# Patient Record
Sex: Female | Born: 1945 | Race: White | Hispanic: No | Marital: Married | Smoking: Never smoker
Health system: Southern US, Community
[De-identification: ages and names within clinical notes are randomized; demographics above are authoritative.]

## PROBLEM LIST (undated history)

## (undated) DIAGNOSIS — I442 Atrioventricular block, complete: Secondary | ICD-10-CM

## (undated) DIAGNOSIS — E119 Type 2 diabetes mellitus without complications: Secondary | ICD-10-CM

## (undated) DIAGNOSIS — K219 Gastro-esophageal reflux disease without esophagitis: Secondary | ICD-10-CM

## (undated) DIAGNOSIS — I495 Sick sinus syndrome: Secondary | ICD-10-CM

## (undated) DIAGNOSIS — R42 Dizziness and giddiness: Secondary | ICD-10-CM

## (undated) DIAGNOSIS — I447 Left bundle-branch block, unspecified: Secondary | ICD-10-CM

## (undated) DIAGNOSIS — Z45018 Encounter for adjustment and management of other part of cardiac pacemaker: Secondary | ICD-10-CM

## (undated) DIAGNOSIS — R55 Syncope and collapse: Secondary | ICD-10-CM

## (undated) DIAGNOSIS — T50905A Adverse effect of unspecified drugs, medicaments and biological substances, initial encounter: Secondary | ICD-10-CM

## (undated) DIAGNOSIS — T82190A Other mechanical complication of cardiac electrode, initial encounter: Secondary | ICD-10-CM

## (undated) DIAGNOSIS — Z950 Presence of cardiac pacemaker: Secondary | ICD-10-CM

## (undated) DIAGNOSIS — M48061 Spinal stenosis, lumbar region without neurogenic claudication: Secondary | ICD-10-CM

## (undated) DIAGNOSIS — E875 Hyperkalemia: Secondary | ICD-10-CM

## (undated) DIAGNOSIS — R202 Paresthesia of skin: Secondary | ICD-10-CM

## (undated) DIAGNOSIS — I10 Essential (primary) hypertension: Secondary | ICD-10-CM

## (undated) DIAGNOSIS — H409 Unspecified glaucoma: Secondary | ICD-10-CM

## (undated) DIAGNOSIS — F419 Anxiety disorder, unspecified: Secondary | ICD-10-CM

## (undated) DIAGNOSIS — M069 Rheumatoid arthritis, unspecified: Secondary | ICD-10-CM

## (undated) HISTORY — DX: Spinal stenosis, lumbar region without neurogenic claudication: M48.061

## (undated) HISTORY — DX: Atrioventricular block, complete: I44.2

## (undated) HISTORY — DX: Dizziness and giddiness: R42

## (undated) HISTORY — DX: Left bundle-branch block, unspecified: I44.7

## (undated) HISTORY — DX: Presence of cardiac pacemaker: Z95.0

## (undated) HISTORY — DX: Adverse effect of unspecified drugs, medicaments and biological substances, initial encounter: T50.905A

## (undated) HISTORY — DX: Syncope and collapse: R55

## (undated) HISTORY — PX: BLADDER SURGERY: SHX569

## (undated) HISTORY — DX: Other mechanical complication of cardiac electrode, initial encounter: T82.190A

## (undated) HISTORY — DX: Type 2 diabetes mellitus without complications: E11.9

## (undated) HISTORY — DX: Hyperkalemia: E87.5

## (undated) HISTORY — DX: Anxiety disorder, unspecified: F41.9

## (undated) HISTORY — DX: Unspecified glaucoma: H40.9

## (undated) HISTORY — PX: EYE SURGERY: SHX253

## (undated) HISTORY — DX: Essential (primary) hypertension: I10

## (undated) HISTORY — DX: Paresthesia of skin: R20.2

## (undated) HISTORY — DX: Rheumatoid arthritis, unspecified: M06.9

## (undated) HISTORY — DX: Gastro-esophageal reflux disease without esophagitis: K21.9

---
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
## 1999-11-06 ENCOUNTER — Other Ambulatory Visit: Admission: RE | Admit: 1999-11-06 | Discharge: 1999-11-06 | Payer: Self-pay | Admitting: Gynecology

## 2000-12-03 ENCOUNTER — Other Ambulatory Visit: Admission: RE | Admit: 2000-12-03 | Discharge: 2000-12-03 | Payer: Self-pay | Admitting: Obstetrics and Gynecology

## 2000-12-09 ENCOUNTER — Encounter: Payer: Self-pay | Admitting: Obstetrics and Gynecology

## 2000-12-09 ENCOUNTER — Encounter: Admission: RE | Admit: 2000-12-09 | Discharge: 2000-12-09 | Payer: Self-pay | Admitting: Obstetrics and Gynecology

## 2001-12-31 ENCOUNTER — Other Ambulatory Visit: Admission: RE | Admit: 2001-12-31 | Discharge: 2001-12-31 | Payer: Self-pay | Admitting: Obstetrics and Gynecology

## 2002-01-15 ENCOUNTER — Encounter: Payer: Self-pay | Admitting: Urology

## 2002-01-19 ENCOUNTER — Observation Stay (HOSPITAL_COMMUNITY): Admission: RE | Admit: 2002-01-19 | Discharge: 2002-01-20 | Payer: Self-pay | Admitting: Urology

## 2002-03-11 ENCOUNTER — Encounter: Admission: RE | Admit: 2002-03-11 | Discharge: 2002-03-11 | Payer: Self-pay | Admitting: Family Medicine

## 2002-03-11 ENCOUNTER — Encounter: Payer: Self-pay | Admitting: Family Medicine

## 2003-01-19 ENCOUNTER — Other Ambulatory Visit: Admission: RE | Admit: 2003-01-19 | Discharge: 2003-01-19 | Payer: Self-pay | Admitting: Obstetrics and Gynecology

## 2004-01-20 ENCOUNTER — Other Ambulatory Visit: Admission: RE | Admit: 2004-01-20 | Discharge: 2004-01-20 | Payer: Self-pay | Admitting: Obstetrics and Gynecology

## 2005-02-08 ENCOUNTER — Other Ambulatory Visit: Admission: RE | Admit: 2005-02-08 | Discharge: 2005-02-08 | Payer: Self-pay | Admitting: Obstetrics and Gynecology

## 2005-08-22 ENCOUNTER — Emergency Department (HOSPITAL_COMMUNITY): Admission: EM | Admit: 2005-08-22 | Discharge: 2005-08-22 | Payer: Self-pay | Admitting: Emergency Medicine

## 2005-09-12 ENCOUNTER — Encounter: Admission: RE | Admit: 2005-09-12 | Discharge: 2005-09-12 | Payer: Self-pay | Admitting: *Deleted

## 2007-08-25 ENCOUNTER — Encounter: Admission: RE | Admit: 2007-08-25 | Discharge: 2007-08-25 | Payer: Self-pay | Admitting: Gastroenterology

## 2007-09-01 ENCOUNTER — Encounter: Admission: RE | Admit: 2007-09-01 | Discharge: 2007-09-01 | Payer: Self-pay | Admitting: Gastroenterology

## 2008-05-05 ENCOUNTER — Encounter: Admission: RE | Admit: 2008-05-05 | Discharge: 2008-05-05 | Payer: Self-pay | Admitting: Rheumatology

## 2008-05-10 ENCOUNTER — Encounter: Admission: RE | Admit: 2008-05-10 | Discharge: 2008-05-10 | Payer: Self-pay | Admitting: Rheumatology

## 2008-07-28 ENCOUNTER — Encounter: Admission: RE | Admit: 2008-07-28 | Discharge: 2008-07-28 | Payer: Self-pay | Admitting: Unknown Physician Specialty

## 2009-12-04 ENCOUNTER — Emergency Department (HOSPITAL_COMMUNITY): Admission: EM | Admit: 2009-12-04 | Discharge: 2009-12-04 | Payer: Self-pay | Admitting: Emergency Medicine

## 2010-04-25 ENCOUNTER — Encounter
Admission: RE | Admit: 2010-04-25 | Discharge: 2010-05-29 | Payer: Self-pay | Source: Home / Self Care | Attending: Unknown Physician Specialty | Admitting: Unknown Physician Specialty

## 2010-05-08 ENCOUNTER — Encounter
Admission: RE | Admit: 2010-05-08 | Discharge: 2010-05-08 | Payer: Self-pay | Source: Home / Self Care | Attending: Obstetrics and Gynecology | Admitting: Obstetrics and Gynecology

## 2010-08-24 ENCOUNTER — Ambulatory Visit
Admission: RE | Admit: 2010-08-24 | Discharge: 2010-08-24 | Disposition: A | Discharge status: Home / Self Care | Payer: Medicare Other | Source: Ambulatory Visit | Attending: Emergency Medicine | Admitting: Emergency Medicine

## 2010-08-24 ENCOUNTER — Other Ambulatory Visit: Payer: Self-pay | Admitting: Emergency Medicine

## 2010-08-24 DIAGNOSIS — R053 Chronic cough: Secondary | ICD-10-CM

## 2010-08-24 DIAGNOSIS — R05 Cough: Secondary | ICD-10-CM

## 2010-09-14 NOTE — Op Note (Signed)
NAME:  Charlene Barker, Charlene Barker                         ACCOUNT NO.:  [id number]   MEDICAL RECORD NO.:  [id number]                   PATIENT TYPE:  AMB   LOCATION:  DAY                                  FACILITY:  [facility]   PHYSICIAN:  Crecencio Mc, M.D.                    DATE OF BIRTH:  1945/08/10   DATE OF PROCEDURE:  01/19/2002  DATE OF DISCHARGE:                                 OPERATIVE REPORT   PREOPERATIVE DIAGNOSES:  1. Stress urinary incontinence.  2. Anterior vaginal vault prolapse.   POSTOPERATIVE DIAGNOSES:  1. Stress urinary incontinence.  2. Anterior vaginal vault prolapse.   PROCEDURE:  1. Cystoscopy.  2. SPARC urethral sling.  3. Anterior vaginal repair.   SURGEON:  Sigmund I. Patsi Sears, M.D.   ASSISTANT:  Crecencio Mc, M.D.   ANESTHESIA:  General.   COMPLICATIONS:  None.   INDICATIONS FOR PROCEDURE:  [name] is a 65 year old white female who was  recently evaluated in the urology office for complaints of stress urinary  incontinence. The patient was noted to have a positive Gaynell Face test as well  as positive Q-tip test. Urodynamics were performed which demonstrated a  compliant bladder with an abdominal leak point pressure of 60 cm of water.  After discussing treatment options, the patient elected to proceed with a  urethral sling. The potential risks and benefits of this procedure were  explained to the patient and she consented.   DESCRIPTION OF PROCEDURE:  The patient was taken to the operating room and a  general anesthetic was administered. The patient was placed in the dorsal  lithotomy position, administered preoperative antibiotics, and prepped and  draped in the usual sterile fashion. Next a 16 French Foley catheter was  inserted into the bladder, the bladder was drained. The vaginal mucosa was  then injected with 10 cc of 0.5% Marcaine with 1% epinephrine. An incision  was then made over the vaginal mucosa overlying the mid urethra. This  incision was  then carried laterally on either side of the urethra underneath  the vaginal mucosa until the pubic bone could be palpated. Two stab  incisions were then made with a #10 blade, the lower anterior abdominal wall  on either side of the midline just above the pubis. The [facility] trocars were  then passed from the anterior abdominal  wall through the anterior rectus  fascia and behind the pubic bone and carried down through the vaginal  incision under digital guidance. This was also performed on the  contralateral side. The polypropylene urethral sling was then fastened to  these trocars and they were brought back up through the abdominal incision.  A right angle was placed between the sling in the urethra so as to prevent  the sling from being pulled too tightly. The plastic sheathing was then  removed and the sling was cut at the level of the skin. Of  note, cystoscopy  was performed both after placement of the trocars and after the sling was  passed and there was no evidence of any bladder or urethral injury. The  vaginal incision was then closed with a running 3-0 Vicryl stitch. Attention  was then turned to the patient's cystocele which appeared to be a grade 2  cystocele. A #15 blade was used to make a midline incision in the anterior  vaginal mucosa. The vaginal mucosa was then dissected free from the  underlying tissue on either side of the midline and the bladder was able to  be pushed away from the vaginal tissue. Once the dissection was adequate on  both sides laterally as well as posteriorly, interrupted 3-0 Vicryl sutures  were placed into the pubocervical fascia. In addition, a 2 x 7 cm piece of  Tutoplast fascia was placed between the bladder and these interrupted  sutures. The vaginal mucosa was then reapproximated with a running 2-0  Vicryl stitch after a small amount of vaginal mucosa was excised on either  side of the midline incision. A vaginal packing with Estrace cream was  then  placed in the vagina. The patient's Foley catheter was replaced and the stab  incisions in the lower abdomen were closed with Benzoin and Steri-Strips.  The patient appeared to tolerate the procedure well without complications.  She was able to be transferred to the recovery unit in satisfactory  condition. Please note that Dr.  Jethro Bolus was the operating  surgeon and was present and participated in this entire procedure.                                                Crecencio Mc, M.D.    LB/MEDQ  D:  01/19/2002  T:  01/19/2002  Job:  [id number]

## 2011-06-25 DIAGNOSIS — H04129 Dry eye syndrome of unspecified lacrimal gland: Secondary | ICD-10-CM | POA: Diagnosis not present

## 2011-06-25 DIAGNOSIS — H4011X0 Primary open-angle glaucoma, stage unspecified: Secondary | ICD-10-CM | POA: Diagnosis not present

## 2011-06-25 DIAGNOSIS — H409 Unspecified glaucoma: Secondary | ICD-10-CM | POA: Diagnosis not present

## 2011-07-17 DIAGNOSIS — I10 Essential (primary) hypertension: Secondary | ICD-10-CM | POA: Diagnosis not present

## 2011-07-17 DIAGNOSIS — E559 Vitamin D deficiency, unspecified: Secondary | ICD-10-CM | POA: Diagnosis not present

## 2011-07-17 DIAGNOSIS — E785 Hyperlipidemia, unspecified: Secondary | ICD-10-CM | POA: Diagnosis not present

## 2011-07-17 DIAGNOSIS — R42 Dizziness and giddiness: Secondary | ICD-10-CM | POA: Diagnosis not present

## 2011-07-17 DIAGNOSIS — R55 Syncope and collapse: Secondary | ICD-10-CM | POA: Diagnosis not present

## 2011-07-17 DIAGNOSIS — Z79899 Other long term (current) drug therapy: Secondary | ICD-10-CM | POA: Diagnosis not present

## 2011-07-19 DIAGNOSIS — I10 Essential (primary) hypertension: Secondary | ICD-10-CM | POA: Diagnosis not present

## 2011-07-19 DIAGNOSIS — R55 Syncope and collapse: Secondary | ICD-10-CM | POA: Diagnosis not present

## 2011-07-19 DIAGNOSIS — I447 Left bundle-branch block, unspecified: Secondary | ICD-10-CM | POA: Diagnosis not present

## 2011-07-23 ENCOUNTER — Encounter: Payer: Self-pay | Admitting: Internal Medicine

## 2011-07-29 ENCOUNTER — Ambulatory Visit (INDEPENDENT_AMBULATORY_CARE_PROVIDER_SITE_OTHER): Payer: TRICARE For Life (TFL) | Admitting: Internal Medicine

## 2011-07-29 ENCOUNTER — Encounter: Payer: Self-pay | Admitting: Internal Medicine

## 2011-07-29 VITALS — BP 126/74 | HR 71 | Ht 64.0 in | Wt 137.0 lb

## 2011-07-29 DIAGNOSIS — R55 Syncope and collapse: Secondary | ICD-10-CM | POA: Diagnosis not present

## 2011-07-29 DIAGNOSIS — R42 Dizziness and giddiness: Secondary | ICD-10-CM

## 2011-07-29 DIAGNOSIS — I447 Left bundle-branch block, unspecified: Secondary | ICD-10-CM

## 2011-07-29 NOTE — Assessment & Plan Note (Signed)
We will anticipate undertaken it exercise test to see if we can elucidate what is The hemodynamics and heart rhythm associated with her dizziness. She has dizziness even at rest. The cause of this is not clear. It was unassociated with blood pressure and/or heart rate abnormalities today.

## 2011-07-29 NOTE — Progress Notes (Signed)
CARDIOLOGY CONSULT NOTE  Patient ID: Charlene Barker, MRN: [id number], DOB/AGE: [birth year] 66 y.o. Admit date: (Not on file) Date of Consult: 07/29/2011  Primary Physician: Alva Garnet., MD, MD Primary Cardiologist: Ottis Stain  Chief Complaint: Syncope   HPI Charlene Barker is a 66 y.o. female : seen for syncope    Has longstanding history of LBBB and 3-5 yr history of increasingly frequent syncope  Abrupt in onset and offset and very brief in duration  Now 3 spells in the last three months  Eval has incl an echo prelim by Dr Ottis Stain office "normal"  Also has history of exercise assoc LH and Palplitations  The patient denies chest pain, shortness of breath, nocturnal dyspnea, orthopnea or peripheral edema.  T   She has hx of glaucoma and on multiple meds for this    Past Medical History  Diagnosis Date  . HTN (hypertension)   . Syncope       Surgical History:  Past Surgical History  Procedure Date  . Bladder surgery      Home Meds: Prior to Admission medications   Medication Sig Start Date End Date Taking? Authorizing Provider  Ascorbic Acid (VITAMIN C) 1000 MG tablet Take 1,000 mg by mouth daily.   Yes Historical Provider, MD  aspirin 81 MG tablet Take 81 mg by mouth daily.   Yes Historical Provider, MD  Biotin 1000 MCG tablet Take 1,000 mcg by mouth daily.   Yes Historical Provider, MD  Cholecalciferol (VITAMIN D3) 2000 UNITS capsule Take 2,000 Units by mouth daily.   Yes Historical Provider, MD  dorzolamide (TRUSOPT) 2 % ophthalmic solution 1 drop as directed.   Yes Historical Provider, MD  ibuprofen (ADVIL,MOTRIN) 200 MG tablet Take 200 mg by mouth as needed.   Yes Historical Provider, MD  latanoprost (XALATAN) 0.005 % ophthalmic solution 1 drop as directed.   Yes Historical Provider, MD  losartan (COZAAR) 50 MG tablet Take 50 mg by mouth daily.   Yes Historical Provider, MD  meclizine (ANTIVERT) 25 MG tablet Take 25 mg by mouth as needed.   Yes Historical Provider, MD    Multiple Vitamin (MULTIVITAMIN) capsule Take 1 capsule by mouth daily.   Yes Historical Provider, MD  Propylene Glycol (SYSTANE BALANCE) 0.6 % SOLN Apply to eye as directed.   Yes Historical Provider, MD  timolol (BETIMOL) 0.5 % ophthalmic solution 1 drop as directed.   Yes Historical Provider, MD    Inpatient Medications:     Allergies:  Allergies  Allergen Reactions  . Penicillins   . Sulfa Drugs Cross Reactors     History   Social History  . Marital Status: Married    Spouse Name: N/A    Number of Children: N/A  . Years of Education: N/A   Occupational History  . Not on file.   Social History Main Topics  . Smoking status: Never Smoker   . Smokeless tobacco: Not on file  . Alcohol Use: Not on file  . Drug Use: No  . Sexually Active: Not on file   Other Topics Concern  . Not on file   Social History Narrative  . No narrative on file     Family History  Problem Relation Age of Onset  . Leukemia Mother   . Stroke Father      ROS:  Please see the history of present illness.   Negative except anxiety allergies  All other systems reviewed and negative.    Physical Exam: Blood pressure  126/74, pulse 71, height 5\' 4"  (1.626 m), weight 137 lb (62.143 kg). General: Well developed, well nourished female in no acute distress. Head: Normocephalic, atraumatic, sclera non-icteric, no xanthomas, nares are without discharge. Lymph Nodes:  none Neck: Negative for carotid bruits. JVD not elevated. Lungs: Clear bilaterally to auscultation without wheezes, rales, or rhonchi. Breathing is unlabored. Heart: RRR with S1 S2 broadly split . No murmurs, rubs, or gallops appreciated. Abdomen: Soft, non-tender, non-distended with normoactive bowel sounds. No hepatomegaly. No rebound/guarding. No obvious abdominal masses. Msk:  Strength and tone appear normal for age. Extremities: No clubbing or cyanosis. No edema.  Distal pedal pulses are 2+ and equal bilaterally. Skin: Warm and  Dry Neuro: Alert and oriented X 3. CN III-XII intact Grossly normal sensory and motor function . Psych:  Responds to questions appropriately with a normal affect.      Labs:  EKG: LBBB  NSR    Assessment and Plan:  [name]

## 2011-07-29 NOTE — Assessment & Plan Note (Signed)
She has abrupt onset and offset very brief episodes of syncope and loss of postural tone. In the context of her left bundle branch block, question must be raised as to whether she has intermittent complete heart block. These episodes are increasingly frequent and I hope to be able to identify an episode with an event recorder. If this is not illuminating, we will undertake an implantable device.

## 2011-07-29 NOTE — Assessment & Plan Note (Signed)
As above.

## 2011-07-29 NOTE — Patient Instructions (Signed)
Your physician has requested that you have an exercise tolerance test. For further information please visit [email or web address] Please also follow instruction sheet, as given.    Your physician has recommended that you wear an event monitor. Event monitors are medical devices that record the heart's electrical activity. Doctors most often [place] these monitors to diagnose arrhythmias. Arrhythmias are problems with the speed or rhythm of the heartbeat. The monitor is a small, portable device. You can wear one while you do your normal daily activities. This is usually used to diagnose what is causing palpitations/syncope (passing out).----Charlene Barker of HEART[facility] single event

## 2011-08-08 ENCOUNTER — Encounter (INDEPENDENT_AMBULATORY_CARE_PROVIDER_SITE_OTHER): Payer: Medicare Other

## 2011-08-08 DIAGNOSIS — R55 Syncope and collapse: Secondary | ICD-10-CM | POA: Diagnosis not present

## 2011-08-20 DIAGNOSIS — R55 Syncope and collapse: Secondary | ICD-10-CM | POA: Diagnosis not present

## 2011-08-20 DIAGNOSIS — I447 Left bundle-branch block, unspecified: Secondary | ICD-10-CM | POA: Diagnosis not present

## 2011-08-20 DIAGNOSIS — I10 Essential (primary) hypertension: Secondary | ICD-10-CM | POA: Diagnosis not present

## 2011-08-23 DIAGNOSIS — R42 Dizziness and giddiness: Secondary | ICD-10-CM | POA: Diagnosis not present

## 2011-08-26 ENCOUNTER — Ambulatory Visit (INDEPENDENT_AMBULATORY_CARE_PROVIDER_SITE_OTHER): Payer: BC Managed Care – PPO | Admitting: Internal Medicine

## 2011-08-26 ENCOUNTER — Encounter: Payer: Self-pay | Admitting: Internal Medicine

## 2011-08-26 DIAGNOSIS — R55 Syncope and collapse: Secondary | ICD-10-CM

## 2011-08-26 NOTE — Procedures (Signed)
Exercise Treadmill Test  Pre-Exercise Testing Evaluation Rhythm: normal sinus  Rate: 78   PR:  .17 QRS:  .12  QT:  .40 QTc: .46     Test  Exercise Tolerance Test Ordering MD: [name], MD  Interpreting MD:  [name], MD  Unique Test No: 1  Treadmill:  1  Indication for ETT: Syncope  Contraindication to ETT: No   Stress Modality: exercise - treadmill  Cardiac Imaging Performed: non   Protocol: standard Bruce - maximal  Max BP: 174/66  Max MPHR (bpm):  154 85% MPR (bpm):  131  MPHR obtained (bpm):  137 % MPHR obtained:  88%  Reached 85% MPHR (min:sec):  3:20 Total Exercise Time (min-sec):  4:09  Workload in METS:  7.1 Borg Scale: 15  Reason ETT Terminated:  unable to continue  with some lightheadedness    ST Segment Analysis At Rest: significant ST depression making ST analysis non-diagnostic With Exercise: non-specific ST changes  Other Information Arrhythmia:  occ pvc Angina during ETT:  absent (0) Quality of ETT:  diagnostic  ETT Interpretation:  Increased ventricular ectopy w exercise  Comments: pvc density low but emerged with exercise and couplet seen at peak exercise which was alsoassociated with inability to sustain increasing blood pressure during exercise  Recommendations: Need to exclude coronary artery disease  Will discuss with Dr Ottis Stain Donnie Mesa or cath

## 2011-09-10 ENCOUNTER — Telehealth: Payer: Self-pay | Admitting: Physician Assistant

## 2011-09-10 ENCOUNTER — Encounter: Payer: Self-pay | Admitting: Family Medicine

## 2011-09-10 ENCOUNTER — Ambulatory Visit (INDEPENDENT_AMBULATORY_CARE_PROVIDER_SITE_OTHER): Payer: BC Managed Care – PPO | Admitting: Family Medicine

## 2011-09-10 VITALS — BP 145/77 | HR 78 | Temp 97.8°F | Resp 16 | Ht 63.5 in | Wt 147.0 lb

## 2011-09-10 DIAGNOSIS — R55 Syncope and collapse: Secondary | ICD-10-CM

## 2011-09-10 DIAGNOSIS — H409 Unspecified glaucoma: Secondary | ICD-10-CM | POA: Diagnosis not present

## 2011-09-10 DIAGNOSIS — H04129 Dry eye syndrome of unspecified lacrimal gland: Secondary | ICD-10-CM | POA: Diagnosis not present

## 2011-09-10 DIAGNOSIS — H4011X0 Primary open-angle glaucoma, stage unspecified: Secondary | ICD-10-CM | POA: Diagnosis not present

## 2011-09-10 LAB — GLUCOSE, POCT (MANUAL RESULT ENTRY): POC Glucose: 107

## 2011-09-10 NOTE — Telephone Encounter (Signed)
Pt called because she had a syncopal spell. She turned in her monitor 2 days ago. She had a brief prodrome and was able to keep from falling. She feels generally bad now and is at Urgent Care.  Advised her to let them see her and they will call us if anything needs to be addressed acutely. Otherwise, keep appointment with Dr Graciela Husbands and call us if any other symptoms. Requested she not drive.

## 2011-09-10 NOTE — Progress Notes (Signed)
  Subjective:    Patient ID: Charlene Barker, female    DOB: [birth year], 66 y.o.   MRN: [id number]  HPI 66 yo female following with DR. Graciela Husbands of [facility] for frequent and recurring syncopal episodes, LBBB.  They are trying to determine the cause of her spells.  Did wear a monitor but turned it in yesterday.  Has follow-up appointment with Dr. Graciela Husbands on Thursday (day after tomrrow, in the morning). Today, when getting out of the car she had the spell.  VEry brief.  "almost over as soon as it begins" but feels dizzy and lightheaded and "goes down" but doesn't fully lose consciousness because so brief.   Has felt off this afternoon. Did go into [place] for eye appt with her husband.  Had not had dinner yet.   No pain or shortness of breath.    Review of Systems Negative except as per HPI     Objective:   Physical Exam  Constitutional: She appears well-developed and well-nourished.  Cardiovascular: Normal rate, regular rhythm, normal heart sounds and intact distal pulses.   No murmur heard. Pulmonary/Chest: Effort normal and breath sounds normal.  Neurological: She is alert.  Skin: Skin is warm and dry.    Results for orders placed in visit on 09/10/11  GLUCOSE, POCT (MANUAL RESULT ENTRY)      Component Value Range   POC Glucose 107      EKG - LBBB and LAD     Assessment & Plan:  Syncope - not new.  In middle of thorough work-up with cardiology with follow-up scheduled in less than 48 hours.  No driving until seen by cards.  Nothing acute seen tonight. Keep scheduled follow-up with Dr. Graciela Husbands.

## 2011-09-11 ENCOUNTER — Encounter: Payer: Self-pay | Admitting: Internal Medicine

## 2011-09-11 ENCOUNTER — Ambulatory Visit (INDEPENDENT_AMBULATORY_CARE_PROVIDER_SITE_OTHER): Payer: BC Managed Care – PPO | Admitting: Internal Medicine

## 2011-09-11 VITALS — BP 110/66 | HR 66 | Ht 64.0 in | Wt 139.0 lb

## 2011-09-11 DIAGNOSIS — I447 Left bundle-branch block, unspecified: Secondary | ICD-10-CM | POA: Diagnosis not present

## 2011-09-11 DIAGNOSIS — R55 Syncope and collapse: Secondary | ICD-10-CM

## 2011-09-11 NOTE — Progress Notes (Signed)
  HPI  Charlene Barker is a 65 y.o. female SEEN in followup for  longstanding history of LBBB and 3-5 yr history of increasingly frequent syncope  Abrupt in onset and offset and very brief in duration  Now 3 spells in the last three months  2013 echo prelim by Dr JG office "normal"    Event recorder demonstarated only PVCs with LH but no severe or typical spells while wearing it  Review of spells includes positions are variable, residual fatigue and orthostatic intolerance is common    Past Medical History  Diagnosis Date  . HTN (hypertension)   . Syncope   . Left bundle branch block   . Lightheadedness     Associated with exercise    Past Surgical History  Procedure Date  . Bladder surgery     Current Outpatient Prescriptions  Medication Sig Dispense Refill  . Ascorbic Acid (VITAMIN C) 1000 MG tablet Take 1,000 mg by mouth daily.      . aspirin 81 MG tablet Take 81 mg by mouth daily.      . Biotin 1000 MCG tablet Take 1,000 mcg by mouth daily.      . Cholecalciferol (VITAMIN D3) 2000 UNITS capsule Take 2,000 Units by mouth daily.      . dorzolamide (TRUSOPT) 2 % ophthalmic solution 1 drop as directed.      . Green Coffee Bean 400 MG CAPS Take 1 tablet by mouth.      . ibuprofen (ADVIL,MOTRIN) 200 MG tablet Take 200 mg by mouth as needed.      . latanoprost (XALATAN) 0.005 % ophthalmic solution 1 drop as directed.      . losartan (COZAAR) 50 MG tablet Take 50 mg by mouth daily.      . meclizine (ANTIVERT) 25 MG tablet Take 25 mg by mouth as needed.      . Multiple Vitamin (MULTIVITAMIN) capsule Take 1 capsule by mouth daily.      . Propylene Glycol (SYSTANE BALANCE) 0.6 % SOLN Apply to eye as directed.      . timolol (BETIMOL) 0.5 % ophthalmic solution 1 drop as directed.        Allergies  Allergen Reactions  . Penicillins   . Sulfa Drugs Cross Reactors     Review of Systems negative except from HPI and PMH  Physical Exam BP 110/66  Pulse 66  Ht 5' 4" (1.626 m)   Wt 139 lb (63.05 kg)  BMI 23.86 kg/m2 Well developed and well nourished in no acute distress HENT normal E scleral and icterus clear Neck Supple JVP flat; carotids brisk and full Clear to ausculation Regular rate and rhythm, no murmurs gallops or rub Soft with active bowel sounds No clubbing cyanosis none Edema Alert and oriented, grossly normal motor and sensory function Skin Warm and Dry  Tele  NSR with PVC both pre and post symptom mark  Assessment and  Plan  

## 2011-09-11 NOTE — Assessment & Plan Note (Signed)
We discussed multiple options -- carotid sinus massage >>3sec pause and she also had passed out with Dr Ottis Stain with [facility] but her symtpoms are not consistent with this diagnosis so I would still favor loop recorder  We discussed these issue for almost 45 minutes  She will considr loop implant and we will followupwith her in the am

## 2011-09-11 NOTE — Assessment & Plan Note (Signed)
As above  ILR prior to pacing    Risks disucssed including infection

## 2011-09-12 ENCOUNTER — Ambulatory Visit: Payer: BC Managed Care – PPO | Admitting: Internal Medicine

## 2011-09-12 ENCOUNTER — Encounter: Payer: Self-pay | Admitting: *Deleted

## 2011-09-12 ENCOUNTER — Telehealth: Payer: Self-pay | Admitting: Internal Medicine

## 2011-09-12 DIAGNOSIS — R55 Syncope and collapse: Secondary | ICD-10-CM

## 2011-09-12 NOTE — Telephone Encounter (Signed)
New msg Pt was here yesterday and wanted to talk to you. Please call

## 2011-09-12 NOTE — Telephone Encounter (Signed)
I called and spoke with the patient to set up her loop recorder. She is scheduled for 5/24 at 12:30 pm with Dr. Graciela Husbands. She will come on 5/20 for labwork.

## 2011-09-13 ENCOUNTER — Encounter (HOSPITAL_COMMUNITY): Payer: Self-pay | Admitting: Pharmacy Technician

## 2011-09-16 ENCOUNTER — Other Ambulatory Visit (INDEPENDENT_AMBULATORY_CARE_PROVIDER_SITE_OTHER): Payer: BC Managed Care – PPO

## 2011-09-16 DIAGNOSIS — R55 Syncope and collapse: Secondary | ICD-10-CM | POA: Diagnosis not present

## 2011-09-16 LAB — BASIC METABOLIC PANEL
BUN: 15 mg/dL (ref 6–23)
CO2: 29 mEq/L (ref 19–32)
Calcium: 9.7 mg/dL (ref 8.4–10.5)
Chloride: 103 mEq/L (ref 96–112)
Creatinine, Ser: 0.7 mg/dL (ref 0.4–1.2)
GFR: 86 mL/min (ref >60.00)
Glucose, Bld: 117 mg/dL — ABNORMAL HIGH (ref 70–99)
Potassium: 4.9 mEq/L (ref 3.5–5.1)
Sodium: 139 mEq/L (ref 135–145)

## 2011-09-16 LAB — CBC WITH DIFFERENTIAL/PLATELET
Basophils Absolute: 0.1 10*3/uL (ref 0.0–0.1)
Basophils Relative: 0.8 % (ref 0.0–3.0)
Eosinophils Absolute: 0.3 10*3/uL (ref 0.0–0.7)
Eosinophils Relative: 4.5 % (ref 0.0–5.0)
HCT: 39.1 % (ref 36.0–46.0)
Hemoglobin: 13 g/dL (ref 12.0–15.0)
Lymphocytes Relative: 28.9 % (ref 12.0–46.0)
Lymphs Abs: 2.1 10*3/uL (ref 0.7–4.0)
MCHC: 33.1 g/dL (ref 30.0–36.0)
MCV: 94.5 fl (ref 78.0–100.0)
Monocytes Absolute: 0.6 10*3/uL (ref 0.1–1.0)
Monocytes Relative: 8.6 % (ref 3.0–12.0)
Neutro Abs: 4.1 10*3/uL (ref 1.4–7.7)
Neutrophils Relative %: 57.2 % (ref 43.0–77.0)
Platelets: 275 10*3/uL (ref 150.0–400.0)
RBC: 4.14 Mil/uL (ref 3.87–5.11)
RDW: 12.3 % (ref 11.5–14.6)
WBC: 7.2 10*3/uL (ref 4.5–10.5)

## 2011-09-16 LAB — PROTIME-INR
INR: 0.9 ratio (ref 0.8–1.0)
Prothrombin Time: 10.3 s (ref 10.2–12.4)

## 2011-09-18 ENCOUNTER — Telehealth: Payer: Self-pay | Admitting: Internal Medicine

## 2011-09-18 NOTE — Telephone Encounter (Signed)
Patient called states forgot to tell Dr.Klein she takes ear gtts.prn.States takes neomycin polymyxin b sulfates dexamethasone opthalmic suspension applies 4 gtts in each ear prn.

## 2011-09-18 NOTE — Telephone Encounter (Signed)
New msg Pt wants to talk to you about med she uses for her ears. She is having procedure-loop recorder on Friday. She wanted to add to her list of meds.

## 2011-09-19 ENCOUNTER — Telehealth: Payer: Self-pay | Admitting: Internal Medicine

## 2011-09-19 ENCOUNTER — Other Ambulatory Visit: Payer: Self-pay | Admitting: Internal Medicine

## 2011-09-19 DIAGNOSIS — R55 Syncope and collapse: Secondary | ICD-10-CM

## 2011-09-19 NOTE — Telephone Encounter (Signed)
New problem:  Patient calling, wants to discuss upcoming procedure & pre-op clearance

## 2011-09-19 NOTE — Telephone Encounter (Signed)
error 

## 2011-09-20 ENCOUNTER — Ambulatory Visit (HOSPITAL_COMMUNITY)
Admission: RE | Admit: 2011-09-20 | Discharge: 2011-09-20 | Disposition: A | Discharge status: Home / Self Care | Payer: BC Managed Care – PPO | Source: Ambulatory Visit | Attending: Internal Medicine | Admitting: Internal Medicine

## 2011-09-20 ENCOUNTER — Encounter (HOSPITAL_COMMUNITY)
Admission: RE | Disposition: A | Discharge status: Home / Self Care | Payer: Self-pay | Source: Ambulatory Visit | Attending: Internal Medicine

## 2011-09-20 ENCOUNTER — Ambulatory Visit (HOSPITAL_COMMUNITY): Payer: BC Managed Care – PPO

## 2011-09-20 DIAGNOSIS — R55 Syncope and collapse: Secondary | ICD-10-CM | POA: Insufficient documentation

## 2011-09-20 DIAGNOSIS — I447 Left bundle-branch block, unspecified: Secondary | ICD-10-CM | POA: Insufficient documentation

## 2011-09-20 DIAGNOSIS — I10 Essential (primary) hypertension: Secondary | ICD-10-CM | POA: Diagnosis not present

## 2011-09-20 HISTORY — PX: LOOP RECORDER IMPLANT: SHX5477

## 2011-09-20 LAB — SURGICAL PCR SCREEN
MRSA, PCR: POSITIVE — AB
Staphylococcus aureus: POSITIVE — AB

## 2011-09-20 SURGERY — LOOP RECORDER IMPLANT
Anesthesia: LOCAL

## 2011-09-20 MED ORDER — VANCOMYCIN HCL IN DEXTROSE 1-5 GM/200ML-% IV SOLN
INTRAVENOUS | Status: AC
  Filled 2011-09-20: qty 200

## 2011-09-20 MED ORDER — CHLORHEXIDINE GLUCONATE 4 % EX LIQD: 60.0000 mL | Freq: Once | CUTANEOUS | Status: DC

## 2011-09-20 MED ORDER — MUPIROCIN 2 % EX OINT
TOPICAL_OINTMENT | Freq: Two times a day (BID) | CUTANEOUS | Status: DC
  Administered 2011-09-20: 12:00:00 via NASAL
  Filled 2011-09-20: qty 22

## 2011-09-20 MED ORDER — VANCOMYCIN HCL IN DEXTROSE 1-5 GM/200ML-% IV SOLN
1000.0000 mg | INTRAVENOUS | Status: DC
  Filled 2011-09-20: qty 200

## 2011-09-20 MED ORDER — SODIUM CHLORIDE 0.9 % IR SOLN
80.0000 mg | Status: DC
  Filled 2011-09-20: qty 2

## 2011-09-20 MED ORDER — MUPIROCIN 2 % EX OINT
TOPICAL_OINTMENT | CUTANEOUS | Status: AC
  Filled 2011-09-20: qty 22

## 2011-09-20 MED ORDER — MIDAZOLAM HCL 2 MG/2ML IJ SOLN
INTRAMUSCULAR | Status: AC
  Filled 2011-09-20: qty 2

## 2011-09-20 MED ORDER — SODIUM CHLORIDE 0.45 % IV SOLN: INTRAVENOUS | Status: AC

## 2011-09-20 MED ORDER — FENTANYL CITRATE 0.05 MG/ML IJ SOLN
INTRAMUSCULAR | Status: AC
  Filled 2011-09-20: qty 2

## 2011-09-20 NOTE — CV Procedure (Signed)
Preop Dx: synope Postop Dx: same    LOOP RECORDER IMPLANT   After routine prep and drape of the left upper chest, lidocaine was infiltrated lateral to the sternum and caudal to the medial aspect of the clavicle. An incision was made and carried down to the layer of the prepectoral fascia and a Paco is formed. Hemostasis was obtained  2 2-0 silk sutures were placed at the cephalad aspect of the pocket and used to secure a reveal XT Medtronic loop recorder serial number [id number]. The pocket was copiously irrigated with antibiotic containing saline solution hemostasis was assured and the pocket was closed in 3 layers in the normal fashion.  The wound was washed dried and a benzoin Steri-Strip this was applied needle counts sponge counts and instrument counts were correct at the end of the procedure according to the staff. The patient tolerated the procedure without apparent complication.

## 2011-09-20 NOTE — Discharge Instructions (Signed)
LET YOUR CAREGIVER KNOW ABOUT:   Symptoms of chest pain, trouble breathing, palpitations, lightheadedness, or feelings of an abnormal or irregular heart beat.   Allergies.   Medications taken including herbs, eye drops, over the counter medications, and creams   Use of steroids (by mouth or creams).   Possible pregnancy, if applicable.   Previous problems with anesthetics or Novocaine.   History of blood clots (thrombophlebitis).   History of bleeding or blood problems.   Surgery since your last pacemaker placement.   Other health problems.  RISKS AND COMPLICATIONS These are very uncommon but include:  Bleeding.   Bruising of the skin around where the incision was made.   Pain at the site of the incision.   Pulling apart of the skin at the incision site.   Infection.   Allergic reaction to anesthetics or medicines used during the procedure.  Diabetics may have a temporary increase in their blood sugar after any surgical procedure.  BEFORE THE PROCEDURE  Wash all of the skin around the area of the chest where the pacemaker is located. Try to remove any loose, scaling skin. Unless advised otherwise, avoid using aspirin, ibuprofen, or naproxen for 3-4 days before the procedure. Ask your caregiver for help with any other medication adjustments before the pacemaker is replaced. Unless advised otherwise, do not eat or drink after midnight on the night before the procedure EXCEPT for drinking water and taking your medications as you normally would. AFTER THE PROCEDURE   A heart monitor and the pacemaker programmer will be used to make sure that the new pacemaker is working properly.   You can go home after the procedure.   Your caregiver will advise you if you need to have any stitches. They will be removed 5-7 days after the procedure.  HOME CARE INSTRUCTIONS   Keep the incision clean and dry.   Unless advised otherwise, you may shower after carefully covering the incision  with plastic wrap that is taped to your chest.   For the first week after the replacement, avoid stretching motions that pull at the incision site and avoid heavy exercise with the arm on the same side as the incision.   Only take over-the-counter or prescription medicines for pain, discomfort, or fever as directed by your caregiver.   Your caregiver will tell you when you will need to next test your pacemaker by telephone or when to return to the office for re-exam and/or removal of stitches, if necessary.  SEEK MEDICAL CARE IF:   You have unusual pain at the incision site that is not adequately helped by over-the-counter or prescription medicine.   There is drainage or pus from the incision site.   You develop red streaking that extends above or below the incision site.   You feel brief intermittent palpitations, lightheadedness or any symptoms that you feel might be related to your heart.  SEEK IMMEDIATE MEDICAL CARE IF:   You experience chest pain that is different than the pain at the incision site.   You experience:   Shortness of breath.   Palpitations.   Irregular heart beat.   Lightheadedness that does not go away quickly.   Fainting.   You develop a fever.   You have pain that gets worse even though you are taking pain medicine.  MAKE SURE YOU:   Understand these instructions.   Will watch your condition.   Will get help right away if you are not doing well or get worse.  Document Released: 07/24/2006 Document Revised: 04/04/2011 Document Reviewed: 10/27/2006 [facility] Patient Information 2012 [place], [place].

## 2011-09-20 NOTE — H&P (View-Only) (Signed)
  HPI  Charlene Barker is a 66 y.o. female SEEN in followup for  longstanding history of LBBB and 3-5 yr history of increasingly frequent syncope  Abrupt in onset and offset and very brief in duration  Now 3 spells in the last three months  2013 echo prelim by Dr Ottis Stain office "normal"    Event recorder demonstarated only PVCs with LH but no severe or typical spells while wearing it  Review of spells includes positions are variable, residual fatigue and orthostatic intolerance is common    Past Medical History  Diagnosis Date  . HTN (hypertension)   . Syncope   . Left bundle branch block   . Lightheadedness     Associated with exercise    Past Surgical History  Procedure Date  . Bladder surgery     Current Outpatient Prescriptions  Medication Sig Dispense Refill  . Ascorbic Acid (VITAMIN C) 1000 MG tablet Take 1,000 mg by mouth daily.      Marland Kitchen aspirin 81 MG tablet Take 81 mg by mouth daily.      . Biotin 1000 MCG tablet Take 1,000 mcg by mouth daily.      . Cholecalciferol (VITAMIN D3) 2000 UNITS capsule Take 2,000 Units by mouth daily.      . dorzolamide (TRUSOPT) 2 % ophthalmic solution 1 drop as directed.      Chilton Si Coffee Bean 400 MG CAPS Take 1 tablet by mouth.      Marland Kitchen ibuprofen (ADVIL,MOTRIN) 200 MG tablet Take 200 mg by mouth as needed.      . latanoprost (XALATAN) 0.005 % ophthalmic solution 1 drop as directed.      Marland Kitchen losartan (COZAAR) 50 MG tablet Take 50 mg by mouth daily.      . meclizine (ANTIVERT) 25 MG tablet Take 25 mg by mouth as needed.      . Multiple Vitamin (MULTIVITAMIN) capsule Take 1 capsule by mouth daily.      Marland Kitchen Propylene Glycol (SYSTANE BALANCE) 0.6 % SOLN Apply to eye as directed.      . timolol (BETIMOL) 0.5 % ophthalmic solution 1 drop as directed.        Allergies  Allergen Reactions  . Penicillins   . Sulfa Drugs Cross Reactors     Review of Systems negative except from HPI and PMH  Physical Exam BP 110/66  Pulse 66  Ht 5\' 4"  (1.626 m)   Wt 139 lb (63.05 kg)  BMI 23.86 kg/m2 Well developed and well nourished in no acute distress HENT normal E scleral and icterus clear Neck Supple JVP flat; carotids brisk and full Clear to ausculation Regular rate and rhythm, no murmurs gallops or rub Soft with active bowel sounds No clubbing cyanosis none Edema Alert and oriented, grossly normal motor and sensory function Skin Warm and Dry  Tele  NSR with PVC both pre and post symptom mark  Assessment and  Plan

## 2011-09-20 NOTE — Interval H&P Note (Signed)
History and Physical Interval Note:  09/20/2011 12:14 PM  Charlene Barker  has presented today for surgery, with the diagnosis of lightheadness  The various methods of treatment have been discussed with the patient and family. After consideration of risks, benefits and other options for treatment, the patient has consented to  Procedure(s) (LRB): LOOP RECORDER IMPLANT (N/A) as a surgical intervention .  The patients' history has been reviewed, patient examined, no change in status, stable for surgery.  I have reviewed the patients' chart and labs.  Questions were answered to the patient's satisfaction.     [name]

## 2011-09-24 ENCOUNTER — Telehealth: Payer: Self-pay | Admitting: Internal Medicine

## 2011-09-24 NOTE — Telephone Encounter (Signed)
I spoke with the patient. She states she had an episode about 1 or 2 am this morning. She was getting ready to get up and felt like she might "go out." She did try to record on her monitor she reports. I explained I will forward to the device clinic. They may try to reach her tomorrow. If so, she would like to be contacted on her cell #.

## 2011-09-24 NOTE — Telephone Encounter (Signed)
New msg Pt said she had an episode last night. She just had loop recorder put in of Friday. Please call to discuss

## 2011-09-25 DIAGNOSIS — H43819 Vitreous degeneration, unspecified eye: Secondary | ICD-10-CM | POA: Diagnosis not present

## 2011-09-25 NOTE — Telephone Encounter (Signed)
Fu msg Pt had some questions about her loop recorder. Please call

## 2011-09-25 NOTE — Telephone Encounter (Signed)
Will have schedulers call to make appt with device clinic for tomorrow to review loop recorder.

## 2011-09-26 ENCOUNTER — Telehealth: Payer: Self-pay | Admitting: Internal Medicine

## 2011-09-26 NOTE — Telephone Encounter (Signed)
Patient got a recorded call from ?[employer] and was questioning if it was from [place].   She will follow up as scheduled 10/02/11.

## 2011-09-26 NOTE — Telephone Encounter (Signed)
F/u  Patient calling for status on f/u with Gunnar Fusi, she can be reached at [phone].

## 2011-09-26 NOTE — Telephone Encounter (Signed)
Pt would like to speak with paula has questions re device

## 2011-10-02 ENCOUNTER — Ambulatory Visit (INDEPENDENT_AMBULATORY_CARE_PROVIDER_SITE_OTHER): Payer: BC Managed Care – PPO | Admitting: *Deleted

## 2011-10-02 ENCOUNTER — Encounter: Payer: Self-pay | Admitting: Internal Medicine

## 2011-10-02 DIAGNOSIS — R55 Syncope and collapse: Secondary | ICD-10-CM

## 2011-10-02 LAB — PACEMAKER DEVICE OBSERVATION

## 2011-10-02 NOTE — Progress Notes (Signed)
Wound check-ILR 

## 2011-10-03 ENCOUNTER — Ambulatory Visit: Payer: BC Managed Care – PPO

## 2011-10-08 DIAGNOSIS — H409 Unspecified glaucoma: Secondary | ICD-10-CM | POA: Diagnosis not present

## 2011-10-08 DIAGNOSIS — H4011X0 Primary open-angle glaucoma, stage unspecified: Secondary | ICD-10-CM | POA: Diagnosis not present

## 2011-10-08 DIAGNOSIS — H259 Unspecified age-related cataract: Secondary | ICD-10-CM | POA: Diagnosis not present

## 2011-10-08 DIAGNOSIS — H04129 Dry eye syndrome of unspecified lacrimal gland: Secondary | ICD-10-CM | POA: Diagnosis not present

## 2011-10-29 DIAGNOSIS — R55 Syncope and collapse: Secondary | ICD-10-CM | POA: Diagnosis not present

## 2011-10-29 DIAGNOSIS — I10 Essential (primary) hypertension: Secondary | ICD-10-CM | POA: Diagnosis not present

## 2011-10-29 DIAGNOSIS — R609 Edema, unspecified: Secondary | ICD-10-CM | POA: Diagnosis not present

## 2011-10-29 DIAGNOSIS — F411 Generalized anxiety disorder: Secondary | ICD-10-CM | POA: Diagnosis not present

## 2011-11-19 ENCOUNTER — Telehealth: Payer: Self-pay | Admitting: Internal Medicine

## 2011-11-19 NOTE — Telephone Encounter (Signed)
Spoke w/pt---answered all questions about loop recorder and [facility]/kw[facility]

## 2011-11-19 NOTE — Telephone Encounter (Signed)
New msg Pt wants to talk to you about her loop recorder. Please call

## 2011-12-26 DIAGNOSIS — I447 Left bundle-branch block, unspecified: Secondary | ICD-10-CM | POA: Diagnosis not present

## 2011-12-26 DIAGNOSIS — R55 Syncope and collapse: Secondary | ICD-10-CM | POA: Diagnosis not present

## 2011-12-26 DIAGNOSIS — I10 Essential (primary) hypertension: Secondary | ICD-10-CM | POA: Diagnosis not present

## 2012-01-03 DIAGNOSIS — Z0000 Encounter for general adult medical examination without abnormal findings: Secondary | ICD-10-CM | POA: Diagnosis not present

## 2012-01-07 ENCOUNTER — Encounter: Payer: Self-pay | Admitting: Internal Medicine

## 2012-01-07 ENCOUNTER — Ambulatory Visit (INDEPENDENT_AMBULATORY_CARE_PROVIDER_SITE_OTHER): Payer: BC Managed Care – PPO | Admitting: Internal Medicine

## 2012-01-07 VITALS — BP 124/69 | HR 67 | Ht 67.0 in | Wt 142.8 lb

## 2012-01-07 DIAGNOSIS — Z959 Presence of cardiac and vascular implant and graft, unspecified: Secondary | ICD-10-CM

## 2012-01-07 DIAGNOSIS — R55 Syncope and collapse: Secondary | ICD-10-CM | POA: Diagnosis not present

## 2012-01-07 LAB — PACEMAKER DEVICE OBSERVATION

## 2012-01-07 NOTE — Assessment & Plan Note (Signed)
A interrogated without diagnostic episodes

## 2012-01-07 NOTE — Progress Notes (Signed)
Patient Care Team: Merlene Laughter. Renae Gloss, MD as PCP - General (Internal Medicine)   HPI  Charlene Barker is a 66 y.o. female Seen in followup for syncope foe which she is s/p loop recorder implant.  She has LBBB  Followed by Ottis Stain  She has had no intercurrent syncope.  Past Medical History  Diagnosis Date  . HTN (hypertension)   . Syncope   . Left bundle branch block   . Lightheadedness     Associated with exercise    Past Surgical History  Procedure Date  . Bladder surgery     Current Outpatient Prescriptions  Medication Sig Dispense Refill  . acebutolol (SECTRAL) 200 MG capsule Take 200 mg by mouth daily.      . Ascorbic Acid (VITAMIN C) 1000 MG tablet Take 1,000 mg by mouth daily.      Marland Kitchen aspirin 81 MG tablet Take 81 mg by mouth daily.      . Biotin 1000 MCG tablet Take 1,000 mcg by mouth daily.      Marland Kitchen CALCIUM-MAGNESIUM-ZINC PO Take 1 capsule by mouth daily.      . Cholecalciferol (VITAMIN D3) 2000 UNITS capsule Take 2,000 Units by mouth daily.      . dorzolamide-timolol (COSOPT) 22.3-6.8 MG/ML ophthalmic solution Place 1 drop into the left eye 2 (two) times daily.      Marland Kitchen ibuprofen (ADVIL,MOTRIN) 200 MG tablet Take 200 mg by mouth every 6 (six) hours as needed. For pain      . latanoprost (XALATAN) 0.005 % ophthalmic solution Place 1 drop into both eyes at bedtime.       . meclizine (ANTIVERT) 25 MG tablet Take 25 mg by mouth 3 (three) times daily as needed. For dizziness/vertigo      . Multiple Vitamin (MULITIVITAMIN WITH MINERALS) TABS Take 1 tablet by mouth daily.      Marland Kitchen neomycin-polymyxin-dexamethasone (MAXITROL) 0.1 % ophthalmic suspension 1 drop. Takes 4 gtts in EARS prn      . Propylene Glycol (SYSTANE BALANCE) 0.6 % SOLN Apply 1 drop to eye 4 (four) times daily as needed. For drop eyes      . timolol (BETIMOL) 0.5 % ophthalmic solution Place 1 drop into both eyes every morning.       Marland Kitchen losartan (COZAAR) 50 MG tablet Take 50 mg by mouth daily.        Allergies    Allergen Reactions  . Penicillins   . Sulfa Drugs Cross Reactors   . Chlorhexidine Rash    Review of Systems negative except from HPI and PMH  Physical Exam BP 136/71  Pulse 66  Ht 5\' 7"  (1.702 m)  Wt 142 lb 12.8 oz (64.774 kg)  BMI 22.37 kg/m2 Well developed and nourished in no acute distress HENT normal Neck supple with JVP-flat Clear Device pocket well healed; without hematoma or erythema  Regular rate and rhythm, no murmurs or gallops Abd-soft with active BS No Clubbing cyanosis edema Skin-warm and dry A & Oriented  Grossly normal sensory and motor function  Electrocardiogram demonstrates sinus rhythm with left axis and left bundle   Assessment and  Plan

## 2012-01-07 NOTE — Assessment & Plan Note (Signed)
As above.

## 2012-01-10 DIAGNOSIS — Z01419 Encounter for gynecological examination (general) (routine) without abnormal findings: Secondary | ICD-10-CM | POA: Diagnosis not present

## 2012-01-20 ENCOUNTER — Telehealth: Payer: Self-pay | Admitting: Internal Medicine

## 2012-01-20 NOTE — Telephone Encounter (Signed)
New problem:   Recently had mammogram done,  Patient did not let the Technician  know that she had a loop recorder implant. C/o  Discomfort in area.

## 2012-01-20 NOTE — Telephone Encounter (Signed)
I left a message for the patient to call. 

## 2012-01-21 NOTE — Telephone Encounter (Signed)
Pt rtn call to heather from yesterday , pls call [phone]

## 2012-01-21 NOTE — Telephone Encounter (Signed)
I spoke with the patient. She states she had a mammogram on 9/13. She is still having some soreness at the site where her ILR is. She states she just started driving again and she does notice some soreness to the left side when she turns the steering wheel. I reviewed with Gypsy Balsam, device RN. Per [facility], as long as no drainage, reddness, or swelling to the area, the device should be ok. I have reviewed with the patient and she denies any of these symptoms. I have explained to her that the pulling from the machine as well as her resuming driving may be aggravating her soreness, but this should resolve on its own. I have advised if she continues to have problems, she should call us back. She is agreeable.

## 2012-02-11 DIAGNOSIS — H4011X0 Primary open-angle glaucoma, stage unspecified: Secondary | ICD-10-CM | POA: Diagnosis not present

## 2012-02-11 DIAGNOSIS — H259 Unspecified age-related cataract: Secondary | ICD-10-CM | POA: Diagnosis not present

## 2012-02-11 DIAGNOSIS — H409 Unspecified glaucoma: Secondary | ICD-10-CM | POA: Diagnosis not present

## 2012-02-25 DIAGNOSIS — H269 Unspecified cataract: Secondary | ICD-10-CM | POA: Diagnosis not present

## 2012-02-25 DIAGNOSIS — Z01818 Encounter for other preprocedural examination: Secondary | ICD-10-CM | POA: Diagnosis not present

## 2012-03-11 DIAGNOSIS — H409 Unspecified glaucoma: Secondary | ICD-10-CM | POA: Diagnosis not present

## 2012-03-11 DIAGNOSIS — H25019 Cortical age-related cataract, unspecified eye: Secondary | ICD-10-CM | POA: Diagnosis not present

## 2012-03-11 DIAGNOSIS — H4011X0 Primary open-angle glaucoma, stage unspecified: Secondary | ICD-10-CM | POA: Diagnosis not present

## 2012-03-19 DIAGNOSIS — I447 Left bundle-branch block, unspecified: Secondary | ICD-10-CM | POA: Diagnosis not present

## 2012-03-19 DIAGNOSIS — H409 Unspecified glaucoma: Secondary | ICD-10-CM | POA: Diagnosis not present

## 2012-03-19 DIAGNOSIS — Z950 Presence of cardiac pacemaker: Secondary | ICD-10-CM | POA: Diagnosis not present

## 2012-03-19 DIAGNOSIS — H4011X0 Primary open-angle glaucoma, stage unspecified: Secondary | ICD-10-CM | POA: Diagnosis not present

## 2012-03-19 DIAGNOSIS — I495 Sick sinus syndrome: Secondary | ICD-10-CM | POA: Diagnosis not present

## 2012-04-07 DIAGNOSIS — H409 Unspecified glaucoma: Secondary | ICD-10-CM | POA: Diagnosis not present

## 2012-04-07 DIAGNOSIS — H4011X0 Primary open-angle glaucoma, stage unspecified: Secondary | ICD-10-CM | POA: Diagnosis not present

## 2012-04-14 ENCOUNTER — Encounter: Payer: Self-pay | Admitting: Internal Medicine

## 2012-04-14 ENCOUNTER — Inpatient Hospital Stay (HOSPITAL_COMMUNITY)
Admission: AD | Admit: 2012-04-14 | Discharge: 2012-04-16 | DRG: 244 | Disposition: A | Discharge status: Home / Self Care | Payer: Medicare Other | Source: Ambulatory Visit | Attending: Internal Medicine | Admitting: Internal Medicine

## 2012-04-14 ENCOUNTER — Encounter (INDEPENDENT_AMBULATORY_CARE_PROVIDER_SITE_OTHER): Payer: Medicare Other

## 2012-04-14 ENCOUNTER — Encounter (HOSPITAL_COMMUNITY): Payer: Self-pay

## 2012-04-14 ENCOUNTER — Ambulatory Visit (INDEPENDENT_AMBULATORY_CARE_PROVIDER_SITE_OTHER): Payer: Medicare Other | Admitting: Internal Medicine

## 2012-04-14 VITALS — BP 147/78 | HR 65 | Wt 140.0 lb

## 2012-04-14 DIAGNOSIS — Z959 Presence of cardiac and vascular implant and graft, unspecified: Secondary | ICD-10-CM

## 2012-04-14 DIAGNOSIS — Z79899 Other long term (current) drug therapy: Secondary | ICD-10-CM

## 2012-04-14 DIAGNOSIS — I447 Left bundle-branch block, unspecified: Secondary | ICD-10-CM | POA: Diagnosis not present

## 2012-04-14 DIAGNOSIS — R55 Syncope and collapse: Secondary | ICD-10-CM

## 2012-04-14 DIAGNOSIS — Z950 Presence of cardiac pacemaker: Secondary | ICD-10-CM | POA: Diagnosis not present

## 2012-04-14 DIAGNOSIS — R0989 Other specified symptoms and signs involving the circulatory and respiratory systems: Secondary | ICD-10-CM

## 2012-04-14 DIAGNOSIS — I442 Atrioventricular block, complete: Secondary | ICD-10-CM

## 2012-04-14 DIAGNOSIS — J9819 Other pulmonary collapse: Secondary | ICD-10-CM | POA: Diagnosis not present

## 2012-04-14 DIAGNOSIS — I10 Essential (primary) hypertension: Secondary | ICD-10-CM | POA: Diagnosis present

## 2012-04-14 HISTORY — DX: Atrioventricular block, complete: I44.2

## 2012-04-14 LAB — BASIC METABOLIC PANEL
BUN: 12 mg/dL (ref 6–23)
CO2: 26 mEq/L (ref 19–32)
Calcium: 10.3 mg/dL (ref 8.4–10.5)
Chloride: 102 mEq/L (ref 96–112)
Creatinine, Ser: 0.66 mg/dL (ref 0.50–1.10)
GFR calc Af Amer: >90 mL/min (ref >90)
GFR calc non Af Amer: 89 mL/min — ABNORMAL LOW (ref >90)
Glucose, Bld: 95 mg/dL (ref 70–99)
Potassium: 4.4 mEq/L (ref 3.5–5.1)
Sodium: 140 mEq/L (ref 135–145)

## 2012-04-14 LAB — PACEMAKER DEVICE OBSERVATION

## 2012-04-14 LAB — CBC WITH DIFFERENTIAL/PLATELET
Basophils Absolute: 0.1 10*3/uL (ref 0.0–0.1)
Basophils Relative: 1 % (ref 0–1)
Eosinophils Absolute: 0.4 10*3/uL (ref 0.0–0.7)
Eosinophils Relative: 4 % (ref 0–5)
HCT: 39.6 % (ref 36.0–46.0)
Hemoglobin: 13.7 g/dL (ref 12.0–15.0)
Lymphocytes Relative: 40 % (ref 12–46)
Lymphs Abs: 4.4 10*3/uL — ABNORMAL HIGH (ref 0.7–4.0)
MCH: 31.1 pg (ref 26.0–34.0)
MCHC: 34.6 g/dL (ref 30.0–36.0)
MCV: 89.8 fL (ref 78.0–100.0)
Monocytes Absolute: 0.8 10*3/uL (ref 0.1–1.0)
Monocytes Relative: 7 % (ref 3–12)
Neutro Abs: 5.4 10*3/uL (ref 1.7–7.7)
Neutrophils Relative %: 49 % (ref 43–77)
Platelets: 274 10*3/uL (ref 150–400)
RBC: 4.41 MIL/uL (ref 3.87–5.11)
RDW: 12.5 % (ref 11.5–15.5)
WBC: 11.1 10*3/uL — ABNORMAL HIGH (ref 4.0–10.5)

## 2012-04-14 LAB — MRSA PCR SCREENING: MRSA by PCR: NEGATIVE

## 2012-04-14 MED ORDER — ALPRAZOLAM 0.25 MG PO TABS
0.2500 mg | ORAL_TABLET | Freq: Two times a day (BID) | ORAL | Status: DC | PRN
  Administered 2012-04-15 – 2012-04-16 (×2): 0.25 mg via ORAL
  Filled 2012-04-14 (×2): qty 1

## 2012-04-14 MED ORDER — LATANOPROST 0.005 % OP SOLN
1.0000 [drp] | Freq: Every day | OPHTHALMIC | Status: DC
  Administered 2012-04-14 – 2012-04-15 (×2): 1 [drp] via OPHTHALMIC
  Filled 2012-04-14 (×2): qty 2.5

## 2012-04-14 MED ORDER — SODIUM CHLORIDE 0.9 % IV SOLN: 250.0000 mL | INTRAVENOUS | Status: DC | PRN

## 2012-04-14 MED ORDER — ONDANSETRON HCL 4 MG/2ML IJ SOLN: 4.0000 mg | Freq: Four times a day (QID) | INTRAMUSCULAR | Status: DC | PRN

## 2012-04-14 MED ORDER — SODIUM CHLORIDE 0.9 % IR SOLN
80.0000 mg | Status: DC
  Filled 2012-04-14: qty 2

## 2012-04-14 MED ORDER — SODIUM CHLORIDE 0.9 % IJ SOLN: 3.0000 mL | INTRAMUSCULAR | Status: DC | PRN

## 2012-04-14 MED ORDER — PREDNISOLONE ACETATE 1 % OP SUSP
1.0000 [drp] | Freq: Two times a day (BID) | OPHTHALMIC | Status: DC
  Administered 2012-04-15 – 2012-04-16 (×2): 1 [drp] via OPHTHALMIC
  Filled 2012-04-14 (×2): qty 1

## 2012-04-14 MED ORDER — ZOLPIDEM TARTRATE 5 MG PO TABS: 5.0000 mg | ORAL_TABLET | Freq: Every evening | ORAL | Status: DC | PRN

## 2012-04-14 MED ORDER — SODIUM CHLORIDE 0.9 % IV SOLN: 250.0000 mL | INTRAVENOUS | Status: DC

## 2012-04-14 MED ORDER — SODIUM CHLORIDE 0.45 % IV SOLN
INTRAVENOUS | Status: DC
  Administered 2012-04-15: 06:00:00 via INTRAVENOUS

## 2012-04-14 MED ORDER — PREDNISOLONE SODIUM PHOSPHATE 1 % OP SOLN
1.0000 [drp] | Freq: Two times a day (BID) | OPHTHALMIC | Status: DC
  Filled 2012-04-14: qty 10

## 2012-04-14 MED ORDER — SODIUM CHLORIDE 0.9 % IJ SOLN
3.0000 mL | Freq: Two times a day (BID) | INTRAMUSCULAR | Status: DC
  Administered 2012-04-14 – 2012-04-15 (×3): 3 mL via INTRAVENOUS

## 2012-04-14 MED ORDER — SODIUM CHLORIDE 0.9 % IJ SOLN
3.0000 mL | Freq: Two times a day (BID) | INTRAMUSCULAR | Status: DC
  Administered 2012-04-14: 3 mL via INTRAVENOUS

## 2012-04-14 MED ORDER — VANCOMYCIN HCL IN DEXTROSE 1-5 GM/200ML-% IV SOLN
1000.0000 mg | INTRAVENOUS | Status: DC
  Filled 2012-04-14: qty 200

## 2012-04-14 MED ORDER — ACETAMINOPHEN 325 MG PO TABS
650.0000 mg | ORAL_TABLET | ORAL | Status: DC | PRN
  Administered 2012-04-16 (×2): 650 mg via ORAL
  Filled 2012-04-14 (×2): qty 2

## 2012-04-14 MED ORDER — NEPAFENAC 0.3 % OP SUSP: 0.8000 mL | OPHTHALMIC | Status: DC

## 2012-04-14 MED ORDER — KETOROLAC TROMETHAMINE 0.5 % OP SOLN
1.0000 [drp] | Freq: Four times a day (QID) | OPHTHALMIC | Status: DC
  Administered 2012-04-15 – 2012-04-16 (×3): 1 [drp] via OPHTHALMIC
  Filled 2012-04-14 (×2): qty 3

## 2012-04-14 NOTE — Assessment & Plan Note (Signed)
As above.

## 2012-04-14 NOTE — H&P (Signed)
Charlene Barker   04/14/2012 4:00 PM Office Visit  MRN: [id number]   Description: 66 year old female  Provider: Duke Salvia, MD  Department: [facility]   Referring Provider     Merlene Laughter. Renae Gloss, MD   Diagnoses     Syncope   - Primary    780.2    Left bundle branch block     426.3    Unspecified cardiac device in situ     V45.00    Progress Notes     [name], MD  04/14/2012  6:05 PM  Signed Patient Care Team: Merlene Laughter. Renae Gloss, MD as PCP - General (Internal Medicine)  HPI  Charlene Barker is a 66 y.o. female Seen in followup for syncope foe which she is s/p loop recorder implant.  She has LBBB  Followed by Ottis Stain   She has had  intercurrent syncope yesterday while visiting at work. She awakened with some confusion she comes in today in anticipation of her loop recorder demonstrates about 15 seconds of complete heart block    Past Medical History   Diagnosis  Date   .  HTN (hypertension)     .  Syncope     .  Left bundle branch block     .  Lightheadedness         Associated with exercise       Past Surgical History   Procedure  Date   .  Bladder surgery         Current Outpatient Prescriptions   Medication  Sig  Dispense  Refill   .  acebutolol (SECTRAL) 200 MG capsule  Take 200 mg by mouth daily.         .  Ascorbic Acid (VITAMIN C) 1000 MG tablet  Take 1,000 mg by mouth daily.         .  Biotin 1000 MCG tablet  Take 1,000 mcg by mouth daily.         Marland Kitchen  CALCIUM-MAGNESIUM-ZINC PO  Take 1 capsule by mouth daily.         .  Cholecalciferol (VITAMIN D3) 2000 UNITS capsule  Take 2,000 Units by mouth daily.         Marland Kitchen  ibuprofen (ADVIL,MOTRIN) 200 MG tablet  Take 200 mg by mouth every 6 (six) hours as needed. For pain         .  latanoprost (XALATAN) 0.005 % ophthalmic solution  Place 1 drop into both eyes at bedtime.          .  meclizine (ANTIVERT) 25 MG tablet  Take 25 mg by mouth 3 (three) times daily as needed. For dizziness/vertigo         .   Multiple Vitamin (MULITIVITAMIN WITH MINERALS) TABS  Take 1 tablet by mouth daily.         Marland Kitchen  neomycin-polymyxin-dexamethasone (MAXITROL) 0.1 % ophthalmic suspension  1 drop. Takes 4 gtts in EARS prn         .  Nepafenac (ILEVRO) 0.3 % SUSP  Apply to eye as directed.         .  prednisoLONE sodium phosphate (INFLAMASE FORTE) 1 % ophthalmic solution  1 drop 2 (two) times daily.         .  timolol (BETIMOL) 0.5 % ophthalmic solution  Place 1 drop into both eyes every morning.              Allergies  Allergen  Reactions   .  Penicillins     .  Sulfa Drugs Cross Reactors     .  Chlorhexidine  Rash    Review of Systems negative except from HPI and PMH  Physical Exam BP 147/78  Pulse 65  Wt 140 lb (63.504 kg) Well developed and nourished in no acute distress HENT normal Neck supple with JVP-flat Clear Device pocket well healed; without hematoma or erythema   Regular rate and rhythm, no murmurs or gallops Abd-soft with active BS No Clubbing cyanosis edema Skin-warm and dry A & Oriented  Grossly normal sensory and motor function  Electrocardiogram demonstrates sinus rhythm with left axis and left bundle  Assessment and  Plan       Syncope - [name], MD  04/14/2012  6:02 PM  Addendum The patient's syncope is associated with complete heart block as is suggested by the presence of left bundle branch block. We'll admit her to hospital and implanted pacemaker tomorrow. I have reviewed with her risks and benefits of the procedure. Loop recorder be explanted the same time.  We will continue her meds except sectral      Left bundle branch block - [name], MD  04/14/2012  5:12 PM  Signed As above    Implantable loop recorder-Medtronic - [name], MD  04/14/2012  5:13 PM  Signed As above       Vitals - Last Recorded       BP Pulse Wt        147/78 65 63.504 kg (140 lb)            Orders Placed This Encounter     Pacemaker Device Observation [VAS2001 Custom]        Results are available for this encounter      ADDENDUM: Please see above office note by Dr. Graciela Husbands for H&P. Admission orders placed. Pre-PPM orders have been signed and held per Dr. Graciela Husbands. Plan is for PPM tomorrow AM. Will continue to monitor overnight in CCU.   Jacqulyn Bath, PA-C 04/14/2012 6:48 PM

## 2012-04-14 NOTE — Assessment & Plan Note (Addendum)
The patient's syncope is associated with complete heart block as is suggested by the presence of left bundle branch block. We'll admit her to hospital and implanted pacemaker tomorrow. I have reviewed with her risks and benefits of the procedure. Loop recorder be explanted the same time.  We will continue her meds except sectral

## 2012-04-14 NOTE — Progress Notes (Signed)
Patient Care Team: Merlene Laughter. Renae Gloss, MD as PCP - General (Internal Medicine)   HPI  Charlene Barker is a 66 y.o. female Seen in followup for syncope foe which she is s/p loop recorder implant.  She has LBBB  Followed by Ottis Stain  She has had  intercurrent syncope yesterday while visiting at work. She awakened with some confusion she comes in today in anticipation of her loop recorder demonstrates about 15 seconds of complete heart block  Past Medical History  Diagnosis Date  . HTN (hypertension)   . Syncope   . Left bundle branch block   . Lightheadedness     Associated with exercise    Past Surgical History  Procedure Date  . Bladder surgery     Current Outpatient Prescriptions  Medication Sig Dispense Refill  . acebutolol (SECTRAL) 200 MG capsule Take 200 mg by mouth daily.      . Ascorbic Acid (VITAMIN C) 1000 MG tablet Take 1,000 mg by mouth daily.      . Biotin 1000 MCG tablet Take 1,000 mcg by mouth daily.      Marland Kitchen CALCIUM-MAGNESIUM-ZINC PO Take 1 capsule by mouth daily.      . Cholecalciferol (VITAMIN D3) 2000 UNITS capsule Take 2,000 Units by mouth daily.      Marland Kitchen ibuprofen (ADVIL,MOTRIN) 200 MG tablet Take 200 mg by mouth every 6 (six) hours as needed. For pain      . latanoprost (XALATAN) 0.005 % ophthalmic solution Place 1 drop into both eyes at bedtime.       . meclizine (ANTIVERT) 25 MG tablet Take 25 mg by mouth 3 (three) times daily as needed. For dizziness/vertigo      . Multiple Vitamin (MULITIVITAMIN WITH MINERALS) TABS Take 1 tablet by mouth daily.      Marland Kitchen neomycin-polymyxin-dexamethasone (MAXITROL) 0.1 % ophthalmic suspension 1 drop. Takes 4 gtts in EARS prn      . Nepafenac (ILEVRO) 0.3 % SUSP Apply to eye as directed.      . prednisoLONE sodium phosphate (INFLAMASE FORTE) 1 % ophthalmic solution 1 drop 2 (two) times daily.      . timolol (BETIMOL) 0.5 % ophthalmic solution Place 1 drop into both eyes every morning.         Allergies  Allergen Reactions  .  Penicillins   . Sulfa Drugs Cross Reactors   . Chlorhexidine Rash    Review of Systems negative except from HPI and PMH  Physical Exam BP 147/78  Pulse 65  Wt 140 lb (63.504 kg) Well developed and nourished in no acute distress HENT normal Neck supple with JVP-flat Clear Device pocket well healed; without hematoma or erythema  Regular rate and rhythm, no murmurs or gallops Abd-soft with active BS No Clubbing cyanosis edema Skin-warm and dry A & Oriented  Grossly normal sensory and motor function  Electrocardiogram demonstrates sinus rhythm with left axis and left bundle   Assessment and  Plan

## 2012-04-15 ENCOUNTER — Encounter (HOSPITAL_COMMUNITY): Payer: Self-pay | Admitting: *Deleted

## 2012-04-15 ENCOUNTER — Encounter (HOSPITAL_COMMUNITY)
Admission: AD | Disposition: A | Discharge status: Home / Self Care | Payer: Self-pay | Source: Ambulatory Visit | Attending: Internal Medicine

## 2012-04-15 DIAGNOSIS — Z79899 Other long term (current) drug therapy: Secondary | ICD-10-CM | POA: Diagnosis not present

## 2012-04-15 DIAGNOSIS — I447 Left bundle-branch block, unspecified: Secondary | ICD-10-CM | POA: Diagnosis not present

## 2012-04-15 DIAGNOSIS — I442 Atrioventricular block, complete: Secondary | ICD-10-CM

## 2012-04-15 DIAGNOSIS — I10 Essential (primary) hypertension: Secondary | ICD-10-CM | POA: Diagnosis not present

## 2012-04-15 DIAGNOSIS — Z950 Presence of cardiac pacemaker: Secondary | ICD-10-CM | POA: Insufficient documentation

## 2012-04-15 HISTORY — PX: PERMANENT PACEMAKER INSERTION: SHX5480

## 2012-04-15 LAB — CBC
HCT: 37 % (ref 36.0–46.0)
Hemoglobin: 12.8 g/dL (ref 12.0–15.0)
MCH: 30.8 pg (ref 26.0–34.0)
MCHC: 34.6 g/dL (ref 30.0–36.0)
MCV: 88.9 fL (ref 78.0–100.0)
Platelets: 246 10*3/uL (ref 150–400)
RBC: 4.16 MIL/uL (ref 3.87–5.11)
RDW: 12.5 % (ref 11.5–15.5)
WBC: 9.4 10*3/uL (ref 4.0–10.5)

## 2012-04-15 LAB — BASIC METABOLIC PANEL
BUN: 15 mg/dL (ref 6–23)
CO2: 24 mEq/L (ref 19–32)
Calcium: 9.4 mg/dL (ref 8.4–10.5)
Chloride: 103 mEq/L (ref 96–112)
Creatinine, Ser: 0.61 mg/dL (ref 0.50–1.10)
GFR calc Af Amer: >90 mL/min (ref >90)
GFR calc non Af Amer: >90 mL/min (ref >90)
Glucose, Bld: 99 mg/dL (ref 70–99)
Potassium: 3.6 mEq/L (ref 3.5–5.1)
Sodium: 139 mEq/L (ref 135–145)

## 2012-04-15 LAB — PROTIME-INR
INR: 1.02 (ref 0.00–1.49)
Prothrombin Time: 13.3 seconds (ref 11.6–15.2)

## 2012-04-15 LAB — TSH: TSH: 1.889 u[IU]/mL (ref 0.350–4.500)

## 2012-04-15 SURGERY — PERMANENT PACEMAKER INSERTION
Anesthesia: LOCAL

## 2012-04-15 MED ORDER — SODIUM CHLORIDE 0.9 % IV SOLN
INTRAVENOUS | Status: AC
  Administered 2012-04-15: 16:00:00 via INTRAVENOUS

## 2012-04-15 MED ORDER — ACEBUTOLOL HCL 200 MG PO CAPS
200.0000 mg | ORAL_CAPSULE | Freq: Two times a day (BID) | ORAL | Status: DC
  Administered 2012-04-16: 200 mg via ORAL
  Filled 2012-04-15 (×4): qty 1

## 2012-04-15 MED ORDER — ACETAMINOPHEN 325 MG PO TABS: 325.0000 mg | ORAL_TABLET | ORAL | Status: DC | PRN

## 2012-04-15 MED ORDER — OXYCODONE-ACETAMINOPHEN 5-325 MG PO TABS
1.0000 | ORAL_TABLET | Freq: Four times a day (QID) | ORAL | Status: DC | PRN
  Administered 2012-04-16: 1 via ORAL
  Filled 2012-04-15: qty 1
  Filled 2012-04-15 (×2): qty 2

## 2012-04-15 MED ORDER — LIDOCAINE HCL (PF) 1 % IJ SOLN
INTRAMUSCULAR | Status: AC
  Filled 2012-04-15: qty 30

## 2012-04-15 MED ORDER — ONDANSETRON HCL 4 MG/2ML IJ SOLN: 4.0000 mg | Freq: Four times a day (QID) | INTRAMUSCULAR | Status: DC | PRN

## 2012-04-15 MED ORDER — VANCOMYCIN HCL IN DEXTROSE 1-5 GM/200ML-% IV SOLN
1000.0000 mg | Freq: Two times a day (BID) | INTRAVENOUS | Status: AC
  Filled 2012-04-15: qty 200

## 2012-04-15 MED ORDER — LIDOCAINE HCL (PF) 1 % IJ SOLN
INTRAMUSCULAR | Status: AC
  Filled 2012-04-15: qty 60

## 2012-04-15 MED ORDER — OXYCODONE-ACETAMINOPHEN 5-325 MG PO TABS
2.0000 | ORAL_TABLET | Freq: Once | ORAL | Status: AC
  Administered 2012-04-15: 2 via ORAL
  Filled 2012-04-15: qty 2

## 2012-04-15 MED ORDER — LABETALOL HCL 5 MG/ML IV SOLN
INTRAVENOUS | Status: AC
  Filled 2012-04-15: qty 4

## 2012-04-15 MED ORDER — MIDAZOLAM HCL 5 MG/5ML IJ SOLN
INTRAMUSCULAR | Status: AC
  Filled 2012-04-15: qty 5

## 2012-04-15 MED ORDER — VANCOMYCIN HCL IN DEXTROSE 1-5 GM/200ML-% IV SOLN
1000.0000 mg | Freq: Once | INTRAVENOUS | Status: AC
  Administered 2012-04-15: 1000 mg via INTRAVENOUS
  Filled 2012-04-15: qty 200

## 2012-04-15 MED ORDER — FENTANYL CITRATE 0.05 MG/ML IJ SOLN
INTRAMUSCULAR | Status: AC
  Filled 2012-04-15: qty 2

## 2012-04-15 NOTE — Progress Notes (Signed)
VSS throughout monitoring post pacemaker. Patient resting comfortably.

## 2012-04-15 NOTE — Progress Notes (Signed)
feeliong better after IV failed and versed/fentanyl didn't go in  Will need pain meds at discharge

## 2012-04-15 NOTE — Progress Notes (Signed)
To the EP lab by bed, stable.

## 2012-04-15 NOTE — H&P (View-Only) (Signed)
  Patient Name: Charlene Barker      SUBJECTIVE: admitted yesterday with syncope and doc >15 sec CHB ins etting of LBBB  For pacer today No problems overnight   Past Medical History  Diagnosis Date  . HTN (hypertension)   . Syncope   . Left bundle branch block   . Lightheadedness     Associated with exercise    PHYSICAL EXAM Filed Vitals:   04/14/12 2334 04/14/12 2355 04/15/12 0345 04/15/12 0409  BP: 139/58  139/62   Pulse: 74  60   Temp:  97.3 F (36.3 C)  97.6 F (36.4 C)  TempSrc:  Oral  Oral  Resp: 17  12   Height:      Weight:      SpO2: 99%  97%    Well developed and nourished in no acute distress HENT normal Neck supple with JVP-flat Clear Regular rate and rhythm, no murmurs or gallops Abd-soft with active BS No Clubbing cyanosis edema Skin-warm and dry A & Oriented  Grossly normal sensory and motor function  TELEMETRY: Reviewed telemetry pt in *NSR   No intake or output data in the 24 hours ending 04/15/12 0728  LABS: Basic Metabolic Panel:  Lab 04/15/12 0440 04/14/12 2000  NA 139 140  K 3.6 4.4  CL 103 102  CO2 24 26  GLUCOSE 99 95  BUN 15 12  CREATININE 0.61 0.66  CALCIUM 9.4 10.3  MG -- --  PHOS -- --   Cardiac Enzymes: No results found for this basename: CKTOTAL:3,CKMB:3,CKMBINDEX:3,TROPONINI:3 in the last 72 hours CBC:  Lab 04/15/12 0440 04/14/12 2000  WBC 9.4 11.1*  NEUTROABS -- 5.4  HGB 12.8 13.7  HCT 37.0 39.6  MCV 88.9 89.8  PLT 246 274   PROTIME:  Basename 04/15/12 0440  LABPROT 13.3  INR 1.02     Basename 04/14/12 2000  TSH 1.889  T4TOTAL --  T3FREE --  THYROIDAB --    ASSESSMENT AND PLAN:  Patient Active Hospital Problem List: Complete heart block (04/14/2012)   Syncope ()   Left bundle branch block ()   Implantable loop recorder-Medtronic (01/07/2012)   For pacer implant today and loop explant  Will plan L sided implant     Signed, Sebastian Dzik MD  04/15/2012   

## 2012-04-15 NOTE — Interval H&P Note (Signed)
History and Physical Interval Note:  04/15/2012 7:39 AM  Charlene Barker  has presented today for surgery, with the diagnosis of bradycardia  The various methods of treatment have been discussed with the patient and family. After consideration of risks, benefits and other options for treatment, the patient has consented to  Procedure(s) (LRB) with comments: PERMANENT PACEMAKER INSERTION (N/A) as a surgical intervention .  The patient's history has been reviewed, patient examined, no change in status, stable for surgery.  I have reviewed the patient's chart and labs.  Questions were answered to the patient's satisfaction.     [name]

## 2012-04-15 NOTE — Progress Notes (Signed)
Patient very irritable, throwing items around room. Calmed patient down and will call MD for pain med other than tylenol as patient is refusing tylenol. 2 percocet given at 1515 as well as 0.25 of xanax. Will cont to monitor patient.

## 2012-04-15 NOTE — Progress Notes (Signed)
Pt with allergy to chlorhexidine thus unable to perform Hibiclens scrub tonight or in the am. Dr. Armanda Magic notified. Chloroxylenol used in place of chlorhexidine tonight, Chloroxylenol recommended by operating room as a substitute for Chlorhexidine for patients with allergies.

## 2012-04-15 NOTE — Progress Notes (Signed)
  Patient Name: Charlene Barker      SUBJECTIVE: admitted yesterday with syncope and doc >15 sec CHB ins etting of LBBB  For pacer today No problems overnight   Past Medical History  Diagnosis Date  . HTN (hypertension)   . Syncope   . Left bundle branch block   . Lightheadedness     Associated with exercise    PHYSICAL EXAM Filed Vitals:   04/14/12 2334 04/14/12 2355 04/15/12 0345 04/15/12 0409  BP: 139/58  139/62   Pulse: 74  60   Temp:  97.3 F (36.3 C)  97.6 F (36.4 C)  TempSrc:  Oral  Oral  Resp: 17  12   Height:      Weight:      SpO2: 99%  97%    Well developed and nourished in no acute distress HENT normal Neck supple with JVP-flat Clear Regular rate and rhythm, no murmurs or gallops Abd-soft with active BS No Clubbing cyanosis edema Skin-warm and dry A & Oriented  Grossly normal sensory and motor function  TELEMETRY: Reviewed telemetry pt in *NSR   No intake or output data in the 24 hours ending 04/15/12 0728  LABS: Basic Metabolic Panel:  Lab 04/15/12 9604 04/14/12 2000  NA 139 140  K 3.6 4.4  CL 103 102  CO2 24 26  GLUCOSE 99 95  BUN 15 12  CREATININE 0.61 0.66  CALCIUM 9.4 10.3  MG -- --  PHOS -- --   Cardiac Enzymes: No results found for this basename: CKTOTAL:3,CKMB:3,CKMBINDEX:3,TROPONINI:3 in the last 72 hours CBC:  Lab 04/15/12 0440 04/14/12 2000  WBC 9.4 11.1*  NEUTROABS -- 5.4  HGB 12.8 13.7  HCT 37.0 39.6  MCV 88.9 89.8  PLT 246 274   PROTIME:  Basename 04/15/12 0440  LABPROT 13.3  INR 1.02     Basename 04/14/12 2000  TSH 1.889  T4TOTAL --  T3FREE --  THYROIDAB --    ASSESSMENT AND PLAN:  Patient Active Hospital Problem List: Complete heart block (04/14/2012)   Syncope ()   Left bundle branch block ()   Implantable loop recorder-Medtronic (01/07/2012)   For pacer implant today and loop explant  Will plan L sided implant     Signed, [name] MD  04/15/2012

## 2012-04-15 NOTE — CV Procedure (Signed)
Preop DX::intermittent complete heart block; previous loop recorder insertion Post op DX:: same  Procedure  dual pacemaker implantation; loop recorder implant  After routine prep and drape, lidocaine was infiltrated in the prepectoral subclavicular region on the left side an incision was made and carried down to later the prepectoral fascia using electrocautery and sharp dissection a pocket was formed similarly. Hemostasis was obtained.  After this, we turned our attention to gaining accessm to the extrathoracic,left subclavian vein. This was accomplished without difficulty and without the aspiration of air or puncture of the artery. 2 separate venipunctures were accomplished; guidewires were placed and retained and sequentially 7 French sheath through which were  passed a [employer] 2088 ventricular lead serial numberCAU [id number] and an [employer] Jude passive atrial lead serial number [id number] .  The ventricular lead was manipulated to the right ventricular apex with a bipolar R wave was 17.2, the pacing impedance was 1004, the threshold was 1.0 @ 0.5 msec  Current at threshold was   1  Ma and the current of injury was  brisk.  The right atrial lead was manipulated to the right atrial appendage with a bipolar P-wave  2.4, the pacing impedance was 494, the threshold 1.0@ 0.5 msec   Current at threshold was 2  Ma    The leads were attached to    [facility]  pulse generator serial number [id number].  Hemostasis was obtained. The pocket was copiously irrigated with antibiotic containing saline solution. The leads and the pulse generator were placed in the pocket and affixed to the prepectoral fascia. The wound was then closed in 3 layers in the normal fashion. The wound was washed dried and a benzoin Steri-Strip dressing was applied.  Needle  Count, sponge counts and instrument counts were correct at the end of the procedure .     After the pacemaker was implanted, the loop incision was anesthetized an incision was  made carried down to later the device pocket. The loop recorder was explanted the retaining sutures removed the pocket irrigated and the wound closed in 2 layers in normal fashion.  During the procedure the patient had significant discomfort with complaints of "being unable to breathe". She also had left-sided chest discomfort that worsened with exhalation. She then would be become calm and rather sedated and then begin crying again  Repeated BP assessments>> normal-high and flourscopy demonstrated normal heart border motion  [name].D.

## 2012-04-16 ENCOUNTER — Inpatient Hospital Stay (HOSPITAL_COMMUNITY): Payer: Medicare Other

## 2012-04-16 DIAGNOSIS — R55 Syncope and collapse: Secondary | ICD-10-CM | POA: Diagnosis not present

## 2012-04-16 DIAGNOSIS — I10 Essential (primary) hypertension: Secondary | ICD-10-CM | POA: Diagnosis not present

## 2012-04-16 DIAGNOSIS — J9819 Other pulmonary collapse: Secondary | ICD-10-CM | POA: Diagnosis not present

## 2012-04-16 DIAGNOSIS — I442 Atrioventricular block, complete: Secondary | ICD-10-CM | POA: Diagnosis not present

## 2012-04-16 DIAGNOSIS — Z950 Presence of cardiac pacemaker: Secondary | ICD-10-CM | POA: Diagnosis not present

## 2012-04-16 DIAGNOSIS — I447 Left bundle-branch block, unspecified: Secondary | ICD-10-CM | POA: Diagnosis not present

## 2012-04-16 DIAGNOSIS — Z79899 Other long term (current) drug therapy: Secondary | ICD-10-CM | POA: Diagnosis not present

## 2012-04-16 LAB — CBC
HCT: 39.8 % (ref 36.0–46.0)
Hemoglobin: 13.8 g/dL (ref 12.0–15.0)
MCH: 30.9 pg (ref 26.0–34.0)
MCHC: 34.7 g/dL (ref 30.0–36.0)
MCV: 89.2 fL (ref 78.0–100.0)
Platelets: 213 10*3/uL (ref 150–400)
RBC: 4.46 MIL/uL (ref 3.87–5.11)
RDW: 12.5 % (ref 11.5–15.5)
WBC: 10.2 10*3/uL (ref 4.0–10.5)

## 2012-04-16 LAB — BASIC METABOLIC PANEL
BUN: 15 mg/dL (ref 6–23)
CO2: 25 mEq/L (ref 19–32)
Calcium: 9.1 mg/dL (ref 8.4–10.5)
Chloride: 102 mEq/L (ref 96–112)
Creatinine, Ser: 0.61 mg/dL (ref 0.50–1.10)
GFR calc Af Amer: >90 mL/min (ref >90)
GFR calc non Af Amer: >90 mL/min (ref >90)
Glucose, Bld: 127 mg/dL — ABNORMAL HIGH (ref 70–99)
Potassium: 3.8 mEq/L (ref 3.5–5.1)
Sodium: 137 mEq/L (ref 135–145)

## 2012-04-16 NOTE — Progress Notes (Signed)
Pt discharge instructions and patient education complete. IV site d/c. Site WNL. Pacemaker instructions explained to patient and husband. No further questions. D/C home with husband. [name]

## 2012-04-16 NOTE — Progress Notes (Signed)
CARDIAC REHAB PHASE I   PRE:  Rate/Rhythm: 82SR  BP:  Supine:   Sitting: 136/64  Standing:    SaO2: 97%RA  MODE:  Ambulation: 500 ft   POST:  Rate/Rhythem: 76  BP:  Supine:   Sitting: 152/68  Standing:    SaO2: 100%RA 1050-1132 Pt looks sleepy but she states her eyes are heavy from eye surgery in NOV. Walked 500 ft on RA with ast x 1. C/o lightheadedness during walk and weak legs. Generalized weakness. To chair after walk. Pt states she thinks some of the lightheadedness if from left eye surgery. States vision foggy in that eye.  Charlene Barker

## 2012-04-16 NOTE — Progress Notes (Signed)
ELECTROPHYSIOLOGY ROUNDING NOTE    Patient Name: Charlene Barker Date of Encounter: 04-16-2012    SUBJECTIVE:Patient with significant anxiety this morning.  No chest pain or shortness of breath. S/p PPM implant and ILR explant 04-15-2012 for documented CHB during syncopal spell  TELEMETRY: Reviewed telemetry pt in sinus rhythm Filed Vitals:   04/15/12 1130 04/15/12 1500 04/15/12 2038 04/16/12 0343  BP: 145/65 153/62 115/55 154/68  Pulse: 75 61 72 72  Temp: 97.8 F (36.6 C) 98.1 F (36.7 C) 97.5 F (36.4 C) 97.1 F (36.2 C)  TempSrc: Oral Oral Oral Oral  Resp: 17 17 18 18   Height:      Weight:      SpO2: 95% 100% 96% 98%    Intake/Output Summary (Last 24 hours) at 04/16/12 0814 Last data filed at 04/15/12 1800  Gross per 24 hour  Intake    680 ml  Output      0 ml  Net    680 ml    LABS: Basic Metabolic Panel:  Basename 04/16/12 0555 04/15/12 0440  NA 137 139  K 3.8 3.6  CL 102 103  CO2 25 24  GLUCOSE 127* 99  BUN 15 15  CREATININE 0.61 0.61  CALCIUM 9.1 9.4  MG -- --  PHOS -- --   CBC:  Basename 04/16/12 0555 04/15/12 0440 04/14/12 2000  WBC 10.2 9.4 --  NEUTROABS -- -- 5.4  HGB 13.8 12.8 --  HCT 39.8 37.0 --  MCV 89.2 88.9 --  PLT 213 246 --   Thyroid Function Tests:  Basename 04/14/12 2000  TSH 1.889  T4TOTAL --  T3FREE --  THYROIDAB --   Physical Exam: Filed Vitals:   04/15/12 1130 04/15/12 1500 04/15/12 2038 04/16/12 0343  BP: 145/65 153/62 115/55 154/68  Pulse: 75 61 72 72  Temp: 97.8 F (36.6 C) 98.1 F (36.7 C) 97.5 F (36.4 C) 97.1 F (36.2 C)  TempSrc: Oral Oral Oral Oral  Resp: 17 17 18 18   Height:      Weight:      SpO2: 95% 100% 96% 98%    GEN- The patient is very lethargic appearing,  oriented x 3 today.   Head- normocephalic, atraumatic Eyes-  Sclera clear, conjunctiva pink Ears- hearing intact Oropharynx- clear Neck- supple,   Lungs- Clear to ausculation bilaterally, normal work of breathing Heart- Regular  rate and rhythm  GI- soft, NT, ND, + BS Extremities- no clubbing, cyanosis, or edema Chest- pacemaker site is without hematoma  Assessment and Plan:  Radiology/Studies:  Dg Chest 2 View 04/16/2012  *RADIOLOGY REPORT*  Clinical Data: Left bundle  branch block.  Status post implantation of pacemaker.  CHEST - 2 VIEW  Comparison: 09/20/2011  Findings: Dual lead pacemaker has been inserted.  The heart size and vascularity are normal and the lungs are clear except for minimal linear atelectasis at the bases.  No pneumothorax.  IMPRESSION: Satisfactory appearance of the chest after pacemaker insertion.   Original Report Authenticated By: Francene Boyers, M.D.    DEVICE INTERROGATION: Device interrogated reviewed.  Lead values including impedence, sensing, threshold within normal values.     A/P1. Syncope with transient AV block Doing well s/p PPM CXR reveals stable leads Interrogation is normal She has received too much pain medicine and is very sleepy Will allow to wash out this am. Once ambulatory, will discharge Tylenol for pain  No driving until cleared by Dr Graciela Husbands  Wound care, arm mobility, restrictions reviewed with patient.  Routine follow up scheduled.

## 2012-04-16 NOTE — Discharge Summary (Signed)
ELECTROPHYSIOLOGY PROCEDURE DISCHARGE SUMMARY    Patient ID: Charlene Barker,  MRN: [id number], DOB/AGE: Jun 12, 1945 66 y.o.  Admit date: 04/14/2012 Discharge date: 04/16/2012  Primary Care Physician: Andi Devon, MD Primary Cardiologist: Yates Decamp, MD Electrophysiologist: [name], MD  Primary Discharge Diagnosis:  Syncope with complete heart block status post pacemaker implantation this admission  Secondary Discharge Diagnosis:  1.  Hypertension 2.  LBBB  Procedures This Admission:  1.  Implantation of a dual chamber pacemaker and explantation of an implantable loop recorder on 04-15-2012.  The patient received a STJ model number 2210 pacemaker with model number 2088 right atrial and right ventricular leads.  The previously implanted loop recorder was then explanted.  There were no early apparent complications. 2.  Chest x-ray on 04-16-2012 demonstrated no pneumothorax status post device implant.   Brief HPI: EVANN KOELZER is a 66 y.o. female who was seen in the office for followup for syncope.  She had previously undergone loop recorder implantation.  The day before admission, she had a syncopal spell.  This correlated with 15 seconds of complete heart block documented on loop recorder.  Admission and pacemaker implantation was recommended.    Hospital Course:  The patient was admitted and underwent implantation of a dual chamber pacemaker with details as outlined above.   She was monitored on telemetry overnight which demonstrated sinus rhythm.  Left chest was without hematoma or ecchymosis.  The device was interrogated and found to be functioning normally.  CXR was obtained and demonstrated no pneumothorax status post device implantation.  Wound care, arm mobility, and restrictions were reviewed with the patient.  Dr Johney Frame examined the patient and considered them stable for discharge to home.    Discharge Vitals: Blood pressure 114/72, pulse 73, temperature 98.2 F  (36.8 C), temperature source Oral, resp. rate 18, height 5\' 4"  (1.626 m), weight 138 lb 10.7 oz (62.9 kg), SpO2 100.00%.    Labs:   Lab Results  Component Value Date   WBC 10.2 04/16/2012   HGB 13.8 04/16/2012   HCT 39.8 04/16/2012   MCV 89.2 04/16/2012   PLT 213 04/16/2012     Lab 04/16/12 0555  NA 137  K 3.8  CL 102  CO2 25  BUN 15  CREATININE 0.61  CALCIUM 9.1  PROT --  BILITOT --  ALKPHOS --  ALT --  AST --  GLUCOSE 127*    Discharge Medications:    Medication List     As of 04/16/2012  2:58 PM    TAKE these medications         acebutolol 200 MG capsule   Commonly known as: SECTRAL   Take 200 mg by mouth daily.      Biotin 1000 MCG tablet   Take 1,000 mcg by mouth daily.      CALCIUM-MAGNESIUM-ZINC PO   Take 1 capsule by mouth daily.      ibuprofen 200 MG tablet   Commonly known as: ADVIL,MOTRIN   Take 200 mg by mouth every 6 (six) hours as needed. For pain      ILEVRO 0.3 % Susp   Generic drug: Nepafenac   Apply to eye as directed.      latanoprost 0.005 % ophthalmic solution   Commonly known as: XALATAN   Place 1 drop into both eyes at bedtime.      meclizine 25 MG tablet   Commonly known as: ANTIVERT   Take 25 mg by mouth 3 (three) times  daily as needed. For dizziness/vertigo      multivitamin with minerals Tabs   Take 1 tablet by mouth daily.      neomycin-polymyxin-dexamethasone 0.1 % ophthalmic suspension   Commonly known as: MAXITROL   1 drop. Takes 4 gtts in EARS prn      prednisoLONE sodium phosphate 1 % ophthalmic solution   Commonly known as: INFLAMASE FORTE   1 drop 2 (two) times daily.      timolol 0.5 % ophthalmic solution   Commonly known as: BETIMOL   Place 1 drop into both eyes every morning.      vitamin C 1000 MG tablet   Take 1,000 mg by mouth daily.      Vitamin D3 2000 UNITS capsule   Take 2,000 Units by mouth daily.         Disposition:  Discharge Orders    Future Appointments: Provider: Department:  Dept Phone: Center:   04/27/2012 2:00 PM [facility] Main Office [place]) [phone] [facility]   07/21/2012 9:15 AM Duke Salvia, MD [facility] Main Office [place]) [phone] [facility]     Follow-up Information    Follow up with [name], MD. On 07/21/2012. (9:15 AM)    Contact information:   [address] [place] [address] [phone]       Follow up with [facility]. On 04/27/2012. (2:00 PM)    Contact information:   [address] [place] [phone]         Duration of Discharge Encounter: Greater than 30 minutes including physician time.  Signed, Gypsy Balsam, RN, BSN 04/16/2012, 2:58 PM   Hillis Range, MD

## 2012-04-17 ENCOUNTER — Telehealth: Payer: Self-pay | Admitting: Internal Medicine

## 2012-04-17 NOTE — Telephone Encounter (Signed)
Pt needs to know when to take bandage off

## 2012-04-17 NOTE — Telephone Encounter (Signed)
Pt had several questions in regards to pacemaker implanted/kwm

## 2012-04-20 ENCOUNTER — Telehealth: Payer: Self-pay | Admitting: Internal Medicine

## 2012-04-20 NOTE — Telephone Encounter (Signed)
Pt has sinus infection and has tried a few things that aren't working, pt calling to see what she can take? pls call  [phone]

## 2012-04-20 NOTE — Telephone Encounter (Signed)
Pt wasn't sure if she needed Dr Odessa Fleming ok to go on a certain type of antibiotic since she has a pacemaker now, pt informed she can go to pcp and that there are not any limits to antibiotics prescribed due to the pacemaker, pt verbalized understanding.

## 2012-04-24 ENCOUNTER — Telehealth: Payer: Self-pay | Admitting: Internal Medicine

## 2012-04-24 NOTE — Telephone Encounter (Signed)
Pt had pacer put in on 04/15/12, since then pt feels like she is going to pass out, very light headed over the last week.  Pt thought it was just a episode that would pass but it has not gotten any better since she left the hospital.  Pt has appnt on Monday but is very concerned about the weekend. She can be reached at [phone].

## 2012-04-24 NOTE — Telephone Encounter (Signed)
**Note De-Identified Charlene Barker Obfuscation** Per Dr. Johney Frame pt. is given reassurance and advised to continue to take Meclizine for dizziness, monitor BP and to contact her PCP, Dr. Renae Gloss, or her primary care cardiologist, Dr. Jacinto Halim. Pt. has appt. scheduled here on Monday 05/29/11 for wound and pacer check. When pt. was advised of Dr. Jenel Lucks recommendations she stated "I don't want to see or take advise from Dr. Johney Frame, Dr. Graciela Husbands is my doctor". I explained to her that Dr. Graciela Husbands is not in the office today and that Dr. Johney Frame is also one of our EP cardiologist. She stated "he is the one that came in my room after my surgery to get my pacemaker and that upset me because he is not my doctor". When asked why she seems so upset she stated that she is upset over her continued dizziness and that she really does not have anything against Dr. Johney Frame or me. I offered to call her primary cardiologist, Dr. Verl Dicker office on her behalf but she refused stating that she was on the phone with them when I called her. She states she will contact Dr. Jacinto Halim and that if need be she will go to ER over the weekend if she continues to have dizziness.

## 2012-04-27 ENCOUNTER — Encounter: Payer: Self-pay | Admitting: Internal Medicine

## 2012-04-27 ENCOUNTER — Ambulatory Visit (INDEPENDENT_AMBULATORY_CARE_PROVIDER_SITE_OTHER): Payer: Medicare Other | Admitting: *Deleted

## 2012-04-27 DIAGNOSIS — I442 Atrioventricular block, complete: Secondary | ICD-10-CM

## 2012-04-27 DIAGNOSIS — Z950 Presence of cardiac pacemaker: Secondary | ICD-10-CM | POA: Diagnosis not present

## 2012-04-27 LAB — PACEMAKER DEVICE OBSERVATION
AL AMPLITUDE: 3.1 mv
AL IMPEDENCE PM: 440 Ohm
AL THRESHOLD: 0.5 V
ATRIAL PACING PM: 1
BATTERY VOLTAGE: 2.98 V
DEVICE MODEL PM: 7423192
RV LEAD AMPLITUDE: 12 mv
RV LEAD IMPEDENCE PM: 580 Ohm
RV LEAD THRESHOLD: 0.5 V
VENTRICULAR PACING PM: 1

## 2012-04-27 NOTE — Progress Notes (Signed)
Patient presents for wound check for recently implanted pacemaker.   Wound well healed.  Pt c/o dizziness.  Pt recovering from sinus infection which she has been taking a z-pak for.  Pt in sinus rhythm today. BP 140/72.  Pt has been off of Acebutalol since hospital.  Pt reassured that pacemaker is working normally.    Device interrogated and found to be functioning normally.  No changes made today.  See PaceArt report for full details.  Plan ROV with Dr. Graciela Husbands in 3 months.  Gypsy Balsam, RN, BSN 04/27/2012 3:06 PM

## 2012-04-28 DIAGNOSIS — J019 Acute sinusitis, unspecified: Secondary | ICD-10-CM | POA: Diagnosis not present

## 2012-05-05 DIAGNOSIS — H4011X0 Primary open-angle glaucoma, stage unspecified: Secondary | ICD-10-CM | POA: Diagnosis not present

## 2012-05-05 DIAGNOSIS — H409 Unspecified glaucoma: Secondary | ICD-10-CM | POA: Diagnosis not present

## 2012-05-06 DIAGNOSIS — I10 Essential (primary) hypertension: Secondary | ICD-10-CM | POA: Diagnosis not present

## 2012-05-06 DIAGNOSIS — I447 Left bundle-branch block, unspecified: Secondary | ICD-10-CM | POA: Diagnosis not present

## 2012-05-06 DIAGNOSIS — I495 Sick sinus syndrome: Secondary | ICD-10-CM | POA: Diagnosis not present

## 2012-05-25 DIAGNOSIS — I495 Sick sinus syndrome: Secondary | ICD-10-CM | POA: Diagnosis not present

## 2012-05-25 DIAGNOSIS — I447 Left bundle-branch block, unspecified: Secondary | ICD-10-CM | POA: Diagnosis not present

## 2012-05-25 DIAGNOSIS — I10 Essential (primary) hypertension: Secondary | ICD-10-CM | POA: Diagnosis not present

## 2012-06-03 DIAGNOSIS — I10 Essential (primary) hypertension: Secondary | ICD-10-CM | POA: Diagnosis not present

## 2012-06-03 DIAGNOSIS — H26499 Other secondary cataract, unspecified eye: Secondary | ICD-10-CM | POA: Diagnosis not present

## 2012-06-03 DIAGNOSIS — H4011X0 Primary open-angle glaucoma, stage unspecified: Secondary | ICD-10-CM | POA: Diagnosis not present

## 2012-06-06 DIAGNOSIS — H04129 Dry eye syndrome of unspecified lacrimal gland: Secondary | ICD-10-CM | POA: Diagnosis not present

## 2012-06-06 DIAGNOSIS — Z961 Presence of intraocular lens: Secondary | ICD-10-CM | POA: Diagnosis not present

## 2012-06-06 DIAGNOSIS — H259 Unspecified age-related cataract: Secondary | ICD-10-CM | POA: Diagnosis not present

## 2012-06-06 DIAGNOSIS — H4011X0 Primary open-angle glaucoma, stage unspecified: Secondary | ICD-10-CM | POA: Diagnosis not present

## 2012-06-09 DIAGNOSIS — R7309 Other abnormal glucose: Secondary | ICD-10-CM | POA: Diagnosis not present

## 2012-06-16 DIAGNOSIS — H4011X0 Primary open-angle glaucoma, stage unspecified: Secondary | ICD-10-CM | POA: Diagnosis not present

## 2012-06-16 DIAGNOSIS — H04129 Dry eye syndrome of unspecified lacrimal gland: Secondary | ICD-10-CM | POA: Diagnosis not present

## 2012-06-16 DIAGNOSIS — H259 Unspecified age-related cataract: Secondary | ICD-10-CM | POA: Diagnosis not present

## 2012-06-16 DIAGNOSIS — Z961 Presence of intraocular lens: Secondary | ICD-10-CM | POA: Diagnosis not present

## 2012-06-24 DIAGNOSIS — J019 Acute sinusitis, unspecified: Secondary | ICD-10-CM | POA: Diagnosis not present

## 2012-06-24 DIAGNOSIS — I10 Essential (primary) hypertension: Secondary | ICD-10-CM | POA: Diagnosis not present

## 2012-07-01 DIAGNOSIS — I10 Essential (primary) hypertension: Secondary | ICD-10-CM | POA: Diagnosis not present

## 2012-07-01 DIAGNOSIS — I495 Sick sinus syndrome: Secondary | ICD-10-CM | POA: Diagnosis not present

## 2012-07-01 DIAGNOSIS — R7309 Other abnormal glucose: Secondary | ICD-10-CM | POA: Diagnosis not present

## 2012-07-01 DIAGNOSIS — I447 Left bundle-branch block, unspecified: Secondary | ICD-10-CM | POA: Diagnosis not present

## 2012-07-02 DIAGNOSIS — J3089 Other allergic rhinitis: Secondary | ICD-10-CM | POA: Diagnosis not present

## 2012-07-02 DIAGNOSIS — R7309 Other abnormal glucose: Secondary | ICD-10-CM | POA: Diagnosis not present

## 2012-07-02 DIAGNOSIS — I10 Essential (primary) hypertension: Secondary | ICD-10-CM | POA: Diagnosis not present

## 2012-07-02 DIAGNOSIS — F411 Generalized anxiety disorder: Secondary | ICD-10-CM | POA: Diagnosis not present

## 2012-07-21 ENCOUNTER — Encounter: Payer: Medicare Other | Admitting: Internal Medicine

## 2012-07-28 ENCOUNTER — Encounter: Payer: Medicare Other | Admitting: Internal Medicine

## 2012-08-11 DIAGNOSIS — H4011X0 Primary open-angle glaucoma, stage unspecified: Secondary | ICD-10-CM | POA: Diagnosis not present

## 2012-08-11 DIAGNOSIS — H259 Unspecified age-related cataract: Secondary | ICD-10-CM | POA: Diagnosis not present

## 2012-08-11 DIAGNOSIS — Z961 Presence of intraocular lens: Secondary | ICD-10-CM | POA: Diagnosis not present

## 2012-08-11 DIAGNOSIS — H04129 Dry eye syndrome of unspecified lacrimal gland: Secondary | ICD-10-CM | POA: Diagnosis not present

## 2012-08-11 DIAGNOSIS — H409 Unspecified glaucoma: Secondary | ICD-10-CM | POA: Diagnosis not present

## 2012-08-13 ENCOUNTER — Encounter: Payer: Self-pay | Admitting: Internal Medicine

## 2012-08-13 ENCOUNTER — Ambulatory Visit (INDEPENDENT_AMBULATORY_CARE_PROVIDER_SITE_OTHER): Payer: Medicare Other | Admitting: Internal Medicine

## 2012-08-13 VITALS — BP 145/82 | HR 75 | Ht 64.5 in | Wt 142.0 lb

## 2012-08-13 DIAGNOSIS — Z950 Presence of cardiac pacemaker: Secondary | ICD-10-CM | POA: Diagnosis not present

## 2012-08-13 DIAGNOSIS — R55 Syncope and collapse: Secondary | ICD-10-CM

## 2012-08-13 DIAGNOSIS — F411 Generalized anxiety disorder: Secondary | ICD-10-CM | POA: Diagnosis not present

## 2012-08-13 DIAGNOSIS — I442 Atrioventricular block, complete: Secondary | ICD-10-CM | POA: Diagnosis not present

## 2012-08-13 DIAGNOSIS — F419 Anxiety disorder, unspecified: Secondary | ICD-10-CM | POA: Insufficient documentation

## 2012-08-13 LAB — PACEMAKER DEVICE OBSERVATION
AL AMPLITUDE: 5 mv
AL IMPEDENCE PM: 462.5 Ohm
AL THRESHOLD: 0.5 V
ATRIAL PACING PM: 3.6
BAMS-0001: 150 {beats}/min
BAMS-0003: 70 {beats}/min
BATTERY VOLTAGE: 2.9779 V
DEVICE MODEL PM: 7423192
RV LEAD AMPLITUDE: 12 mv
RV LEAD IMPEDENCE PM: 612.5 Ohm
RV LEAD THRESHOLD: 0.75 V
VENTRICULAR PACING PM: 1

## 2012-08-13 NOTE — Assessment & Plan Note (Signed)
Re ated to device implantation--i am so sorry

## 2012-08-13 NOTE — Assessment & Plan Note (Signed)
<  1% v pacing

## 2012-08-13 NOTE — Progress Notes (Signed)
Patient Care Team: Merlene Laughter. Renae Gloss, MD as PCP - General (Internal Medicine)   HPI  Charlene Barker is a 67 y.o. female Seen in followup for syncope for which loop recorder was implanted in the setting of left bundle branch block. This demonstrated intermittent complete heart block and she underwent pacemaker implantation 12/13i  No recurrent syncope; but some lightheadedness  She relates the horrific experience that it was for her having the pacemaker implanatation as compared to loop insertion.  Her IV apparently at the end of the case was found to be nonfunctioning It has been for her terrible  Past Medical History  Diagnosis Date  . HTN (hypertension)   . Syncope   . Left bundle branch block   . Lightheadedness     Associated with exercise    Past Surgical History  Procedure Laterality Date  . Bladder surgery      Current Outpatient Prescriptions  Medication Sig Dispense Refill  . ALPRAZolam (XANAX) 0.25 MG tablet Take 0.25 mg by mouth 4 (four) times daily.      . Ascorbic Acid (VITAMIN C) 1000 MG tablet Take 1,000 mg by mouth daily.      Marland Kitchen CALCIUM-MAGNESIUM-ZINC PO Take 1 capsule by mouth daily.      . Cholecalciferol (VITAMIN D3) 2000 UNITS capsule Take 2,000 Units by mouth daily.      Marland Kitchen escitalopram (LEXAPRO) 10 MG tablet Take 10 mg by mouth daily.      Marland Kitchen ibuprofen (ADVIL,MOTRIN) 200 MG tablet Take 200 mg by mouth every 6 (six) hours as needed. For pain      . latanoprost (XALATAN) 0.005 % ophthalmic solution Place 1 drop into both eyes at bedtime.       . meclizine (ANTIVERT) 25 MG tablet Take 25 mg by mouth 3 (three) times daily as needed. For dizziness/vertigo      . Multiple Vitamin (MULITIVITAMIN WITH MINERALS) TABS Take 1 tablet by mouth daily.      Marland Kitchen neomycin-polymyxin-dexamethasone (MAXITROL) 0.1 % ophthalmic suspension 1 drop. Takes 4 gtts in EARS prn      . spironolactone (ALDACTONE) 25 MG tablet Take 25 mg by mouth daily.      . timolol (BETIMOL) 0.5 %  ophthalmic solution Place 1 drop into both eyes every morning.        No current facility-administered medications for this visit.    Allergies  Allergen Reactions  . Penicillins   . Sulfa Drugs Cross Reactors   . Chlorhexidine Rash    Review of Systems negative except from HPI and PMH  Physical Exam BP 145/82  Pulse 75  Ht 5' 4.5" (1.638 m)  Wt 142 lb (64.411 kg)  BMI 24.01 kg/m2 Well developed and nourished in no acute distress HENT normal Neck supple with JVP-flat Clear Device pocket well healed; without hematoma or erythema Some atrophy at loop incision Regular rate and rhythm, no murmurs or gallops Abd-soft with active BS No Clubbing cyanosis edema Skin-warm and dry A & Oriented  Grossly normal sensory and motor function     Assessment and  Plan

## 2012-08-13 NOTE — Assessment & Plan Note (Signed)
No recurrent syncope Some lightheadedness which may be  vasomotor mechanism

## 2012-08-13 NOTE — Assessment & Plan Note (Signed)
The patient's device was interrogated and the information was fully reviewed.  The device was reprogrammed to  Maximize longevity 

## 2012-09-03 DIAGNOSIS — Z950 Presence of cardiac pacemaker: Secondary | ICD-10-CM | POA: Diagnosis not present

## 2012-09-17 DIAGNOSIS — R7309 Other abnormal glucose: Secondary | ICD-10-CM | POA: Diagnosis not present

## 2012-09-17 DIAGNOSIS — R42 Dizziness and giddiness: Secondary | ICD-10-CM | POA: Diagnosis not present

## 2012-09-17 DIAGNOSIS — J3089 Other allergic rhinitis: Secondary | ICD-10-CM | POA: Diagnosis not present

## 2012-09-17 DIAGNOSIS — I10 Essential (primary) hypertension: Secondary | ICD-10-CM | POA: Diagnosis not present

## 2012-09-17 DIAGNOSIS — Z79899 Other long term (current) drug therapy: Secondary | ICD-10-CM | POA: Diagnosis not present

## 2012-10-19 DIAGNOSIS — R635 Abnormal weight gain: Secondary | ICD-10-CM | POA: Diagnosis not present

## 2012-12-04 DIAGNOSIS — Z950 Presence of cardiac pacemaker: Secondary | ICD-10-CM | POA: Diagnosis not present

## 2012-12-31 DIAGNOSIS — I10 Essential (primary) hypertension: Secondary | ICD-10-CM | POA: Diagnosis not present

## 2012-12-31 DIAGNOSIS — R42 Dizziness and giddiness: Secondary | ICD-10-CM | POA: Diagnosis not present

## 2012-12-31 DIAGNOSIS — I447 Left bundle-branch block, unspecified: Secondary | ICD-10-CM | POA: Diagnosis not present

## 2012-12-31 DIAGNOSIS — I495 Sick sinus syndrome: Secondary | ICD-10-CM | POA: Diagnosis not present

## 2013-01-05 DIAGNOSIS — Z961 Presence of intraocular lens: Secondary | ICD-10-CM | POA: Diagnosis not present

## 2013-01-05 DIAGNOSIS — H259 Unspecified age-related cataract: Secondary | ICD-10-CM | POA: Diagnosis not present

## 2013-01-05 DIAGNOSIS — H04129 Dry eye syndrome of unspecified lacrimal gland: Secondary | ICD-10-CM | POA: Diagnosis not present

## 2013-01-05 DIAGNOSIS — H4011X0 Primary open-angle glaucoma, stage unspecified: Secondary | ICD-10-CM | POA: Diagnosis not present

## 2013-01-05 DIAGNOSIS — H409 Unspecified glaucoma: Secondary | ICD-10-CM | POA: Diagnosis not present

## 2013-01-11 ENCOUNTER — Telehealth: Payer: Self-pay | Admitting: Internal Medicine

## 2013-01-11 NOTE — Telephone Encounter (Signed)
New Problem   Pt has a [employer] Transmitter/// Is going to switch from [employer] to [employer] telephone service and wants to know if it can be transmitted through the the [facility] modem.

## 2013-01-11 NOTE — Telephone Encounter (Signed)
Patient instructed to call 800# for [email or web address] and see if it is compatible with [employer].

## 2013-01-12 ENCOUNTER — Other Ambulatory Visit: Payer: Self-pay | Admitting: Obstetrics & Gynecology

## 2013-01-12 DIAGNOSIS — N63 Unspecified lump in unspecified breast: Secondary | ICD-10-CM | POA: Diagnosis not present

## 2013-01-12 DIAGNOSIS — N644 Mastodynia: Secondary | ICD-10-CM

## 2013-01-12 DIAGNOSIS — N6314 Unspecified lump in the right breast, lower inner quadrant: Secondary | ICD-10-CM

## 2013-01-13 ENCOUNTER — Ambulatory Visit
Admission: RE | Admit: 2013-01-13 | Discharge: 2013-01-13 | Disposition: A | Discharge status: Home / Self Care | Payer: Medicare Other | Source: Ambulatory Visit | Attending: Obstetrics & Gynecology | Admitting: Obstetrics & Gynecology

## 2013-01-13 DIAGNOSIS — N644 Mastodynia: Secondary | ICD-10-CM

## 2013-01-13 DIAGNOSIS — N6314 Unspecified lump in the right breast, lower inner quadrant: Secondary | ICD-10-CM

## 2013-01-13 DIAGNOSIS — N6009 Solitary cyst of unspecified breast: Secondary | ICD-10-CM | POA: Diagnosis not present

## 2013-01-14 ENCOUNTER — Other Ambulatory Visit: Payer: Medicare Other

## 2013-01-22 DIAGNOSIS — N951 Menopausal and female climacteric states: Secondary | ICD-10-CM | POA: Diagnosis not present

## 2013-01-25 DIAGNOSIS — I10 Essential (primary) hypertension: Secondary | ICD-10-CM | POA: Diagnosis not present

## 2013-01-25 DIAGNOSIS — Z79899 Other long term (current) drug therapy: Secondary | ICD-10-CM | POA: Diagnosis not present

## 2013-01-25 DIAGNOSIS — F411 Generalized anxiety disorder: Secondary | ICD-10-CM | POA: Diagnosis not present

## 2013-01-25 DIAGNOSIS — R7309 Other abnormal glucose: Secondary | ICD-10-CM | POA: Diagnosis not present

## 2013-01-25 DIAGNOSIS — Z0000 Encounter for general adult medical examination without abnormal findings: Secondary | ICD-10-CM | POA: Diagnosis not present

## 2013-01-25 DIAGNOSIS — E559 Vitamin D deficiency, unspecified: Secondary | ICD-10-CM | POA: Diagnosis not present

## 2013-01-27 DIAGNOSIS — J310 Chronic rhinitis: Secondary | ICD-10-CM | POA: Diagnosis not present

## 2013-01-27 DIAGNOSIS — R42 Dizziness and giddiness: Secondary | ICD-10-CM | POA: Diagnosis not present

## 2013-01-27 DIAGNOSIS — H903 Sensorineural hearing loss, bilateral: Secondary | ICD-10-CM | POA: Diagnosis not present

## 2013-02-02 DIAGNOSIS — Z1211 Encounter for screening for malignant neoplasm of colon: Secondary | ICD-10-CM | POA: Diagnosis not present

## 2013-02-04 ENCOUNTER — Encounter: Payer: Self-pay | Admitting: Cardiovascular Disease

## 2013-02-17 DIAGNOSIS — N318 Other neuromuscular dysfunction of bladder: Secondary | ICD-10-CM | POA: Diagnosis not present

## 2013-03-03 DIAGNOSIS — Z79899 Other long term (current) drug therapy: Secondary | ICD-10-CM | POA: Diagnosis not present

## 2013-03-03 DIAGNOSIS — I10 Essential (primary) hypertension: Secondary | ICD-10-CM | POA: Diagnosis not present

## 2013-03-03 DIAGNOSIS — I495 Sick sinus syndrome: Secondary | ICD-10-CM | POA: Diagnosis not present

## 2013-03-03 DIAGNOSIS — R079 Chest pain, unspecified: Secondary | ICD-10-CM | POA: Diagnosis not present

## 2013-03-03 DIAGNOSIS — F411 Generalized anxiety disorder: Secondary | ICD-10-CM | POA: Diagnosis not present

## 2013-03-03 DIAGNOSIS — M940 Chondrocostal junction syndrome [Tietze]: Secondary | ICD-10-CM | POA: Diagnosis not present

## 2013-03-16 DIAGNOSIS — H4011X0 Primary open-angle glaucoma, stage unspecified: Secondary | ICD-10-CM | POA: Diagnosis not present

## 2013-03-16 DIAGNOSIS — Z961 Presence of intraocular lens: Secondary | ICD-10-CM | POA: Diagnosis not present

## 2013-03-16 DIAGNOSIS — H259 Unspecified age-related cataract: Secondary | ICD-10-CM | POA: Diagnosis not present

## 2013-03-16 DIAGNOSIS — H04129 Dry eye syndrome of unspecified lacrimal gland: Secondary | ICD-10-CM | POA: Diagnosis not present

## 2013-04-08 DIAGNOSIS — Z950 Presence of cardiac pacemaker: Secondary | ICD-10-CM | POA: Diagnosis not present

## 2013-04-12 ENCOUNTER — Telehealth: Payer: Self-pay | Admitting: Internal Medicine

## 2013-04-12 NOTE — Telephone Encounter (Signed)
New message    Pt having near syncope episodes.  She is due for a dec Dr Graciela Husbands appt--but there are no openings.  I offered her tomorrow with Nehemiah Settle but she cannot come.  She want a nurse to call her.

## 2013-04-12 NOTE — Telephone Encounter (Signed)
Pt states that she feels like she did before her PPM insertion. Several recent near syncopal episodes: 12/3, 12/4, 12/6, & 12/14. Pt encouraged to come in and see Rick Duff, [facility] tomorrow to follow up - I scheduled her for 10:30 am. Pt agreeable to plan.

## 2013-04-13 ENCOUNTER — Encounter: Payer: Self-pay | Admitting: Cardiology

## 2013-04-13 ENCOUNTER — Ambulatory Visit (INDEPENDENT_AMBULATORY_CARE_PROVIDER_SITE_OTHER): Payer: Medicare Other | Admitting: Cardiology

## 2013-04-13 ENCOUNTER — Encounter: Payer: Self-pay | Admitting: Internal Medicine

## 2013-04-13 VITALS — BP 128/70 | HR 72 | Ht 64.5 in | Wt 143.0 lb

## 2013-04-13 DIAGNOSIS — I442 Atrioventricular block, complete: Secondary | ICD-10-CM

## 2013-04-13 DIAGNOSIS — Z950 Presence of cardiac pacemaker: Secondary | ICD-10-CM | POA: Diagnosis not present

## 2013-04-13 DIAGNOSIS — R42 Dizziness and giddiness: Secondary | ICD-10-CM

## 2013-04-13 LAB — MDC_IDC_ENUM_SESS_TYPE_INCLINIC
Battery Remaining Longevity: 111.6 mo
Battery Voltage: 2.95 V
Brady Statistic RA Percent Paced: 1 %
Brady Statistic RV Percent Paced: 0.28 %
Date Time Interrogation Session: 20141216125339
Implantable Pulse Generator Model: 2110
Implantable Pulse Generator Serial Number: 7423192
Lead Channel Impedance Value: 462.5 Ohm
Lead Channel Impedance Value: 712.5 Ohm
Lead Channel Pacing Threshold Amplitude: 0.5 V
Lead Channel Pacing Threshold Amplitude: 0.5 V
Lead Channel Pacing Threshold Amplitude: 0.75 V
Lead Channel Pacing Threshold Amplitude: 0.75 V
Lead Channel Pacing Threshold Pulse Width: 0.5 ms
Lead Channel Pacing Threshold Pulse Width: 0.5 ms
Lead Channel Pacing Threshold Pulse Width: 0.5 ms
Lead Channel Pacing Threshold Pulse Width: 0.5 ms
Lead Channel Sensing Intrinsic Amplitude: 12 mV
Lead Channel Sensing Intrinsic Amplitude: 5 mV
Lead Channel Setting Pacing Amplitude: 2 V
Lead Channel Setting Pacing Amplitude: 2.5 V
Lead Channel Setting Pacing Pulse Width: 0.5 ms
Lead Channel Setting Sensing Sensitivity: 2 mV

## 2013-04-13 NOTE — Progress Notes (Signed)
Patient ID: MONDA CHASTAIN MRN: [id number], DOB/AGE: 1945/10/05   Date of Visit: 04/13/2013  Primary Physician: Alva Garnet., MD Primary Cardiologist: Jacinto Halim, MD Primary EP: Graciela Husbands, MD Reason for Visit: EP/device follow-up  History of Present Illness  Charlene Barker is a 67 y.o. female with history of syncope for which a loop recorder was implanted in the setting of left bundle branch block. This demonstrated intermittent complete heart block and she underwent pacemaker implantation Dec 2013.   Since last being seen in our clinic, she reports intermittent lightheadedness but no syncope. This was discussed during her last office visit with Dr. Graciela Husbands at which time was felt to be due to vasomotor mechanism. She describes lightheadedness with standing and/or walking for long periods of time. She reports a couple of episodes occurring while walking around at [employer] [facility] [employer] [facility] [facility] [employer] store. She denies syncope. She denies chest pain or shortness of breath. She denies palpitations. She denies LE swelling, orthopnea or PND. She denies any recent changes to her medications and reports compliance.   Past Medical History Past Medical History  Diagnosis Date  . HTN (hypertension)   . Syncope   . Left bundle branch block   . Lightheadedness     Associated with exercise  . Atrioventricular block, complete -intermittent   . Pacemaker- [employer]     DOI 12/13    Past Surgical History Past Surgical History  Procedure Laterality Date  . Bladder surgery      Allergies/Intolerances Allergies  Allergen Reactions  . Penicillins   . Sulfa Drugs Cross Reactors   . Chlorhexidine Rash    Current Home Medications Current Outpatient Prescriptions  Medication Sig Dispense Refill  . ALPRAZolam (XANAX) 0.25 MG tablet Take 0.25 mg by mouth 3 (three) times daily as needed.       . Ascorbic Acid (VITAMIN C) 1000 MG tablet Take 1,000 mg by mouth daily.      Marland Kitchen CALCIUM-MAGNESIUM-ZINC PO Take 1 capsule  by mouth daily.      . Cholecalciferol (VITAMIN D3) 2000 UNITS capsule Take 2,000 Units by mouth daily.      Marland Kitchen escitalopram (LEXAPRO) 10 MG tablet Take 10 mg by mouth daily.      . fluticasone (FLONASE) 50 MCG/ACT nasal spray Place 1 spray into both nostrils daily.      Marland Kitchen ibuprofen (ADVIL,MOTRIN) 200 MG tablet Take 200 mg by mouth every 6 (six) hours as needed. For pain      . latanoprost (XALATAN) 0.005 % ophthalmic solution Place 1 drop into both eyes at bedtime.       . meclizine (ANTIVERT) 25 MG tablet Take 25 mg by mouth 3 (three) times daily as needed. For dizziness/vertigo      . Multiple Vitamin (MULITIVITAMIN WITH MINERALS) TABS Take 1 tablet by mouth daily.      Marland Kitchen neomycin-polymyxin-dexamethasone (MAXITROL) 0.1 % ophthalmic suspension 1 drop. Takes 4 gtts in EARS prn      . spironolactone (ALDACTONE) 25 MG tablet Take 25 mg by mouth daily.      . timolol (BETIMOL) 0.5 % ophthalmic solution Place 1 drop into both eyes every morning.        No current facility-administered medications for this visit.    Social History History   Social History  . Marital Status: Married    Spouse Name: N/A    Number of Children: N/A  . Years of Education: N/A   Occupational History  . Not on file.   Social  History Main Topics  . Smoking status: Never Smoker   . Smokeless tobacco: Not on file  . Alcohol Use: Not on file  . Drug Use: No  . Sexual Activity: Not on file   Other Topics Concern  . Not on file   Social History Narrative  . No narrative on file     Review of Systems General: No chills, fever, night sweats or weight changes Cardiovascular: No chest pain, dyspnea on exertion, edema, orthopnea, palpitations, paroxysmal nocturnal dyspnea Dermatological: No rash, lesions or masses Respiratory: No cough, dyspnea Urologic: No hematuria, dysuria Abdominal: No nausea, vomiting, diarrhea, bright red blood per rectum, melena, or hematemesis Neurologic: No visual changes, weakness,  changes in mental status All other systems reviewed and are otherwise negative except as noted above.  Physical Exam Vitals: Blood pressure 128/70, pulse 72, height 5' 4.5" (1.638 m), weight 143 lb (64.864 kg).  General: Well developed, well appearing 67 y.o. female in no acute distress. HEENT: Normocephalic, atraumatic. EOMs intact. Sclera nonicteric. Oropharynx clear.  Neck: Supple without bruits. No JVD. Lungs: Respirations regular and unlabored, CTA bilaterally. No wheezes, rales or rhonchi. Heart: RRR. S1, S2 present. No murmurs, rub, S3 or S4. Abdomen: Soft, non-tender, non-distended. BS present x 4 quadrants. No hepatosplenomegaly.  Extremities: No clubbing, cyanosis or edema. DP/PT/Radials 2+ and equal bilaterally. Psych: Normal affect. Neuro: Alert and oriented X 3. Moves all extremities spontaneously.   Diagnostics 12-lead ECG today - SR at 72 bpm with incomplete LBBB, one PVC Orthostatics -       Lying BP 138/74 pulse 64      Sitting BP 124/78 pulse 72      Standing - 0 minutes - BP 120/78 pulse 70                      - 2 minutes - BP 124/74 pulse 67           - 5 minutes - not recorded              Device interrogation today - Normal device function. Thresholds, sensing, impedances consistent with previous measurements. Device programmed to maximize longevity. 264 mode switch episodes, all EGMs reviewed and show atrial noise. Not reproducible in clinic. No high ventricular rates noted. Device programmed at appropriate safety margins. Histogram distribution appropriate for patient activity level. Device programmed to optimize intrinsic conduction. Estimated longevity 7.1 - 9.3 years.   Assessment and Plan 1. Lightheadedness  - mostly positional, with standing and/or while walking - orthostatics not markedly abnormal but patient reported typical symptoms with standing - counseled regarding the importance of adequate hydration and counterpressure measures - she was instructed  to report worsening symptoms or recurrent syncope 2. Transient CHB s/p PPM implant - normal device function - no programming changes made - return for follow-up with Dr. Graciela Husbands as scheduled in 3 months  Signed, Rick Duff, PA-C 04/13/2013, 11:45 AM

## 2013-04-13 NOTE — Patient Instructions (Addendum)
Your physician recommends that you continue on your current medications as directed. Please refer to the Current Medication list given to you today.  Your physician recommends that you schedule a follow-up appointment in: 3 months with dr. Graciela Husbands

## 2013-05-18 DIAGNOSIS — H409 Unspecified glaucoma: Secondary | ICD-10-CM | POA: Diagnosis not present

## 2013-05-18 DIAGNOSIS — H4011X0 Primary open-angle glaucoma, stage unspecified: Secondary | ICD-10-CM | POA: Diagnosis not present

## 2013-05-18 DIAGNOSIS — H259 Unspecified age-related cataract: Secondary | ICD-10-CM | POA: Diagnosis not present

## 2013-05-18 DIAGNOSIS — H35359 Cystoid macular degeneration, unspecified eye: Secondary | ICD-10-CM | POA: Diagnosis not present

## 2013-05-26 DIAGNOSIS — N764 Abscess of vulva: Secondary | ICD-10-CM | POA: Diagnosis not present

## 2013-06-03 DIAGNOSIS — N9089 Other specified noninflammatory disorders of vulva and perineum: Secondary | ICD-10-CM | POA: Diagnosis not present

## 2013-07-13 DIAGNOSIS — H35359 Cystoid macular degeneration, unspecified eye: Secondary | ICD-10-CM | POA: Diagnosis not present

## 2013-07-13 DIAGNOSIS — H4011X0 Primary open-angle glaucoma, stage unspecified: Secondary | ICD-10-CM | POA: Diagnosis not present

## 2013-07-13 DIAGNOSIS — H259 Unspecified age-related cataract: Secondary | ICD-10-CM | POA: Diagnosis not present

## 2013-07-13 DIAGNOSIS — Z961 Presence of intraocular lens: Secondary | ICD-10-CM | POA: Diagnosis not present

## 2013-07-15 ENCOUNTER — Encounter: Payer: TRICARE For Life (TFL) | Admitting: Internal Medicine

## 2013-07-27 DIAGNOSIS — R42 Dizziness and giddiness: Secondary | ICD-10-CM | POA: Diagnosis not present

## 2013-07-27 DIAGNOSIS — Z79899 Other long term (current) drug therapy: Secondary | ICD-10-CM | POA: Diagnosis not present

## 2013-07-27 DIAGNOSIS — I10 Essential (primary) hypertension: Secondary | ICD-10-CM | POA: Diagnosis not present

## 2013-08-09 DIAGNOSIS — Z961 Presence of intraocular lens: Secondary | ICD-10-CM | POA: Diagnosis not present

## 2013-08-09 DIAGNOSIS — H409 Unspecified glaucoma: Secondary | ICD-10-CM | POA: Diagnosis not present

## 2013-08-09 DIAGNOSIS — H4011X0 Primary open-angle glaucoma, stage unspecified: Secondary | ICD-10-CM | POA: Diagnosis not present

## 2013-08-09 DIAGNOSIS — H35359 Cystoid macular degeneration, unspecified eye: Secondary | ICD-10-CM | POA: Diagnosis not present

## 2013-08-10 DIAGNOSIS — H4011X0 Primary open-angle glaucoma, stage unspecified: Secondary | ICD-10-CM | POA: Diagnosis not present

## 2013-08-10 DIAGNOSIS — H35359 Cystoid macular degeneration, unspecified eye: Secondary | ICD-10-CM | POA: Diagnosis not present

## 2013-08-10 DIAGNOSIS — H409 Unspecified glaucoma: Secondary | ICD-10-CM | POA: Diagnosis not present

## 2013-08-10 DIAGNOSIS — Z961 Presence of intraocular lens: Secondary | ICD-10-CM | POA: Diagnosis not present

## 2013-08-11 DIAGNOSIS — Z950 Presence of cardiac pacemaker: Secondary | ICD-10-CM | POA: Diagnosis not present

## 2013-08-27 ENCOUNTER — Encounter: Payer: Self-pay | Admitting: Internal Medicine

## 2013-08-27 ENCOUNTER — Encounter: Payer: Self-pay | Admitting: *Deleted

## 2013-08-27 ENCOUNTER — Ambulatory Visit (INDEPENDENT_AMBULATORY_CARE_PROVIDER_SITE_OTHER): Payer: Medicare Other | Admitting: Internal Medicine

## 2013-08-27 VITALS — BP 154/88 | HR 69 | Ht 64.5 in | Wt 142.0 lb

## 2013-08-27 DIAGNOSIS — I442 Atrioventricular block, complete: Secondary | ICD-10-CM | POA: Diagnosis not present

## 2013-08-27 DIAGNOSIS — T82190A Other mechanical complication of cardiac electrode, initial encounter: Secondary | ICD-10-CM | POA: Insufficient documentation

## 2013-08-27 DIAGNOSIS — Z950 Presence of cardiac pacemaker: Secondary | ICD-10-CM | POA: Diagnosis not present

## 2013-08-27 HISTORY — DX: Other mechanical complication of cardiac electrode, initial encounter: T82.190A

## 2013-08-27 LAB — BASIC METABOLIC PANEL
BUN: 17 mg/dL (ref 6–23)
CO2: 27 mEq/L (ref 19–32)
Calcium: 10.6 mg/dL — ABNORMAL HIGH (ref 8.4–10.5)
Chloride: 104 mEq/L (ref 96–112)
Creatinine, Ser: 0.6 mg/dL (ref 0.4–1.2)
GFR: 107.59 mL/min (ref >60.00)
Glucose, Bld: 82 mg/dL (ref 70–99)
Potassium: 5.3 mEq/L — ABNORMAL HIGH (ref 3.5–5.1)
Sodium: 140 mEq/L (ref 135–145)

## 2013-08-27 LAB — MDC_IDC_ENUM_SESS_TYPE_INCLINIC
Battery Remaining Longevity: 110.4 mo
Battery Voltage: 2.95 V
Brady Statistic RA Percent Paced: 1.3 %
Brady Statistic RV Percent Paced: 1.3 %
Date Time Interrogation Session: 20150501151439
Implantable Pulse Generator Model: 2110
Implantable Pulse Generator Serial Number: 7423192
Lead Channel Impedance Value: 487.5 Ohm
Lead Channel Impedance Value: 762.5 Ohm
Lead Channel Pacing Threshold Amplitude: 0.5 V
Lead Channel Pacing Threshold Amplitude: 0.5 V
Lead Channel Pacing Threshold Amplitude: 0.75 V
Lead Channel Pacing Threshold Amplitude: 0.75 V
Lead Channel Pacing Threshold Pulse Width: 0.5 ms
Lead Channel Pacing Threshold Pulse Width: 0.5 ms
Lead Channel Pacing Threshold Pulse Width: 0.5 ms
Lead Channel Pacing Threshold Pulse Width: 0.5 ms
Lead Channel Sensing Intrinsic Amplitude: 12 mV
Lead Channel Sensing Intrinsic Amplitude: 5 mV
Lead Channel Setting Pacing Amplitude: 2 V
Lead Channel Setting Pacing Amplitude: 2.5 V
Lead Channel Setting Pacing Pulse Width: 0.5 ms
Lead Channel Setting Sensing Sensitivity: 2 mV

## 2013-08-27 NOTE — Progress Notes (Signed)
Patient Care Team: Royetta Crochet. Karlton Lemon, MD as PCP - General (Internal Medicine)   HPI  Charlene Barker is a 68 y.o. female Seen in followup for pacemaker implanted for syncope left bundle branch block and recorder demonstrated intermittent complete heart block. This was done December 2013.  She has spells of intermittent dizziness. She has had no recurrent syncope. These episodes of dizziness recur upon prolonged standing but occasionally with sitting.  Past Medical History  Diagnosis Date  . HTN (hypertension)   . Syncope   . Left bundle branch block   . Lightheadedness     Associated with exercise  . Atrioventricular block, complete -intermittent   . Pacemaker- [place] 12/13    Past Surgical History  Procedure Laterality Date  . Bladder surgery      Current Outpatient Prescriptions  Medication Sig Dispense Refill  . Ascorbic Acid (VITAMIN C) 1000 MG tablet Take 1,000 mg by mouth daily.      Marland Kitchen aspirin 81 MG tablet Take 81 mg by mouth daily.      Marland Kitchen CALCIUM-MAGNESIUM-ZINC PO Take 1 capsule by mouth daily.      . Cholecalciferol (VITAMIN D3) 2000 UNITS capsule Take 2,000 Units by mouth daily.      . Cyanocobalamin (VITAMIN B-12 CR PO) Take by mouth daily.      . fluticasone (FLONASE) 50 MCG/ACT nasal spray Place 1 spray into both nostrils daily.      Marland Kitchen ibuprofen (ADVIL,MOTRIN) 200 MG tablet Take 200 mg by mouth every 6 (six) hours as needed. For pain      . latanoprost (XALATAN) 0.005 % ophthalmic solution Place 1 drop into both eyes at bedtime.       . meclizine (ANTIVERT) 25 MG tablet Take 25 mg by mouth 3 (three) times daily as needed. For dizziness/vertigo      . Multiple Vitamin (MULITIVITAMIN WITH MINERALS) TABS Take 1 tablet by mouth daily.      Marland Kitchen neomycin-polymyxin-dexamethasone (MAXITROL) 0.1 % ophthalmic suspension 1 drop. Takes 4 gtts in EARS prn      . Nepafenac (ILEVRO) 0.3 % SUSP Apply to eye daily.      . Nutritional Supplements (JUICE PLUS  FIBRE PO) Take 1 tablet by mouth 2 (two) times daily.      Marland Kitchen spironolactone (ALDACTONE) 25 MG tablet Take 25 mg by mouth daily.      . timolol (BETIMOL) 0.5 % ophthalmic solution Place 1 drop into both eyes every morning.        No current facility-administered medications for this visit.    Allergies  Allergen Reactions  . Penicillins   . Sulfa Drugs Cross Reactors   . Chlorhexidine Rash    Review of Systems negative except from HPI and PMH  Physical Exam BP 148/74  Pulse 72  Ht 5' 4.5" (1.638 m)  Wt 142 lb (64.411 kg)  BMI 24.01 kg/m2 Well developed and well nourished in no acute distress HENT normal E scleral and icterus clear Neck Supple JVP flat; carotids brisk and full Clear to ausculation regular rate and rhythm, no murmurs gallops or rub Soft with active bowel sounds No clubbing cyanosis  Edema Alert and oriented, grossly normal motor and sensory function Skin Warm and Dry  ECG demonstrates sinus rhythm with left bundle branch block and left axis deviation  Assessment and  Plan  Intermittent complete heart block  Atrial lead oversensing  Syncope now with presyncope  Pacemaker-[employer]ude  The patient's device was interrogated.  The information was reviewed. No changes were made in the programming.     Hypertension  No objective evidence of orthostatic intolerance,  Will have her followup with her PCP re dizziness  No evidence of arrhythmia  i dont think there is any symptom issue related to the atrial lead issue  i suspect this is either related to fracture or set screw but as her problem is is intermittent complete heart block, I don't think anything needs to be done about her atrial lead. In the event that she would develop complete heart block, we will need to solve the atrial lead issue so to allow P. synchronous pacing

## 2013-08-27 NOTE — Patient Instructions (Signed)
Your physician recommends that you return for lab work today: BMET  Your physician recommends that you continue on your current medications as directed. Please refer to the Current Medication list given to you today.  Your physician has requested that you have an exercise tolerance test. For further information please visit [email or web address]. Please also follow instruction sheet, as given.  Remote monitoring is used to monitor your Pacemaker of ICD from home. This monitoring reduces the number of office visits required to check your device to one time per year. It allows [place] to keep an eye on the functioning of your device to ensure it is working properly. You are scheduled for a device check from home on 12/02/2013. You may send your transmission at any time that day. If you have a wireless device, the transmission will be sent automatically. After your physician reviews your transmission, you will receive a postcard with your next transmission date.  Your physician wants you to follow-up in: 9 months with Dr. Caryl Comes.  You will receive a reminder letter in the mail two months in advance. If you don't receive a letter, please call our office to schedule the follow-up appointment.

## 2013-08-30 DIAGNOSIS — Z79899 Other long term (current) drug therapy: Secondary | ICD-10-CM | POA: Diagnosis not present

## 2013-08-30 DIAGNOSIS — I10 Essential (primary) hypertension: Secondary | ICD-10-CM | POA: Diagnosis not present

## 2013-08-30 DIAGNOSIS — F3289 Other specified depressive episodes: Secondary | ICD-10-CM | POA: Diagnosis not present

## 2013-08-31 DIAGNOSIS — N760 Acute vaginitis: Secondary | ICD-10-CM | POA: Diagnosis not present

## 2013-09-01 ENCOUNTER — Telehealth: Payer: Self-pay | Admitting: Internal Medicine

## 2013-09-01 NOTE — Telephone Encounter (Signed)
This was not nurse calling. Checkout was calling to reschedule pt's GXT (scheduled w/ wrong MD). Checkout is handling this matter.

## 2013-09-01 NOTE — Telephone Encounter (Signed)
New message     Returning a call to Dr Olin Pia nurse

## 2013-09-02 NOTE — Telephone Encounter (Signed)
Informed pt that I was calling to make sure GXT rescheduled -- it has been, 6/17. Pt verbalized understanding.

## 2013-09-10 DIAGNOSIS — M545 Low back pain, unspecified: Secondary | ICD-10-CM | POA: Diagnosis not present

## 2013-09-14 ENCOUNTER — Telehealth: Payer: Self-pay | Admitting: Internal Medicine

## 2013-09-14 DIAGNOSIS — Z961 Presence of intraocular lens: Secondary | ICD-10-CM | POA: Diagnosis not present

## 2013-09-14 DIAGNOSIS — H4011X0 Primary open-angle glaucoma, stage unspecified: Secondary | ICD-10-CM | POA: Diagnosis not present

## 2013-09-14 DIAGNOSIS — H259 Unspecified age-related cataract: Secondary | ICD-10-CM | POA: Diagnosis not present

## 2013-09-14 DIAGNOSIS — H04129 Dry eye syndrome of unspecified lacrimal gland: Secondary | ICD-10-CM | POA: Diagnosis not present

## 2013-09-14 NOTE — Telephone Encounter (Signed)
New problem    Pt returning this office's call please give her a call back.

## 2013-09-14 NOTE — Telephone Encounter (Signed)
Follow Up:  Pt states she is returning a call back to [place]. She is requesting Sherri call her cell twice if she does not answer it the first time

## 2013-09-14 NOTE — Telephone Encounter (Signed)
Advised to stop Aldactone per 5/1 lab result. Reviewed results with patient who verbalized understanding.

## 2013-09-16 ENCOUNTER — Telehealth: Payer: Self-pay | Admitting: Internal Medicine

## 2013-09-16 NOTE — Telephone Encounter (Signed)
Returned pt's call who was asking about starting another medication. I explained that we were not starting another med, only stopping secondary to hyperkalemia. Advised to call Dr. Einar Gip to discuss another BP medication alternative if needed. Pt is agreeable to this.

## 2013-09-16 NOTE — Telephone Encounter (Signed)
New message     Talk to Sherri----pt would not go into details----something about stopping her bp medication immediately. She has not had any bp medication for two days.  Please call again (back to back) if she does not answer the first time---she got a new phone

## 2013-09-23 DIAGNOSIS — I10 Essential (primary) hypertension: Secondary | ICD-10-CM | POA: Diagnosis not present

## 2013-09-23 DIAGNOSIS — M545 Low back pain, unspecified: Secondary | ICD-10-CM | POA: Diagnosis not present

## 2013-09-23 DIAGNOSIS — Z79899 Other long term (current) drug therapy: Secondary | ICD-10-CM | POA: Diagnosis not present

## 2013-10-05 ENCOUNTER — Telehealth: Payer: Self-pay | Admitting: Internal Medicine

## 2013-10-05 DIAGNOSIS — E875 Hyperkalemia: Secondary | ICD-10-CM

## 2013-10-05 NOTE — Telephone Encounter (Signed)
Discussed with pt abnormal Potassium at beginning of May and need to have f/u lab to see if improvement in abnormality.  Pt is scheduled for GXT next week and will have lab drawn while she is here that day. She is agreeable to this plan.

## 2013-10-05 NOTE — Telephone Encounter (Signed)
New message  ° ° °Returning call back to nurse.  °

## 2013-10-06 ENCOUNTER — Encounter: Payer: Medicare Other | Admitting: Nurse Practitioner

## 2013-10-08 DIAGNOSIS — J029 Acute pharyngitis, unspecified: Secondary | ICD-10-CM | POA: Diagnosis not present

## 2013-10-09 DIAGNOSIS — M48061 Spinal stenosis, lumbar region without neurogenic claudication: Secondary | ICD-10-CM | POA: Diagnosis not present

## 2013-10-12 ENCOUNTER — Telehealth: Payer: Self-pay | Admitting: Internal Medicine

## 2013-10-12 NOTE — Telephone Encounter (Signed)
Pt states that she has been feeling bad since last week and started abx Sunday.  She also recently received steroid injections for this. She would like to reschedule exercise tolerance testing and lab work for when she feels better. Advised this would be ok, cancelled procedure/test. Pt will call back to reschedule these.  She is agreeable to this.

## 2013-10-12 NOTE — Telephone Encounter (Signed)
New problem   Pt want to talk to nurse because she isn't feeling well and might not be able to come have test tomorrow. Please call pt at home first then cell last.

## 2013-10-13 ENCOUNTER — Other Ambulatory Visit: Payer: Medicare Other

## 2013-10-13 ENCOUNTER — Encounter: Payer: Medicare Other | Admitting: Internal Medicine

## 2013-10-15 DIAGNOSIS — M545 Low back pain, unspecified: Secondary | ICD-10-CM | POA: Diagnosis not present

## 2013-10-18 DIAGNOSIS — M545 Low back pain, unspecified: Secondary | ICD-10-CM | POA: Diagnosis not present

## 2013-10-25 DIAGNOSIS — M48061 Spinal stenosis, lumbar region without neurogenic claudication: Secondary | ICD-10-CM | POA: Diagnosis not present

## 2013-11-01 DIAGNOSIS — M48061 Spinal stenosis, lumbar region without neurogenic claudication: Secondary | ICD-10-CM | POA: Diagnosis not present

## 2013-11-02 DIAGNOSIS — H4011X0 Primary open-angle glaucoma, stage unspecified: Secondary | ICD-10-CM | POA: Diagnosis not present

## 2013-11-02 DIAGNOSIS — H259 Unspecified age-related cataract: Secondary | ICD-10-CM | POA: Diagnosis not present

## 2013-11-02 DIAGNOSIS — Z961 Presence of intraocular lens: Secondary | ICD-10-CM | POA: Diagnosis not present

## 2013-11-02 DIAGNOSIS — H04129 Dry eye syndrome of unspecified lacrimal gland: Secondary | ICD-10-CM | POA: Diagnosis not present

## 2013-11-04 DIAGNOSIS — M545 Low back pain, unspecified: Secondary | ICD-10-CM | POA: Diagnosis not present

## 2013-11-04 DIAGNOSIS — M48061 Spinal stenosis, lumbar region without neurogenic claudication: Secondary | ICD-10-CM | POA: Diagnosis not present

## 2013-11-08 ENCOUNTER — Telehealth: Payer: Self-pay | Admitting: Internal Medicine

## 2013-11-08 NOTE — Telephone Encounter (Signed)
Patient tells me that yesterday she had an episode in which she had a sharp pain from around PPM site down whole left side. She states that yesterday is was continuous and today certain movements make it worse. She states this is debilitating for her. I have explained that I would have our device team follow up with her, but that if they didn't feel this is device related that she should follow up with her PCP. She states that she doesn't currently have one, so advised to go to an urgent care.  She has verbalized understanding and agreeable to current plan.

## 2013-11-08 NOTE — Telephone Encounter (Signed)
°  Patient said its real important that she speak with you. Please call back.

## 2013-11-09 ENCOUNTER — Ambulatory Visit (INDEPENDENT_AMBULATORY_CARE_PROVIDER_SITE_OTHER): Payer: Medicare Other | Admitting: *Deleted

## 2013-11-09 DIAGNOSIS — Z950 Presence of cardiac pacemaker: Secondary | ICD-10-CM

## 2013-11-09 LAB — MDC_IDC_ENUM_SESS_TYPE_INCLINIC
Battery Remaining Longevity: 134.4 mo
Battery Voltage: 2.95 V
Brady Statistic RA Percent Paced: 2.6 %
Brady Statistic RV Percent Paced: 1.7 %
Date Time Interrogation Session: 20150714154144
Implantable Pulse Generator Model: 2110
Implantable Pulse Generator Serial Number: 7423192
Lead Channel Impedance Value: 387.5 Ohm
Lead Channel Impedance Value: 675 Ohm
Lead Channel Pacing Threshold Amplitude: 0.75 V
Lead Channel Pacing Threshold Amplitude: 0.75 V
Lead Channel Pacing Threshold Amplitude: 0.75 V
Lead Channel Pacing Threshold Amplitude: 0.75 V
Lead Channel Pacing Threshold Pulse Width: 0.5 ms
Lead Channel Pacing Threshold Pulse Width: 0.5 ms
Lead Channel Pacing Threshold Pulse Width: 0.5 ms
Lead Channel Pacing Threshold Pulse Width: 0.5 ms
Lead Channel Sensing Intrinsic Amplitude: 12 mV
Lead Channel Sensing Intrinsic Amplitude: 5 mV
Lead Channel Setting Pacing Amplitude: 2 V
Lead Channel Setting Pacing Amplitude: 2.5 V
Lead Channel Setting Pacing Pulse Width: 0.5 ms
Lead Channel Setting Sensing Sensitivity: 2 mV

## 2013-11-09 NOTE — Progress Notes (Signed)
Pt c/o stabbing pain from device pocket down into ribs on her left side.   Normal device function. Thresholds, sensing, impedances consistent with previous measurements. Device programmed to maximize longevity. 1.8% mode switch--EGMs show atrial noise. 1 high ventricular rates noted---was an SVT. Device programmed at appropriate safety margins. Histogram distribution appropriate for patient activity level. Device programmed to optimize intrinsic conduction. Estimated longevity 8.7-11.62yrs. [facility] followed by Dr. Einar Gip, ROV w/ device clinic 10/15, w/ GT 4/16.  I advised pt to go to ER if chest pains reoccur but pt very reluctant to go to ER for anything. I also strongly suggested to pt several times to follow up w/ her primary cardiologist (Dr. Einar Gip).  Pt also stated she has back problems and plans to get a "shot" for those symptoms soon.

## 2013-11-09 NOTE — Telephone Encounter (Signed)
Pt reiterated sharp shooting pain from pacer pocket & shoulder area through ribs on left side. Pt states pain was not fleeting, it lasted a while throughout Sunday. Pt also feels pain while laying on left side. Pt was implanted 03/2012. Pt no longer has a PCP and has not found a new one. I advised pt to find a PCP. Pt would like to be checked in device clinic for reassurance. Pt states she is also having back pain and back issues. I made appt today for 2:00 w/ device clinic.

## 2013-11-12 DIAGNOSIS — M5137 Other intervertebral disc degeneration, lumbosacral region: Secondary | ICD-10-CM | POA: Diagnosis not present

## 2013-11-12 DIAGNOSIS — S239XXA Sprain of unspecified parts of thorax, initial encounter: Secondary | ICD-10-CM | POA: Diagnosis not present

## 2013-11-12 DIAGNOSIS — S335XXA Sprain of ligaments of lumbar spine, initial encounter: Secondary | ICD-10-CM | POA: Diagnosis not present

## 2013-11-12 DIAGNOSIS — M999 Biomechanical lesion, unspecified: Secondary | ICD-10-CM | POA: Diagnosis not present

## 2013-11-15 DIAGNOSIS — M5137 Other intervertebral disc degeneration, lumbosacral region: Secondary | ICD-10-CM | POA: Diagnosis not present

## 2013-11-15 DIAGNOSIS — M999 Biomechanical lesion, unspecified: Secondary | ICD-10-CM | POA: Diagnosis not present

## 2013-11-15 DIAGNOSIS — S335XXA Sprain of ligaments of lumbar spine, initial encounter: Secondary | ICD-10-CM | POA: Diagnosis not present

## 2013-11-15 DIAGNOSIS — S239XXA Sprain of unspecified parts of thorax, initial encounter: Secondary | ICD-10-CM | POA: Diagnosis not present

## 2013-11-17 DIAGNOSIS — S239XXA Sprain of unspecified parts of thorax, initial encounter: Secondary | ICD-10-CM | POA: Diagnosis not present

## 2013-11-17 DIAGNOSIS — M5137 Other intervertebral disc degeneration, lumbosacral region: Secondary | ICD-10-CM | POA: Diagnosis not present

## 2013-11-17 DIAGNOSIS — M999 Biomechanical lesion, unspecified: Secondary | ICD-10-CM | POA: Diagnosis not present

## 2013-11-17 DIAGNOSIS — S335XXA Sprain of ligaments of lumbar spine, initial encounter: Secondary | ICD-10-CM | POA: Diagnosis not present

## 2013-11-19 DIAGNOSIS — M5137 Other intervertebral disc degeneration, lumbosacral region: Secondary | ICD-10-CM | POA: Diagnosis not present

## 2013-11-19 DIAGNOSIS — S239XXA Sprain of unspecified parts of thorax, initial encounter: Secondary | ICD-10-CM | POA: Diagnosis not present

## 2013-11-19 DIAGNOSIS — M999 Biomechanical lesion, unspecified: Secondary | ICD-10-CM | POA: Diagnosis not present

## 2013-11-19 DIAGNOSIS — S335XXA Sprain of ligaments of lumbar spine, initial encounter: Secondary | ICD-10-CM | POA: Diagnosis not present

## 2013-11-22 DIAGNOSIS — S335XXA Sprain of ligaments of lumbar spine, initial encounter: Secondary | ICD-10-CM | POA: Diagnosis not present

## 2013-11-22 DIAGNOSIS — S239XXA Sprain of unspecified parts of thorax, initial encounter: Secondary | ICD-10-CM | POA: Diagnosis not present

## 2013-11-22 DIAGNOSIS — M5137 Other intervertebral disc degeneration, lumbosacral region: Secondary | ICD-10-CM | POA: Diagnosis not present

## 2013-11-22 DIAGNOSIS — M999 Biomechanical lesion, unspecified: Secondary | ICD-10-CM | POA: Diagnosis not present

## 2013-11-24 DIAGNOSIS — S239XXA Sprain of unspecified parts of thorax, initial encounter: Secondary | ICD-10-CM | POA: Diagnosis not present

## 2013-11-24 DIAGNOSIS — M5137 Other intervertebral disc degeneration, lumbosacral region: Secondary | ICD-10-CM | POA: Diagnosis not present

## 2013-11-24 DIAGNOSIS — M999 Biomechanical lesion, unspecified: Secondary | ICD-10-CM | POA: Diagnosis not present

## 2013-11-24 DIAGNOSIS — S335XXA Sprain of ligaments of lumbar spine, initial encounter: Secondary | ICD-10-CM | POA: Diagnosis not present

## 2013-11-26 DIAGNOSIS — M999 Biomechanical lesion, unspecified: Secondary | ICD-10-CM | POA: Diagnosis not present

## 2013-11-26 DIAGNOSIS — S239XXA Sprain of unspecified parts of thorax, initial encounter: Secondary | ICD-10-CM | POA: Diagnosis not present

## 2013-11-26 DIAGNOSIS — M5137 Other intervertebral disc degeneration, lumbosacral region: Secondary | ICD-10-CM | POA: Diagnosis not present

## 2013-11-26 DIAGNOSIS — S335XXA Sprain of ligaments of lumbar spine, initial encounter: Secondary | ICD-10-CM | POA: Diagnosis not present

## 2013-11-29 DIAGNOSIS — M9981 Other biomechanical lesions of cervical region: Secondary | ICD-10-CM | POA: Diagnosis not present

## 2013-11-29 DIAGNOSIS — M5137 Other intervertebral disc degeneration, lumbosacral region: Secondary | ICD-10-CM | POA: Diagnosis not present

## 2013-11-29 DIAGNOSIS — S335XXA Sprain of ligaments of lumbar spine, initial encounter: Secondary | ICD-10-CM | POA: Diagnosis not present

## 2013-11-29 DIAGNOSIS — S239XXA Sprain of unspecified parts of thorax, initial encounter: Secondary | ICD-10-CM | POA: Diagnosis not present

## 2013-11-29 DIAGNOSIS — R51 Headache: Secondary | ICD-10-CM | POA: Diagnosis not present

## 2013-11-29 DIAGNOSIS — M999 Biomechanical lesion, unspecified: Secondary | ICD-10-CM | POA: Diagnosis not present

## 2013-12-01 ENCOUNTER — Other Ambulatory Visit: Payer: Self-pay | Admitting: Physical Medicine and Rehabilitation

## 2013-12-01 DIAGNOSIS — M9981 Other biomechanical lesions of cervical region: Secondary | ICD-10-CM | POA: Diagnosis not present

## 2013-12-01 DIAGNOSIS — S239XXA Sprain of unspecified parts of thorax, initial encounter: Secondary | ICD-10-CM | POA: Diagnosis not present

## 2013-12-01 DIAGNOSIS — M999 Biomechanical lesion, unspecified: Secondary | ICD-10-CM | POA: Diagnosis not present

## 2013-12-01 DIAGNOSIS — M5137 Other intervertebral disc degeneration, lumbosacral region: Secondary | ICD-10-CM | POA: Diagnosis not present

## 2013-12-01 DIAGNOSIS — M48061 Spinal stenosis, lumbar region without neurogenic claudication: Secondary | ICD-10-CM

## 2013-12-01 DIAGNOSIS — S335XXA Sprain of ligaments of lumbar spine, initial encounter: Secondary | ICD-10-CM | POA: Diagnosis not present

## 2013-12-01 DIAGNOSIS — R51 Headache: Secondary | ICD-10-CM | POA: Diagnosis not present

## 2013-12-02 ENCOUNTER — Other Ambulatory Visit: Payer: Self-pay | Admitting: Physical Medicine and Rehabilitation

## 2013-12-02 DIAGNOSIS — M48061 Spinal stenosis, lumbar region without neurogenic claudication: Secondary | ICD-10-CM

## 2013-12-07 DIAGNOSIS — H259 Unspecified age-related cataract: Secondary | ICD-10-CM | POA: Diagnosis not present

## 2013-12-07 DIAGNOSIS — H04129 Dry eye syndrome of unspecified lacrimal gland: Secondary | ICD-10-CM | POA: Diagnosis not present

## 2013-12-07 DIAGNOSIS — H4011X0 Primary open-angle glaucoma, stage unspecified: Secondary | ICD-10-CM | POA: Diagnosis not present

## 2013-12-07 DIAGNOSIS — Z961 Presence of intraocular lens: Secondary | ICD-10-CM | POA: Diagnosis not present

## 2013-12-08 DIAGNOSIS — R51 Headache: Secondary | ICD-10-CM | POA: Diagnosis not present

## 2013-12-08 DIAGNOSIS — S335XXA Sprain of ligaments of lumbar spine, initial encounter: Secondary | ICD-10-CM | POA: Diagnosis not present

## 2013-12-08 DIAGNOSIS — S239XXA Sprain of unspecified parts of thorax, initial encounter: Secondary | ICD-10-CM | POA: Diagnosis not present

## 2013-12-08 DIAGNOSIS — M999 Biomechanical lesion, unspecified: Secondary | ICD-10-CM | POA: Diagnosis not present

## 2013-12-08 DIAGNOSIS — M5137 Other intervertebral disc degeneration, lumbosacral region: Secondary | ICD-10-CM | POA: Diagnosis not present

## 2013-12-08 DIAGNOSIS — M9981 Other biomechanical lesions of cervical region: Secondary | ICD-10-CM | POA: Diagnosis not present

## 2013-12-09 ENCOUNTER — Ambulatory Visit
Admission: RE | Admit: 2013-12-09 | Discharge: 2013-12-09 | Disposition: A | Discharge status: Home / Self Care | Payer: Medicare Other | Source: Ambulatory Visit | Attending: Physical Medicine and Rehabilitation | Admitting: Physical Medicine and Rehabilitation

## 2013-12-09 DIAGNOSIS — M5126 Other intervertebral disc displacement, lumbar region: Secondary | ICD-10-CM | POA: Diagnosis not present

## 2013-12-09 DIAGNOSIS — M48061 Spinal stenosis, lumbar region without neurogenic claudication: Secondary | ICD-10-CM | POA: Diagnosis not present

## 2013-12-10 ENCOUNTER — Other Ambulatory Visit: Payer: Medicare Other

## 2013-12-10 DIAGNOSIS — M48061 Spinal stenosis, lumbar region without neurogenic claudication: Secondary | ICD-10-CM | POA: Diagnosis not present

## 2013-12-15 DIAGNOSIS — M9981 Other biomechanical lesions of cervical region: Secondary | ICD-10-CM | POA: Diagnosis not present

## 2013-12-15 DIAGNOSIS — S239XXA Sprain of unspecified parts of thorax, initial encounter: Secondary | ICD-10-CM | POA: Diagnosis not present

## 2013-12-15 DIAGNOSIS — M999 Biomechanical lesion, unspecified: Secondary | ICD-10-CM | POA: Diagnosis not present

## 2013-12-15 DIAGNOSIS — S335XXA Sprain of ligaments of lumbar spine, initial encounter: Secondary | ICD-10-CM | POA: Diagnosis not present

## 2013-12-15 DIAGNOSIS — M5137 Other intervertebral disc degeneration, lumbosacral region: Secondary | ICD-10-CM | POA: Diagnosis not present

## 2013-12-15 DIAGNOSIS — R51 Headache: Secondary | ICD-10-CM | POA: Diagnosis not present

## 2013-12-21 ENCOUNTER — Encounter: Payer: Self-pay | Admitting: Internal Medicine

## 2013-12-23 DIAGNOSIS — M9981 Other biomechanical lesions of cervical region: Secondary | ICD-10-CM | POA: Diagnosis not present

## 2013-12-23 DIAGNOSIS — M5137 Other intervertebral disc degeneration, lumbosacral region: Secondary | ICD-10-CM | POA: Diagnosis not present

## 2013-12-23 DIAGNOSIS — S335XXA Sprain of ligaments of lumbar spine, initial encounter: Secondary | ICD-10-CM | POA: Diagnosis not present

## 2013-12-23 DIAGNOSIS — S239XXA Sprain of unspecified parts of thorax, initial encounter: Secondary | ICD-10-CM | POA: Diagnosis not present

## 2013-12-23 DIAGNOSIS — M999 Biomechanical lesion, unspecified: Secondary | ICD-10-CM | POA: Diagnosis not present

## 2013-12-23 DIAGNOSIS — R51 Headache: Secondary | ICD-10-CM | POA: Diagnosis not present

## 2013-12-29 DIAGNOSIS — S335XXA Sprain of ligaments of lumbar spine, initial encounter: Secondary | ICD-10-CM | POA: Diagnosis not present

## 2013-12-29 DIAGNOSIS — M5137 Other intervertebral disc degeneration, lumbosacral region: Secondary | ICD-10-CM | POA: Diagnosis not present

## 2013-12-29 DIAGNOSIS — R51 Headache: Secondary | ICD-10-CM | POA: Diagnosis not present

## 2013-12-29 DIAGNOSIS — M999 Biomechanical lesion, unspecified: Secondary | ICD-10-CM | POA: Diagnosis not present

## 2013-12-29 DIAGNOSIS — M9981 Other biomechanical lesions of cervical region: Secondary | ICD-10-CM | POA: Diagnosis not present

## 2013-12-29 DIAGNOSIS — S239XXA Sprain of unspecified parts of thorax, initial encounter: Secondary | ICD-10-CM | POA: Diagnosis not present

## 2013-12-30 DIAGNOSIS — I495 Sick sinus syndrome: Secondary | ICD-10-CM | POA: Diagnosis not present

## 2013-12-30 DIAGNOSIS — R079 Chest pain, unspecified: Secondary | ICD-10-CM | POA: Diagnosis not present

## 2013-12-30 DIAGNOSIS — I10 Essential (primary) hypertension: Secondary | ICD-10-CM | POA: Diagnosis not present

## 2013-12-30 DIAGNOSIS — Z950 Presence of cardiac pacemaker: Secondary | ICD-10-CM | POA: Diagnosis not present

## 2014-01-10 DIAGNOSIS — I10 Essential (primary) hypertension: Secondary | ICD-10-CM | POA: Diagnosis not present

## 2014-01-10 DIAGNOSIS — I495 Sick sinus syndrome: Secondary | ICD-10-CM | POA: Diagnosis not present

## 2014-01-10 DIAGNOSIS — R079 Chest pain, unspecified: Secondary | ICD-10-CM | POA: Diagnosis not present

## 2014-01-10 DIAGNOSIS — R7309 Other abnormal glucose: Secondary | ICD-10-CM | POA: Diagnosis not present

## 2014-01-18 DIAGNOSIS — H2510 Age-related nuclear cataract, unspecified eye: Secondary | ICD-10-CM | POA: Diagnosis not present

## 2014-01-18 DIAGNOSIS — H4011X0 Primary open-angle glaucoma, stage unspecified: Secondary | ICD-10-CM | POA: Diagnosis not present

## 2014-01-18 DIAGNOSIS — Z961 Presence of intraocular lens: Secondary | ICD-10-CM | POA: Diagnosis not present

## 2014-01-18 DIAGNOSIS — H04129 Dry eye syndrome of unspecified lacrimal gland: Secondary | ICD-10-CM | POA: Diagnosis not present

## 2014-01-21 DIAGNOSIS — M6281 Muscle weakness (generalized): Secondary | ICD-10-CM | POA: Diagnosis not present

## 2014-01-21 DIAGNOSIS — M48061 Spinal stenosis, lumbar region without neurogenic claudication: Secondary | ICD-10-CM | POA: Diagnosis not present

## 2014-01-26 DIAGNOSIS — Z1231 Encounter for screening mammogram for malignant neoplasm of breast: Secondary | ICD-10-CM | POA: Diagnosis not present

## 2014-01-26 DIAGNOSIS — Z124 Encounter for screening for malignant neoplasm of cervix: Secondary | ICD-10-CM | POA: Diagnosis not present

## 2014-02-14 DIAGNOSIS — M4807 Spinal stenosis, lumbosacral region: Secondary | ICD-10-CM | POA: Diagnosis not present

## 2014-02-14 DIAGNOSIS — M6281 Muscle weakness (generalized): Secondary | ICD-10-CM | POA: Diagnosis not present

## 2014-02-23 DIAGNOSIS — Z79899 Other long term (current) drug therapy: Secondary | ICD-10-CM | POA: Diagnosis not present

## 2014-02-23 DIAGNOSIS — E559 Vitamin D deficiency, unspecified: Secondary | ICD-10-CM | POA: Diagnosis not present

## 2014-02-23 DIAGNOSIS — I10 Essential (primary) hypertension: Secondary | ICD-10-CM | POA: Diagnosis not present

## 2014-02-23 DIAGNOSIS — R7309 Other abnormal glucose: Secondary | ICD-10-CM | POA: Diagnosis not present

## 2014-02-23 DIAGNOSIS — F411 Generalized anxiety disorder: Secondary | ICD-10-CM | POA: Diagnosis not present

## 2014-02-23 DIAGNOSIS — Z0000 Encounter for general adult medical examination without abnormal findings: Secondary | ICD-10-CM | POA: Diagnosis not present

## 2014-03-01 DIAGNOSIS — Z961 Presence of intraocular lens: Secondary | ICD-10-CM | POA: Diagnosis not present

## 2014-03-01 DIAGNOSIS — H2511 Age-related nuclear cataract, right eye: Secondary | ICD-10-CM | POA: Diagnosis not present

## 2014-03-01 DIAGNOSIS — H4011X2 Primary open-angle glaucoma, moderate stage: Secondary | ICD-10-CM | POA: Diagnosis not present

## 2014-03-17 DIAGNOSIS — H4011X2 Primary open-angle glaucoma, moderate stage: Secondary | ICD-10-CM | POA: Diagnosis not present

## 2014-03-17 DIAGNOSIS — H2511 Age-related nuclear cataract, right eye: Secondary | ICD-10-CM | POA: Diagnosis not present

## 2014-03-17 DIAGNOSIS — Z961 Presence of intraocular lens: Secondary | ICD-10-CM | POA: Diagnosis not present

## 2014-03-23 ENCOUNTER — Encounter: Payer: Self-pay | Admitting: *Deleted

## 2014-03-31 DIAGNOSIS — M4807 Spinal stenosis, lumbosacral region: Secondary | ICD-10-CM | POA: Diagnosis not present

## 2014-03-31 DIAGNOSIS — M6281 Muscle weakness (generalized): Secondary | ICD-10-CM | POA: Diagnosis not present

## 2014-04-07 ENCOUNTER — Encounter (HOSPITAL_COMMUNITY): Payer: Self-pay | Admitting: Internal Medicine

## 2014-04-16 DIAGNOSIS — Z950 Presence of cardiac pacemaker: Secondary | ICD-10-CM | POA: Diagnosis not present

## 2014-04-19 DIAGNOSIS — Z961 Presence of intraocular lens: Secondary | ICD-10-CM | POA: Diagnosis not present

## 2014-04-19 DIAGNOSIS — H2511 Age-related nuclear cataract, right eye: Secondary | ICD-10-CM | POA: Diagnosis not present

## 2014-04-19 DIAGNOSIS — H4011X2 Primary open-angle glaucoma, moderate stage: Secondary | ICD-10-CM | POA: Diagnosis not present

## 2014-04-26 ENCOUNTER — Encounter: Payer: Self-pay | Admitting: *Deleted

## 2014-05-05 DIAGNOSIS — M4807 Spinal stenosis, lumbosacral region: Secondary | ICD-10-CM | POA: Diagnosis not present

## 2014-05-05 DIAGNOSIS — M5136 Other intervertebral disc degeneration, lumbar region: Secondary | ICD-10-CM | POA: Diagnosis not present

## 2014-05-17 DIAGNOSIS — H4011X2 Primary open-angle glaucoma, moderate stage: Secondary | ICD-10-CM | POA: Diagnosis not present

## 2014-05-17 DIAGNOSIS — H2511 Age-related nuclear cataract, right eye: Secondary | ICD-10-CM | POA: Diagnosis not present

## 2014-05-17 DIAGNOSIS — Z961 Presence of intraocular lens: Secondary | ICD-10-CM | POA: Diagnosis not present

## 2014-06-06 DIAGNOSIS — M4806 Spinal stenosis, lumbar region: Secondary | ICD-10-CM | POA: Diagnosis not present

## 2014-06-07 ENCOUNTER — Other Ambulatory Visit: Payer: Self-pay | Admitting: Neurological Surgery

## 2014-06-07 DIAGNOSIS — M48061 Spinal stenosis, lumbar region without neurogenic claudication: Secondary | ICD-10-CM

## 2014-06-07 DIAGNOSIS — Z961 Presence of intraocular lens: Secondary | ICD-10-CM | POA: Diagnosis not present

## 2014-06-07 DIAGNOSIS — H4011X2 Primary open-angle glaucoma, moderate stage: Secondary | ICD-10-CM | POA: Diagnosis not present

## 2014-06-07 DIAGNOSIS — H2511 Age-related nuclear cataract, right eye: Secondary | ICD-10-CM | POA: Diagnosis not present

## 2014-06-29 DIAGNOSIS — L0291 Cutaneous abscess, unspecified: Secondary | ICD-10-CM | POA: Diagnosis not present

## 2014-07-12 DIAGNOSIS — Z950 Presence of cardiac pacemaker: Secondary | ICD-10-CM | POA: Diagnosis not present

## 2014-07-28 ENCOUNTER — Encounter: Payer: Self-pay | Admitting: Internal Medicine

## 2014-07-28 ENCOUNTER — Ambulatory Visit (INDEPENDENT_AMBULATORY_CARE_PROVIDER_SITE_OTHER): Payer: Medicare Other | Admitting: Internal Medicine

## 2014-07-28 VITALS — BP 178/90 | HR 63 | Ht 64.0 in | Wt 143.4 lb

## 2014-07-28 DIAGNOSIS — Z79899 Other long term (current) drug therapy: Secondary | ICD-10-CM | POA: Diagnosis not present

## 2014-07-28 DIAGNOSIS — I442 Atrioventricular block, complete: Secondary | ICD-10-CM

## 2014-07-28 DIAGNOSIS — I10 Essential (primary) hypertension: Secondary | ICD-10-CM | POA: Diagnosis not present

## 2014-07-28 DIAGNOSIS — Z45018 Encounter for adjustment and management of other part of cardiac pacemaker: Secondary | ICD-10-CM

## 2014-07-28 LAB — MDC_IDC_ENUM_SESS_TYPE_INCLINIC
Battery Remaining Longevity: 129.6 mo
Battery Voltage: 2.95 V
Brady Statistic RA Percent Paced: 0.78 %
Brady Statistic RV Percent Paced: 0.83 %
Date Time Interrogation Session: 20160331112644
Implantable Pulse Generator Model: 2110
Implantable Pulse Generator Serial Number: 7423192
Lead Channel Impedance Value: 400 Ohm
Lead Channel Impedance Value: 637.5 Ohm
Lead Channel Pacing Threshold Amplitude: 0.5 V
Lead Channel Pacing Threshold Amplitude: 0.75 V
Lead Channel Pacing Threshold Pulse Width: 0.5 ms
Lead Channel Pacing Threshold Pulse Width: 0.5 ms
Lead Channel Sensing Intrinsic Amplitude: 12 mV
Lead Channel Sensing Intrinsic Amplitude: 3.2 mV
Lead Channel Setting Pacing Amplitude: 2 V
Lead Channel Setting Pacing Amplitude: 2.5 V
Lead Channel Setting Pacing Pulse Width: 0.5 ms
Lead Channel Setting Sensing Sensitivity: 2 mV

## 2014-07-28 LAB — BASIC METABOLIC PANEL
BUN: 12 mg/dL (ref 6–23)
CO2: 24 mEq/L (ref 19–32)
Calcium: 9.8 mg/dL (ref 8.4–10.5)
Chloride: 105 mEq/L (ref 96–112)
Creatinine, Ser: 0.64 mg/dL (ref 0.40–1.20)
GFR: 97.68 mL/min (ref >60.00)
Glucose, Bld: 93 mg/dL (ref 70–99)
Potassium: 4 mEq/L (ref 3.5–5.1)
Sodium: 137 mEq/L (ref 135–145)

## 2014-07-28 MED ORDER — CARVEDILOL 6.25 MG PO TABS: 6.2500 mg | ORAL_TABLET | Freq: Two times a day (BID) | ORAL | Status: DC

## 2014-07-28 NOTE — Patient Instructions (Addendum)
Your physician recommends that you continue on your current medications as directed. Please refer to the Current Medication list given to you today.  Lab today: BMET  Your physician recommends that you schedule a follow-up appointment in: 3 weeks with a PA/NP for follow up and blood pressure check  Your physician wants you to follow-up in: 1 year with Dr. Caryl Comes.  You will receive a reminder letter in the mail two months in advance. If you don't receive a letter, please call our office to schedule the follow-up appointment.

## 2014-07-28 NOTE — Progress Notes (Signed)
No care team member to display   HPI  Charlene Barker is a 69 y.o. female Seen in followup for pacemaker implanted for syncope left bundle branch block and recorder demonstrated intermittent complete heart block. This was done December 2013.  She has spells of intermittent dizziness. She has had no recurrent syncope.   These episods of dizziness have continued to occur. They're sometimes associated with position and other times not. They have   been ascribed to back issues ie spinal stenosis  Past Medical History  Diagnosis Date  . HTN (hypertension)   . Syncope   . Left bundle branch block   . Lightheadedness     Associated with exercise  . Atrioventricular block, complete -intermittent   . Pacemaker- [place] 12/13  . Oversensing on the atrial lead 08/27/2013    Past Surgical History  Procedure Laterality Date  . Bladder surgery    . Loop recorder implant N/A 09/20/2011    Procedure: LOOP RECORDER IMPLANT;  Surgeon: Deboraha Sprang, MD;  Location: [facility] CATH LAB;  Service: Cardiovascular;  Laterality: N/A;  . Permanent pacemaker insertion N/A 04/15/2012    Procedure: PERMANENT PACEMAKER INSERTION;  Surgeon: Deboraha Sprang, MD;  Location: [facility] CATH LAB;  Service: Cardiovascular;  Laterality: N/A;    Current Outpatient Prescriptions  Medication Sig Dispense Refill  . Ascorbic Acid (VITAMIN C) 1000 MG tablet Take 1,000 mg by mouth daily.    Marland Kitchen aspirin 81 MG tablet Take 81 mg by mouth daily.    . Cholecalciferol (VITAMIN D3) 2000 UNITS capsule Take 2,000 Units by mouth daily.    . dorzolamide (TRUSOPT) 2 % ophthalmic solution Place 1 drop into the left eye daily.     Marland Kitchen ibuprofen (ADVIL,MOTRIN) 200 MG tablet Take 200 mg by mouth every 6 (six) hours as needed. For pain    . ketorolac (ACULAR) 0.4 % SOLN Apply 1 drop to eye 2 (two) times daily. Place one drop in the left eye in the morning and one at night.    . latanoprost (XALATAN) 0.005 % ophthalmic solution Place 1  drop into both eyes at bedtime.     . meclizine (ANTIVERT) 25 MG tablet Take 25 mg by mouth 3 (three) times daily as needed. For dizziness/vertigo    . neomycin-polymyxin-dexamethasone (MAXITROL) 0.1 % ophthalmic suspension 1 drop. Takes 4 gtts in EARS prn    . Nepafenac (ILEVRO) 0.3 % SUSP Apply to eye daily.    . Nutritional Supplements (JUICE PLUS FIBRE PO) Take 1 tablet by mouth 2 (two) times daily.    Marland Kitchen spironolactone (ALDACTONE) 25 MG tablet Take 25 mg by mouth daily.    . timolol (BETIMOL) 0.5 % ophthalmic solution Place 1 drop into both eyes every morning.     . [DISCONTINUED] carvedilol (COREG) 6.25 MG tablet Take 1 tablet (6.25 mg total) by mouth 2 (two) times daily. 60 tablet 11   No current facility-administered medications for this visit.    Allergies  Allergen Reactions  . Penicillins   . Sulfa Drugs Cross Reactors   . Chlorhexidine Rash    Review of Systems negative except from HPI and PMH  Physical Exam BP 178/90 mmHg  Pulse 63  Ht 5\' 4"  (1.626 m)  Wt 143 lb 6.4 oz (65.046 kg)  BMI 24.60 kg/m2 Well developed and well nourished in no acute distress HENT normal E scleral and icterus clear Neck Supple JVP flat; carotids brisk and  full Clear to ausculation regular rate and rhythm, no murmurs gallops or rub Soft with active bowel sounds No clubbing cyanosis  Edema Alert and oriented, grossly normal motor and sensory function Skin Warm and Dry  ECG demonstrates sinus rhythm with left bundle branch block and left axis deviation  Assessment and  Plan  Intermittent complete heart block  Atrial lead oversensing  Syncope now with presyncope  Pacemaker-St. Jude  The patient's device was interrogated.  The information was reviewed. No changes were made in the programming.    Hypertension  Hyperkalemia with aldactone  Weakness with standing  Her blood pressure remains elevated. We'll check a metabolic profile today as she's had problems with Aldactone with  hyperkalemia;  If K is ok  iwll begin losartan for BP  There is surpisingly no evidence of orthostatic hypotension, rather persistent hypertension      We spent more than 50% of our >25 min visit in face to face counseling regarding the above

## 2014-08-01 DIAGNOSIS — M5136 Other intervertebral disc degeneration, lumbar region: Secondary | ICD-10-CM | POA: Diagnosis not present

## 2014-08-01 DIAGNOSIS — M4807 Spinal stenosis, lumbosacral region: Secondary | ICD-10-CM | POA: Diagnosis not present

## 2014-08-04 ENCOUNTER — Telehealth: Payer: Self-pay | Admitting: Internal Medicine

## 2014-08-04 DIAGNOSIS — Z79899 Other long term (current) drug therapy: Secondary | ICD-10-CM

## 2014-08-04 NOTE — Telephone Encounter (Signed)
New Message    Patient would like to speak to the nurse in regards to medication. They were going to prescribe her some medication but she could not take it and needs to talk about the medication.  Please give patient a call.

## 2014-08-04 NOTE — Telephone Encounter (Signed)
When patient calls back -- inform her:  Start Losartan 50 mg daily  BMET in 2 weeks

## 2014-08-09 MED ORDER — LOSARTAN POTASSIUM 50 MG PO TABS: 50.0000 mg | ORAL_TABLET | Freq: Every day | ORAL | Status: DC

## 2014-08-09 NOTE — Telephone Encounter (Signed)
Advised patient of Dr. Olin Pia recommendations.   Patient states that she took this several years ago.  She voices that when she was on this she did fine, but this depended on "pill maker" (company).  She states that she could only take a certain one.  She is going to contact old pharmacy to see if she can find out which one. She will call back with information.

## 2014-08-09 NOTE — Telephone Encounter (Signed)
Patient tells me she spoke with pharmacy who explained that manufacturers change all the time and they don't have records to help with this. She tells me to order it like we normally do and she will see how she responds. Rx sent to [facility]. Patient will come by office 4/27 for follow up lab work. Patient verbalized understanding and agreeable to plan.

## 2014-08-15 ENCOUNTER — Encounter: Payer: Self-pay | Admitting: Nurse Practitioner

## 2014-08-15 ENCOUNTER — Ambulatory Visit (INDEPENDENT_AMBULATORY_CARE_PROVIDER_SITE_OTHER): Payer: Medicare Other | Admitting: Nurse Practitioner

## 2014-08-15 ENCOUNTER — Other Ambulatory Visit: Payer: Self-pay | Admitting: Internal Medicine

## 2014-08-15 VITALS — BP 110/68 | HR 74 | Ht 64.5 in | Wt 141.6 lb

## 2014-08-15 DIAGNOSIS — I442 Atrioventricular block, complete: Secondary | ICD-10-CM | POA: Diagnosis not present

## 2014-08-15 DIAGNOSIS — I10 Essential (primary) hypertension: Secondary | ICD-10-CM | POA: Diagnosis not present

## 2014-08-15 LAB — BASIC METABOLIC PANEL
BUN: 10 mg/dL (ref 6–23)
CO2: 29 mEq/L (ref 19–32)
Calcium: 10 mg/dL (ref 8.4–10.5)
Chloride: 102 mEq/L (ref 96–112)
Creatinine, Ser: 0.72 mg/dL (ref 0.40–1.20)
GFR: 85.26 mL/min (ref >60.00)
Glucose, Bld: 106 mg/dL — ABNORMAL HIGH (ref 70–99)
Potassium: 4.1 mEq/L (ref 3.5–5.1)
Sodium: 136 mEq/L (ref 135–145)

## 2014-08-15 NOTE — Addendum Note (Signed)
Addended by: Eulis Foster on: 08/15/2014 12:30 PM   Modules accepted: Orders

## 2014-08-15 NOTE — Patient Instructions (Addendum)
**Note De-Identified Charlene Barker Obfuscation** Medication Instructions:  Continue same  Labwork: Today (BMET)  Testing/Procedures: Remote monitoring is used to monitor your Pacemaker of ICD from home. This monitoring reduces the number of office visits required to check your device to one time per year. It allows [place] to keep an eye on the functioning of your device to ensure it is working properly. You are scheduled for a device check from home on November 14, 2014. You may send your transmission at any time that day. If you have a wireless device, the transmission will be sent automatically. After your physician reviews your transmission, you will receive a postcard with your next transmission date.   Follow-Up: Your physician wants you to follow-up in: 1 year. You will receive a reminder letter in the mail two months in advance. If you don't receive a letter, please call our office to schedule the follow-up appointment.   You will need a nurse visit so we can check your BP cuff against ours.

## 2014-08-15 NOTE — Progress Notes (Signed)
Electrophysiology Office Note Date: 08/15/2014  ID:  Charlene Barker, DOB 10-09-1945, MRN [id number]  PCP: No primary care provider on file.  Electrophysiologist: Caryl Comes  CC: Hypertension follow-up  Charlene Barker is a 69 y.o. female is seen today for Dr Caryl Comes.  She presents today for follow up of hypertension.  Since last being seen in our clinic, the patient reports doing very well.  She denies chest pain, palpitations, dyspnea, PND, orthopnea, nausea, vomiting, syncope.  At her last visit in March with Dr Caryl Comes, Losartan was added for blood pressure control.  She has a history of hyperkalemia in the setting of Aldactone use.  She has a long standing history of orthostatic intolerance. Her dizziness occurs mostly in the shower.  She has not had frank syncope.   She has asked for recommendations for PCP.   Device History: STJ dual chamber PPM implanted 2013 for intermittent complete heart block  Past Medical History  Diagnosis Date  . HTN (hypertension)   . Syncope   . Left bundle branch block   . Lightheadedness     Associated with exercise  . Atrioventricular block, complete -intermittent     a. s/p STJ pacemaker  . 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 Drug-induced hyperkalemia -associated with Aldactone    Past Surgical History  Procedure Laterality Date  . Bladder surgery    . Loop recorder implant N/A 09/20/2011    Procedure: LOOP RECORDER IMPLANT;  Surgeon: Deboraha Sprang, MD;  Location: [facility] LAB;  Service: Cardiovascular;  Laterality: N/A;  . Permanent pacemaker insertion N/A 04/15/2012    STJ dual chamber pacemaker implanted by Dr Caryl Comes    Current Outpatient Prescriptions  Medication Sig Dispense Refill  . Ascorbic Acid (VITAMIN C) 1000 MG tablet Take 1,000 mg by mouth daily.    Marland Kitchen aspirin 81 MG tablet Take 81 mg by mouth daily.    . Cholecalciferol (VITAMIN D3) 2000 UNITS capsule Take 2,000 Units by mouth daily.    . dorzolamide (TRUSOPT) 2 %  ophthalmic solution Place 1 drop into the left eye 2 (two) times daily.    Marland Kitchen ibuprofen (ADVIL,MOTRIN) 200 MG tablet Take 200 mg by mouth every 6 (six) hours as needed. For pain    . latanoprost (XALATAN) 0.005 % ophthalmic solution Place 1 drop into both eyes at bedtime.     Marland Kitchen losartan (COZAAR) 50 MG tablet Take 1 tablet (50 mg total) by mouth daily. 30 tablet 2  . meclizine (ANTIVERT) 25 MG tablet Take 25 mg by mouth 3 (three) times daily as needed. For dizziness/vertigo    . naproxen sodium (ANAPROX) 220 MG tablet Take 220 mg by mouth as needed (for back pain).    Marland Kitchen neomycin-polymyxin-dexamethasone (MAXITROL) 0.1 % ophthalmic suspension 1 drop. Takes 4 gtts in EARS prn    . Nepafenac (ILEVRO) 0.3 % SUSP Apply to eye daily.    . Nutritional Supplements (JUICE PLUS FIBRE PO) Take 1 tablet by mouth 2 (two) times daily.    . Pumpkin Seed-Soy Germ (AZO BLADDER CONTROL/GO-LESS) CAPS Take 1 capsule by mouth as needed (incontience).    . timolol (BETIMOL) 0.5 % ophthalmic solution Place 1 drop into both eyes every morning.      No current facility-administered medications for this visit.    Allergies:   Penicillins; Sulfa drugs cross reactors; and Chlorhexidine   Social History: History   Social History  . Marital Status: Married    Spouse Name: N/A  .  Number of Children: N/A  . Years of Education: N/A   Occupational History  . Not on file.   Social History Main Topics  . Smoking status: Never Smoker   . Smokeless tobacco: Not on file  . Alcohol Use: Not on file  . Drug Use: No  . Sexual Activity: Not on file   Other Topics Concern  . Not on file   Social History Narrative    Family History: Family History  Problem Relation Age of Onset  . Leukemia Mother   . Stroke Father   . Heart failure Father   . Heart attack Father   . Heart disease Father      Review of Systems: All other systems reviewed and are otherwise negative except as noted above.   Physical Exam: VS:   BP 110/68 mmHg  Pulse 74  Ht 5' 4.5" (1.638 m)  Wt 141 lb 9.6 oz (64.229 kg)  BMI 23.94 kg/m2  SpO2 98% , BMI Body mass index is 23.94 kg/(m^2).  GEN- The patient is well appearing, alert and oriented x 3 today.   HEENT: normocephalic, atraumatic; sclera clear, conjunctiva pink; hearing intact; oropharynx clear; neck supple  Lungs- Clear to ausculation bilaterally, normal work of breathing.  No wheezes, rales, rhonchi Heart- Regular rate and rhythm, no murmurs, rubs or gallops  GI- soft, non-tender, non-distended, bowel sounds present  Extremities- no clubbing, cyanosis, or edema  MS- no significant deformity or atrophy Skin- warm and dry, no rash or lesion; PPM pocket well healed Psych- euthymic mood, full affect Neuro- strength and sensation are intact  PPM Interrogation- reviewed in detail today,  See PACEART report  EKG:  EKG is not ordered today.  Recent Labs: 07/28/2014: BUN 12; Creatinine 0.64; Potassium 4.0; Sodium 137   Wt Readings from Last 3 Encounters:  08/15/14 141 lb 9.6 oz (64.229 kg)  07/28/14 143 lb 6.4 oz (65.046 kg)  08/27/13 142 lb (64.411 kg)     Assessment and Plan:  1.  HTN She has not been checking home blood pressures, but blood pressure much improved in office today.  Continue Losartan BMET today  2.  Intermittent complete heart block PPM interrogated today Pt has noise on atrial lead that is not reproducible with isometrics Unipolar sensing with noise, bipolar sensing with P waves of 5; sensing changed to bipolar today Will follow via [facility]   Current medicines are reviewed at length with the patient today.   The patient does not have concerns regarding her medicines.  The following changes were made today:  none  Labs/ tests ordered today include:   Orders Placed This Encounter  Procedures  . Basic Metabolic Panel (BMET)    Disposition:   [facility]/Follow up with Dr Caryl Comes 1 year as scheduled   Signed, Chanetta Marshall,  NP 08/15/2014 11:49 AM  [place] [address] [place] [address] [phone] (office) [phone] (fax)

## 2014-08-15 NOTE — Addendum Note (Signed)
Addended by: Eulis Foster on: 08/15/2014 12:29 PM   Modules accepted: Orders

## 2014-08-16 ENCOUNTER — Encounter: Payer: Self-pay | Admitting: Internal Medicine

## 2014-08-16 DIAGNOSIS — Z961 Presence of intraocular lens: Secondary | ICD-10-CM | POA: Diagnosis not present

## 2014-08-16 DIAGNOSIS — H35352 Cystoid macular degeneration, left eye: Secondary | ICD-10-CM | POA: Diagnosis not present

## 2014-08-16 DIAGNOSIS — H4011X2 Primary open-angle glaucoma, moderate stage: Secondary | ICD-10-CM | POA: Diagnosis not present

## 2014-08-16 DIAGNOSIS — H2511 Age-related nuclear cataract, right eye: Secondary | ICD-10-CM | POA: Diagnosis not present

## 2014-08-22 LAB — MDC_IDC_ENUM_SESS_TYPE_INCLINIC
Implantable Pulse Generator Model: 2110
Implantable Pulse Generator Serial Number: 7423192
Lead Channel Setting Pacing Amplitude: 2 V
Lead Channel Setting Pacing Amplitude: 2.5 V
Lead Channel Setting Pacing Pulse Width: 0.5 ms
Lead Channel Setting Sensing Sensitivity: 2 mV

## 2014-08-24 ENCOUNTER — Other Ambulatory Visit (INDEPENDENT_AMBULATORY_CARE_PROVIDER_SITE_OTHER): Payer: Medicare Other | Admitting: *Deleted

## 2014-08-24 DIAGNOSIS — Z79899 Other long term (current) drug therapy: Secondary | ICD-10-CM | POA: Diagnosis not present

## 2014-08-24 LAB — BASIC METABOLIC PANEL
BUN: 9 mg/dL (ref 6–23)
CO2: 28 mEq/L (ref 19–32)
Calcium: 9.8 mg/dL (ref 8.4–10.5)
Chloride: 105 mEq/L (ref 96–112)
Creatinine, Ser: 0.64 mg/dL (ref 0.40–1.20)
GFR: 97.66 mL/min (ref >60.00)
Glucose, Bld: 96 mg/dL (ref 70–99)
Potassium: 4 mEq/L (ref 3.5–5.1)
Sodium: 138 mEq/L (ref 135–145)

## 2014-08-24 NOTE — Addendum Note (Signed)
Addended by: Eulis Foster on: 08/24/2014 02:58 PM   Modules accepted: Orders

## 2014-09-13 ENCOUNTER — Encounter: Payer: Self-pay | Admitting: Internal Medicine

## 2014-09-16 DIAGNOSIS — J302 Other seasonal allergic rhinitis: Secondary | ICD-10-CM | POA: Diagnosis not present

## 2014-09-16 DIAGNOSIS — R030 Elevated blood-pressure reading, without diagnosis of hypertension: Secondary | ICD-10-CM | POA: Diagnosis not present

## 2014-09-20 ENCOUNTER — Encounter: Payer: Self-pay | Admitting: Internal Medicine

## 2014-09-28 ENCOUNTER — Telehealth: Payer: Self-pay | Admitting: Internal Medicine

## 2014-09-28 NOTE — Telephone Encounter (Signed)
Pt c/o medication issue:  1. Name of Medication: Losartain 50 Mg    3. Are you having a reaction (difficulty breathing--STAT)? Experiencing coughing and wheezing. Please call back to discuss

## 2014-09-28 NOTE — Telephone Encounter (Signed)
Will forward to [facility]

## 2014-09-28 NOTE — Telephone Encounter (Signed)
Patient tells me that she is experiencing coughing and SOB.  Reports sinus infections for past 3 weeks.  She was on abx (Clindamycin) but stopped secondary to hallucinogenic side effects.  She is planning on seeing an ENT next week for different medication/treatment.  Informed patient that symptom complaints are most likely related to sinus infection as opposed to medication. Patient instructed to address sinus infection first, and call back if symptoms persist after infection has been taken care of and treated. Patient verbalized understanding and agreeable to plan.

## 2014-10-03 DIAGNOSIS — J310 Chronic rhinitis: Secondary | ICD-10-CM | POA: Diagnosis not present

## 2014-10-03 DIAGNOSIS — J0101 Acute recurrent maxillary sinusitis: Secondary | ICD-10-CM | POA: Diagnosis not present

## 2014-10-03 DIAGNOSIS — J343 Hypertrophy of nasal turbinates: Secondary | ICD-10-CM | POA: Diagnosis not present

## 2014-11-01 DIAGNOSIS — Z961 Presence of intraocular lens: Secondary | ICD-10-CM | POA: Diagnosis not present

## 2014-11-01 DIAGNOSIS — H4011X2 Primary open-angle glaucoma, moderate stage: Secondary | ICD-10-CM | POA: Diagnosis not present

## 2014-11-01 DIAGNOSIS — H2511 Age-related nuclear cataract, right eye: Secondary | ICD-10-CM | POA: Diagnosis not present

## 2014-11-03 DIAGNOSIS — M5136 Other intervertebral disc degeneration, lumbar region: Secondary | ICD-10-CM | POA: Diagnosis not present

## 2014-11-03 DIAGNOSIS — M4807 Spinal stenosis, lumbosacral region: Secondary | ICD-10-CM | POA: Diagnosis not present

## 2014-11-04 ENCOUNTER — Other Ambulatory Visit: Payer: Self-pay | Admitting: Internal Medicine

## 2014-11-08 ENCOUNTER — Telehealth: Payer: Self-pay

## 2014-11-08 NOTE — Telephone Encounter (Signed)
Patient called in for refill of Losartan 50 mg.  I called it in to [facility]  on [address] since she needs it right away.

## 2014-11-14 ENCOUNTER — Encounter: Payer: Medicare Other | Admitting: *Deleted

## 2014-11-14 ENCOUNTER — Telehealth: Payer: Self-pay | Admitting: *Deleted

## 2014-11-14 ENCOUNTER — Telehealth: Payer: Self-pay | Admitting: Cardiology

## 2014-11-14 NOTE — Telephone Encounter (Signed)
Walk-in. Patient came to office to have BP check.  States she took Losartan yesterday at 3pm then again this morning at 6am - she is worried because she took them so close together.   States she did call pharmacist who told her to monitor her BP and call her doctor if she develops dizziness/syncope.  She instead comes to office for [place] to check her BP.   BP was 132/78, HR 79. Instructed to monitor her BP at home like instructed by pharmacist. Call if she develops low BP or dizziness/weakness/fatigue. Patient verbalized understanding and agreeable to plan.

## 2014-11-14 NOTE — Telephone Encounter (Signed)
LMOVM reminding pt to send remote transmission.   

## 2014-11-15 ENCOUNTER — Encounter: Payer: Self-pay | Admitting: Cardiology

## 2014-11-21 DIAGNOSIS — Z950 Presence of cardiac pacemaker: Secondary | ICD-10-CM | POA: Diagnosis not present

## 2014-11-24 ENCOUNTER — Telehealth: Payer: Self-pay | Admitting: *Deleted

## 2014-11-24 NOTE — Telephone Encounter (Signed)
LMOM regarding [facility] transfer request.

## 2014-11-24 NOTE — Telephone Encounter (Signed)
Spoke with patient and relayed that her [facility] transmissions have now been transferred to this clinic.  Patient asked if transmission on 11/21/14 was received, confirmed that it was successfully transmitted through [facility].  Patient asked if anything on the remote "has to do with my vertigo episode that night".  Clarified that patient has been seeing an ENT for vertigo and patient states she spoke with ENT office today and "they took care of it".  Scheduled patient for a new remote date on 02/23/15.  Patient aware to call office with worsening symptoms, questions, or concerns.  Patient voices understanding of all instructions and denies any additional questions or concerns at this time.
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
## 2014-11-30 DIAGNOSIS — R42 Dizziness and giddiness: Secondary | ICD-10-CM | POA: Diagnosis not present

## 2014-11-30 DIAGNOSIS — H903 Sensorineural hearing loss, bilateral: Secondary | ICD-10-CM | POA: Diagnosis not present

## 2014-12-18 ENCOUNTER — Emergency Department (HOSPITAL_COMMUNITY)
Admission: EM | Admit: 2014-12-18 | Discharge: 2014-12-18 | Disposition: A | Discharge status: Home / Self Care | Payer: No Typology Code available for payment source | Attending: Emergency Medicine | Admitting: Emergency Medicine

## 2014-12-18 ENCOUNTER — Emergency Department (HOSPITAL_COMMUNITY): Payer: No Typology Code available for payment source

## 2014-12-18 ENCOUNTER — Encounter (HOSPITAL_COMMUNITY): Payer: Self-pay | Admitting: *Deleted

## 2014-12-18 DIAGNOSIS — I10 Essential (primary) hypertension: Secondary | ICD-10-CM | POA: Insufficient documentation

## 2014-12-18 DIAGNOSIS — Y998 Other external cause status: Secondary | ICD-10-CM | POA: Diagnosis not present

## 2014-12-18 DIAGNOSIS — S199XXA Unspecified injury of neck, initial encounter: Secondary | ICD-10-CM | POA: Diagnosis present

## 2014-12-18 DIAGNOSIS — Z7982 Long term (current) use of aspirin: Secondary | ICD-10-CM | POA: Insufficient documentation

## 2014-12-18 DIAGNOSIS — Z880 Allergy status to penicillin: Secondary | ICD-10-CM | POA: Insufficient documentation

## 2014-12-18 DIAGNOSIS — T148 Other injury of unspecified body region: Secondary | ICD-10-CM | POA: Diagnosis not present

## 2014-12-18 DIAGNOSIS — Y92410 Unspecified street and highway as the place of occurrence of the external cause: Secondary | ICD-10-CM | POA: Insufficient documentation

## 2014-12-18 DIAGNOSIS — M25531 Pain in right wrist: Secondary | ICD-10-CM | POA: Diagnosis not present

## 2014-12-18 DIAGNOSIS — S63501A Unspecified sprain of right wrist, initial encounter: Secondary | ICD-10-CM | POA: Diagnosis not present

## 2014-12-18 DIAGNOSIS — Y9389 Activity, other specified: Secondary | ICD-10-CM | POA: Diagnosis not present

## 2014-12-18 DIAGNOSIS — S161XXA Strain of muscle, fascia and tendon at neck level, initial encounter: Secondary | ICD-10-CM

## 2014-12-18 DIAGNOSIS — Z950 Presence of cardiac pacemaker: Secondary | ICD-10-CM | POA: Insufficient documentation

## 2014-12-18 DIAGNOSIS — S39012A Strain of muscle, fascia and tendon of lower back, initial encounter: Secondary | ICD-10-CM

## 2014-12-18 DIAGNOSIS — S6991XA Unspecified injury of right wrist, hand and finger(s), initial encounter: Secondary | ICD-10-CM | POA: Diagnosis not present

## 2014-12-18 DIAGNOSIS — Z79899 Other long term (current) drug therapy: Secondary | ICD-10-CM | POA: Insufficient documentation
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
## 2014-12-18 NOTE — ED Notes (Signed)
Pt stable, ambulatory, states understanding of discharge instructions 
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
## 2014-12-21 ENCOUNTER — Telehealth: Payer: Self-pay | Admitting: Internal Medicine
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
## 2014-12-21 NOTE — Telephone Encounter (Signed)
LMOM requesting call back to the [facility]. 

## 2014-12-21 NOTE — Telephone Encounter (Signed)
Follow up ° ° ° ° ° °Returning a call to Emily °
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
## 2014-12-27 ENCOUNTER — Ambulatory Visit (INDEPENDENT_AMBULATORY_CARE_PROVIDER_SITE_OTHER): Payer: Medicare Other | Admitting: Diagnostic Neuroimaging

## 2014-12-27 ENCOUNTER — Encounter: Payer: Self-pay | Admitting: Diagnostic Neuroimaging

## 2014-12-27 VITALS — BP 138/71 | HR 64 | Ht 64.0 in | Wt 143.6 lb

## 2014-12-27 DIAGNOSIS — G458 Other transient cerebral ischemic attacks and related syndromes: Secondary | ICD-10-CM | POA: Insufficient documentation

## 2014-12-27 DIAGNOSIS — Z79899 Other long term (current) drug therapy: Secondary | ICD-10-CM

## 2014-12-27 NOTE — Patient Instructions (Addendum)
Start aspirin 81mg  daily.  I will check CT angiogram head/neck.  I will check echocardiogram.

## 2014-12-27 NOTE — Progress Notes (Signed)
[facility]  PATIENT: Charlene Barker DOB: 07/25/1945  REFERRING CLINICIAN: Benjamine Mola HISTORY FROM: patient  REASON FOR VISIT: new consult    HISTORICAL  CHIEF COMPLAINT:  Chief Complaint  Patient presents with  . "Tingling, then numbness"    rm 7, New Patient    HISTORY OF PRESENT ILLNESS:   69 year old female with hypertension, glaucoma, recurrent syncope status post pacemaker, here for evaluation of left-sided numbness. July 2016 patient had 30 minute episode of left face, left arm, left leg numbness and tingling. Patient did not seek medical attention. Patient also had some dizziness and vertigo symptoms. Patient went to PCP and ENT for evaluation. ENT recognize possible TIA symptoms and referred patient to me for further evaluation.  Patient did have some intermittent numbness and tingling throughout her body, typically triggered by anxiety and nervousness.  Patient denies focal weakness, slurred speech, trouble swallowing, or other stroke symptoms.   REVIEW OF SYSTEMS: Full 14 system review of systems performed and notable only for numbness spinal stenosis and low back pacemaker aching muscles incontinence being sensation fatigue blurred vision.  ALLERGIES: Allergies  Allergen Reactions  . Penicillins   . Sulfa Drugs Cross Reactors   . Chlorhexidine Rash    HOME MEDICATIONS: Outpatient Prescriptions Prior to Visit  Medication Sig Dispense Refill  . aspirin 81 MG tablet Take 81 mg by mouth daily.    . dorzolamide (TRUSOPT) 2 % ophthalmic solution Place 1 drop into the left eye 2 (two) times daily.    Marland Kitchen ibuprofen (ADVIL,MOTRIN) 200 MG tablet Take 200 mg by mouth every 6 (six) hours as needed. For pain    . latanoprost (XALATAN) 0.005 % ophthalmic solution Place 1 drop into both eyes at bedtime.     Marland Kitchen losartan (COZAAR) 50 MG tablet TAKE ONE TABLET BY MOUTH ONCE DAILY 90 tablet 3  . meclizine (ANTIVERT) 25 MG tablet Take 25 mg by mouth 3 (three) times  daily as needed. For dizziness/vertigo    . naproxen sodium (ANAPROX) 220 MG tablet Take 220 mg by mouth as needed (for back pain).    . Nutritional Supplements (JUICE PLUS FIBRE PO) Take 1 tablet by mouth 2 (two) times daily.    . Pumpkin Seed-Soy Germ (AZO BLADDER CONTROL/GO-LESS) CAPS Take 1 capsule by mouth as needed (incontience).    . timolol (BETIMOL) 0.5 % ophthalmic solution Place 1 drop into both eyes every morning.     . Ascorbic Acid (VITAMIN C) 1000 MG tablet Take 1,000 mg by mouth daily.    . Cholecalciferol (VITAMIN D3) 2000 UNITS capsule Take 2,000 Units by mouth daily.    Marland Kitchen neomycin-polymyxin-dexamethasone (MAXITROL) 0.1 % ophthalmic suspension 1 drop. Takes 4 gtts in EARS prn    . Nepafenac (ILEVRO) 0.3 % SUSP Apply to eye daily.     No facility-administered medications prior to visit.    PAST MEDICAL HISTORY: Past Medical History  Diagnosis Date  . HTN (hypertension)   . Syncope   . Left bundle branch block   . Lightheadedness     Associated with exercise  . Atrioventricular block, complete -intermittent     a. s/p STJ pacemaker  . 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 Drug-induced hyperkalemia -associated with Aldactone   . Pacemaker   . Glaucoma     bilateral    PAST SURGICAL HISTORY: Past Surgical History  Procedure Laterality Date  . Bladder surgery      sling  . Loop recorder implant N/A 09/20/2011  Procedure: LOOP RECORDER IMPLANT;  Surgeon: Deboraha Sprang, MD;  Location: [facility] CATH LAB;  Service: Cardiovascular;  Laterality: N/A;  . Permanent pacemaker insertion N/A 04/15/2012  . Eye surgery      several, bilateral    FAMILY HISTORY: Family History  Problem Relation Age of Onset  . Leukemia Mother   . Stroke Father   . Heart failure Father   . Heart attack Father   . Heart disease Father     SOCIAL HISTORY:  Social History   Social History  . Marital Status: Married    Spouse Name: Jori Moll  . Number of Children: 1  . Years of  Education: 13   Occupational History  . retired    Social History Main Topics  . Smoking status: Never Smoker   . Smokeless tobacco: Not on file  . Alcohol Use: No  . Drug Use: No  . Sexual Activity: Not on file   Other Topics Concern  . Not on file   Social History Narrative   Lives at home with husband   caffeine use - chocolate mainly     PHYSICAL EXAM  GENERAL EXAM/CONSTITUTIONAL: Vitals:  Filed Vitals:   12/27/14 1158  BP: 138/71  Pulse: 64  Height: 5\' 4"  (1.626 m)  Weight: 143 lb 9.6 oz (65.137 kg)     Body mass index is 24.64 kg/(m^2).  Visual Acuity Screening   Right eye Left eye Both eyes  Without correction:     With correction: 20/50 20/70      Patient is in no distress; well developed, nourished and groomed; neck is supple  CARDIOVASCULAR:  Examination of carotid arteries is normal; no carotid bruits  Regular rate and rhythm, no murmurs  Examination of peripheral vascular system by observation and palpation is normal  EYES:  Ophthalmoscopic exam of optic discs and posterior segments is normal; no papilledema or hemorrhages  MUSCULOSKELETAL:  Gait, strength, tone, movements noted in Neurologic exam below  NEUROLOGIC: MENTAL STATUS:  No flowsheet data found.  awake, alert, oriented to person, place and time  recent and remote memory intact  normal attention and concentration  language fluent, comprehension intact, naming intact,   fund of knowledge appropriate  CRANIAL NERVE:   2nd - no papilledema on fundoscopic exam  2nd, 3rd, 4th, 6th - pupils equal and reactive to light, visual fields full to confrontation, extraocular muscles --> DECR ABDUCTION OF LEFT EYE ON LEFT GAZE WITH SUBJECTIVE BLURRED VISION, no nystagmus  5th - facial sensation symmetric  7th - facial strength symmetric  8th - hearing intact  9th - palate elevates symmetrically, uvula midline  11th - shoulder shrug symmetric  12th - tongue protrusion  midline  MOTOR:   normal bulk and tone, full strength in the BUE, BLE  SENSORY:   normal and symmetric to light touch, pinprick, temperature, vibration   COORDINATION:   finger-nose-finger, fine finger movements normal  REFLEXES:   deep tendon reflexes TRACE and symmetric  GAIT/STATION:   narrow based gait; able to walk tandem; romberg is negative    DIAGNOSTIC DATA (LABS, IMAGING, TESTING) - I reviewed patient records, labs, notes, testing and imaging myself where available.  Lab Results  Component Value Date   WBC 10.2 04/16/2012   HGB 13.8 04/16/2012   HCT 39.8 04/16/2012   MCV 89.2 04/16/2012   PLT 213 04/16/2012      Component Value Date/Time   NA 138 08/24/2014 1459   K 4.0 08/24/2014 1459  CL 105 08/24/2014 1459   CO2 28 08/24/2014 1459   GLUCOSE 96 08/24/2014 1459   BUN 9 08/24/2014 1459   CREATININE 0.64 08/24/2014 1459   CALCIUM 9.8 08/24/2014 1459   GFRNONAA >90 04/16/2012 0555   GFRAA >90 04/16/2012 0555   No results found for: CHOL, HDL, LDLCALC, LDLDIRECT, TRIG, CHOLHDL No results found for: HGBA1C No results found for: VITAMINB12 Lab Results  Component Value Date   TSH 1.889 04/14/2012    12/09/13 CT lumbar [I reviewed images myself and agree with interpretation. -VRP]  1. Severe spinal stenosis at L4-5 due to a broad-based disc protrusion as well as hypertrophy of the ligamentum flavum.  2. Soft disc protrusion into the left neural foramen at L4-5 compresses the left L4 nerve. In addition, the left lateral recess is severely compressed at L4-5 and a right lateral recess is also impinged. 3. Slight spinal stenosis at L3-4 due to a broad-based disc bulge.    ASSESSMENT AND PLAN  69 y.o. year old female here with 30 minute episode of left face, left arm, left leg numbness and tingling, concerning for possible transient ischemic attack. Will pursue further workup. Patient has pacemaker and therefore we cannot get MRI of the brain. I will  check CT angiogram of the head and neck, echocardiogram and check for other vascular risk factors. Unfortunately patient does not have PCP. Encouraged her to establish with new PCP. For now she follows up with cardiology and ophthalmology.  Dx:  Other specified transient cerebral ischemias - Plan: CT Angio Head W/Cm &/Or Wo Cm, CT Angio Neck W/Cm &/Or Wo/Cm, ECHOCARDIOGRAM COMPLETE, Hemoglobin A1c, Lipid Panel  Long-term use of high-risk medication - Plan: Hemoglobin A1c, Lipid Panel    PLAN:  Orders Placed This Encounter  Procedures  . CT Angio Head W/Cm &/Or Wo Cm  . CT Angio Neck W/Cm &/Or Wo/Cm  . Hemoglobin A1c  . Lipid Panel  . ECHOCARDIOGRAM COMPLETE   Return in about 1 month (around 01/27/2015).  I reviewed images, labs, notes, records myself. I summarized findings and reviewed with patient, for this high risk condition (TIA) requiring high complexity decision making.   Penni Bombard, MD 5/95/6387, 56:43 PM Certified in Neurology, Neurophysiology and Neuroimaging  [facility] [address], [place], [place] [address] [phone]

## 2014-12-28 ENCOUNTER — Telehealth: Payer: Self-pay | Admitting: Diagnostic Neuroimaging

## 2014-12-28 LAB — LIPID PANEL
Chol/HDL Ratio: 3.7 ratio units (ref 0.0–4.4)
Cholesterol, Total: 218 mg/dL — ABNORMAL HIGH (ref 100–199)
HDL: 59 mg/dL (ref >39)
LDL Calculated: 134 mg/dL — ABNORMAL HIGH (ref 0–99)
Triglycerides: 125 mg/dL (ref 0–149)
VLDL Cholesterol Cal: 25 mg/dL (ref 5–40)

## 2014-12-28 LAB — HEMOGLOBIN A1C
Est. average glucose Bld gHb Est-mCnc: 143 mg/dL
Hgb A1c MFr Bld: 6.6 % — ABNORMAL HIGH (ref 4.8–5.6)

## 2014-12-28 NOTE — Telephone Encounter (Signed)
I called patient. A1c, total cholesterol and LDLare high. Needs PCP. I gave her some recommendations of PCP practices. She declines medication therapy at this time. -VRP

## 2014-12-29 ENCOUNTER — Ambulatory Visit (INDEPENDENT_AMBULATORY_CARE_PROVIDER_SITE_OTHER): Payer: Medicare Other | Admitting: *Deleted

## 2014-12-29 DIAGNOSIS — I442 Atrioventricular block, complete: Secondary | ICD-10-CM

## 2014-12-29 LAB — CUP PACEART INCLINIC DEVICE CHECK
Battery Remaining Longevity: 134.4 mo
Battery Voltage: 2.95 V
Brady Statistic RA Percent Paced: 2.4 %
Brady Statistic RV Percent Paced: 0.58 %
Date Time Interrogation Session: 20160901121521
Lead Channel Impedance Value: 487.5 Ohm
Lead Channel Impedance Value: 712.5 Ohm
Lead Channel Pacing Threshold Amplitude: 0.5 V
Lead Channel Pacing Threshold Amplitude: 0.5 V
Lead Channel Pacing Threshold Amplitude: 0.5 V
Lead Channel Pacing Threshold Amplitude: 0.5 V
Lead Channel Pacing Threshold Pulse Width: 0.5 ms
Lead Channel Pacing Threshold Pulse Width: 0.5 ms
Lead Channel Pacing Threshold Pulse Width: 0.5 ms
Lead Channel Pacing Threshold Pulse Width: 0.5 ms
Lead Channel Sensing Intrinsic Amplitude: 12 mV
Lead Channel Sensing Intrinsic Amplitude: 5 mV
Lead Channel Setting Pacing Amplitude: 2 V
Lead Channel Setting Pacing Amplitude: 2.5 V
Lead Channel Setting Pacing Pulse Width: 0.5 ms
Lead Channel Setting Sensing Sensitivity: 2 mV
Pulse Gen Model: 2110
Pulse Gen Serial Number: 7423192
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
## 2015-01-06 ENCOUNTER — Ambulatory Visit
Admission: RE | Admit: 2015-01-06 | Discharge: 2015-01-06 | Disposition: A | Discharge status: Home / Self Care | Payer: Medicare Other | Source: Ambulatory Visit | Attending: Diagnostic Neuroimaging | Admitting: Diagnostic Neuroimaging

## 2015-01-06 DIAGNOSIS — G458 Other transient cerebral ischemic attacks and related syndromes: Secondary | ICD-10-CM

## 2015-01-06 DIAGNOSIS — R51 Headache: Secondary | ICD-10-CM | POA: Diagnosis not present

## 2015-01-06 MED ORDER — IOPAMIDOL (ISOVUE-370) INJECTION 76%
75.0000 mL | Freq: Once | INTRAVENOUS | Status: AC | PRN
  Administered 2015-01-06: 75 mL via INTRAVENOUS

## 2015-01-16 ENCOUNTER — Other Ambulatory Visit (HOSPITAL_COMMUNITY): Payer: Medicare Other

## 2015-01-17 DIAGNOSIS — H4011X2 Primary open-angle glaucoma, moderate stage: Secondary | ICD-10-CM | POA: Diagnosis not present

## 2015-01-19 ENCOUNTER — Encounter: Payer: Self-pay | Admitting: Internal Medicine

## 2015-01-19 ENCOUNTER — Ambulatory Visit (HOSPITAL_COMMUNITY): Payer: Medicare Other | Attending: Cardiology

## 2015-01-19 ENCOUNTER — Other Ambulatory Visit: Payer: Self-pay

## 2015-01-19 DIAGNOSIS — G459 Transient cerebral ischemic attack, unspecified: Secondary | ICD-10-CM | POA: Diagnosis not present

## 2015-01-19 DIAGNOSIS — I340 Nonrheumatic mitral (valve) insufficiency: Secondary | ICD-10-CM | POA: Insufficient documentation

## 2015-01-19 DIAGNOSIS — I517 Cardiomegaly: Secondary | ICD-10-CM | POA: Insufficient documentation

## 2015-01-19 DIAGNOSIS — Z950 Presence of cardiac pacemaker: Secondary | ICD-10-CM | POA: Insufficient documentation

## 2015-01-19 DIAGNOSIS — G458 Other transient cerebral ischemic attacks and related syndromes: Secondary | ICD-10-CM | POA: Diagnosis not present

## 2015-01-19 DIAGNOSIS — I10 Essential (primary) hypertension: Secondary | ICD-10-CM | POA: Insufficient documentation

## 2015-02-13 ENCOUNTER — Telehealth: Payer: Self-pay | Admitting: Diagnostic Neuroimaging

## 2015-02-13 NOTE — Telephone Encounter (Signed)
No additional changes suggested. -VRP

## 2015-02-13 NOTE — Telephone Encounter (Addendum)
Pt called stating she had  MRI with contrast on 01/06/15 she states the contrast was hot while being injected, she felt as if her hair was on fire, she experienced a flutter with her heart( she has pacemaker), she felt as if her veins were burning. A few days after her feet started tingling, it is now up to knees. Her hands started tingling about 1 week ago, she feels as if it is spreading. She still feels like her veins are burning. Pt has an appt with Dr Leta Baptist on 10/19 but she wanted him to be aware before. Please call and advise at [phone] or [phone]

## 2015-02-13 NOTE — Telephone Encounter (Signed)
Per Dr Leta Baptist, left VM for patient informing her that no changes are  at this time. Dr Leta Baptist will discuss with her further during her FU on 02/15/15. Left this caller's name, number if needed.

## 2015-02-15 ENCOUNTER — Ambulatory Visit (INDEPENDENT_AMBULATORY_CARE_PROVIDER_SITE_OTHER): Payer: Medicare Other | Admitting: Diagnostic Neuroimaging

## 2015-02-15 ENCOUNTER — Encounter: Payer: Self-pay | Admitting: Diagnostic Neuroimaging

## 2015-02-15 VITALS — BP 133/79 | HR 72 | Ht 64.0 in | Wt 140.2 lb

## 2015-02-15 DIAGNOSIS — G458 Other transient cerebral ischemic attacks and related syndromes: Secondary | ICD-10-CM

## 2015-02-15 DIAGNOSIS — R200 Anesthesia of skin: Secondary | ICD-10-CM

## 2015-02-15 NOTE — Progress Notes (Addendum)
[facility]  PATIENT: Charlene Barker DOB: [birth year]  REFERRING CLINICIAN: Benjamine Mola HISTORY FROM: patient  REASON FOR VISIT: follow up   HISTORICAL  CHIEF COMPLAINT:  Chief Complaint  Patient presents with  . Cerebral ischemia    rm 6  . Follow-up    2 month    HISTORY OF PRESENT ILLNESS:   UPDATE 02/15/15: Since last visit, patient had CT angiogram head / neck, had burning sensation in head and body during contrast, and has continued to have numbness and burning in hands, feet, head. She is very upset about having received contrast. Blood test results reviewed.   PRIOR HPI (12/27/14): 69 year old female with hypertension, glaucoma, recurrent syncope status post pacemaker, here for evaluation of left-sided numbness. July 2016 patient had 30 minute episode of left face, left arm, left leg numbness and tingling. Patient did not seek medical attention. Patient also had some dizziness and vertigo symptoms. Patient went to PCP and ENT for evaluation. ENT recognize possible TIA symptoms and referred patient to me for further evaluation. Patient did have some intermittent numbness and tingling throughout her body, typically triggered by anxiety and nervousness. Patient denies focal weakness, slurred speech, trouble swallowing, or other stroke symptoms.   REVIEW OF SYSTEMS: Full 14 system review of systems performed and notable only for numbness spinal stenosis and low back pacemaker aching muscles incontinence being sensation fatigue blurred vision.   ALLERGIES: Allergies  Allergen Reactions  . Penicillins   . Sulfa Drugs Cross Reactors   . Chlorhexidine Rash    HOME MEDICATIONS: Outpatient Prescriptions Prior to Visit  Medication Sig Dispense Refill  . aspirin 81 MG tablet Take 81 mg by mouth daily.    . dorzolamide (TRUSOPT) 2 % ophthalmic solution Place 1 drop into the left eye 2 (two) times daily.    Marland Kitchen ibuprofen (ADVIL,MOTRIN) 200 MG tablet Take 200 mg by  mouth every 6 (six) hours as needed. For pain    . latanoprost (XALATAN) 0.005 % ophthalmic solution Place 1 drop into both eyes at bedtime.     Marland Kitchen losartan (COZAAR) 50 MG tablet TAKE ONE TABLET BY MOUTH ONCE DAILY 90 tablet 3  . meclizine (ANTIVERT) 25 MG tablet Take 25 mg by mouth 3 (three) times daily as needed. For dizziness/vertigo    . naproxen sodium (ANAPROX) 220 MG tablet Take 220 mg by mouth as needed (for back pain).    . Nutritional Supplements (JUICE PLUS FIBRE PO) Take 1 tablet by mouth 2 (two) times daily.    . Pumpkin Seed-Soy Germ (AZO BLADDER CONTROL/GO-LESS) CAPS Take 1 capsule by mouth as needed (incontience).    . timolol (BETIMOL) 0.5 % ophthalmic solution Place 1 drop into both eyes every morning.      No facility-administered medications prior to visit.    PAST MEDICAL HISTORY: Past Medical History  Diagnosis Date  . HTN (hypertension)   . Syncope   . Left bundle branch block   . Lightheadedness     Associated with exercise  . Atrioventricular block, complete -intermittent     a. s/p STJ pacemaker  . 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 Drug-induced hyperkalemia -associated with Aldactone   . Pacemaker   . Glaucoma     bilateral    PAST SURGICAL HISTORY: Past Surgical History  Procedure Laterality Date  . Bladder surgery      sling  . Loop recorder implant N/A 09/20/2011    Procedure: LOOP RECORDER IMPLANT;  Surgeon: Revonda Standard  Caryl Comes, MD;  Location: [facility] CATH LAB;  Service: Cardiovascular;  Laterality: N/A;  . Permanent pacemaker insertion N/A 04/15/2012  . Eye surgery      several, bilateral    FAMILY HISTORY: Family History  Problem Relation Age of Onset  . Leukemia Mother   . Stroke Father   . Heart failure Father   . Heart attack Father   . Heart disease Father     SOCIAL HISTORY:  Social History   Social History  . Marital Status: Married    Spouse Name: Jori Moll  . Number of Children: 1  . Years of Education: 13   Occupational  History  . retired    Social History Main Topics  . Smoking status: Never Smoker   . Smokeless tobacco: Not on file  . Alcohol Use: No  . Drug Use: No  . Sexual Activity: Not on file   Other Topics Concern  . Not on file   Social History Narrative   Lives at home with husband   caffeine use - chocolate mainly     PHYSICAL EXAM  GENERAL EXAM/CONSTITUTIONAL: Vitals:  Filed Vitals:   02/15/15 1344  BP: 133/79  Pulse: 72  Height: 5\' 4"  (1.626 m)  Weight: 140 lb 3.2 oz (63.594 kg)   Body mass index is 24.05 kg/(m^2). No exam data present  Patient is in no distress; well developed, nourished and groomed; neck is supple  CARDIOVASCULAR:  Examination of carotid arteries is normal; no carotid bruits  Regular rate and rhythm, no murmurs  Examination of peripheral vascular system by observation and palpation is normal  EYES:  Ophthalmoscopic exam of optic discs and posterior segments is normal; no papilledema or hemorrhages  MUSCULOSKELETAL:  Gait, strength, tone, movements noted in Neurologic exam below  NEUROLOGIC: MENTAL STATUS:  No flowsheet data found.  awake, alert, oriented to person, place and time  recent and remote memory intact  normal attention and concentration  language fluent, comprehension intact, naming intact,   fund of knowledge appropriate  CRANIAL NERVE:   2nd - no papilledema on fundoscopic exam  2nd, 3rd, 4th, 6th - pupils equal and reactive to light, visual fields full to confrontation, extraocular muscles --> DECR ABDUCTION OF LEFT EYE ON LEFT GAZE WITH SUBJECTIVE BLURRED VISION, no nystagmus  5th - facial sensation symmetric  7th - facial strength symmetric  8th - hearing intact  9th - palate elevates symmetrically, uvula midline  11th - shoulder shrug symmetric  12th - tongue protrusion midline  MOTOR:   normal bulk and tone, full strength in the BUE, BLE  SENSORY:   normal and symmetric to light touch, pinprick,  temperature, vibration   COORDINATION:   finger-nose-finger, fine finger movements normal  REFLEXES:   deep tendon reflexes TRACE and symmetric  GAIT/STATION:   narrow based gait    DIAGNOSTIC DATA (LABS, IMAGING, TESTING) - I reviewed patient records, labs, notes, testing and imaging myself where available.  Lab Results  Component Value Date   WBC 10.2 04/16/2012   HGB 13.8 04/16/2012   HCT 39.8 04/16/2012   MCV 89.2 04/16/2012   PLT 213 04/16/2012      Component Value Date/Time   NA 138 08/24/2014 1459   K 4.0 08/24/2014 1459   CL 105 08/24/2014 1459   CO2 28 08/24/2014 1459   GLUCOSE 96 08/24/2014 1459   BUN 9 08/24/2014 1459   CREATININE 0.64 08/24/2014 1459   CALCIUM 9.8 08/24/2014 1459   GFRNONAA >90 04/16/2012 0555  GFRAA >90 04/16/2012 0555   Lab Results  Component Value Date   CHOL 218* 12/27/2014   HDL 59 12/27/2014   LDLCALC 134* 12/27/2014   TRIG 125 12/27/2014   CHOLHDL 3.7 12/27/2014   Lab Results  Component Value Date   HGBA1C 6.6* 12/27/2014   No results found for: VITAMINB12 Lab Results  Component Value Date   TSH 1.889 04/14/2012    12/09/13 CT lumbar [I reviewed images myself and agree with interpretation. -VRP]  1. Severe spinal stenosis at L4-5 due to a broad-based disc protrusion as well as hypertrophy of the ligamentum flavum.  2. Soft disc protrusion into the left neural foramen at L4-5 compresses the left L4 nerve. In addition, the left lateral recess is severely compressed at L4-5 and a right lateral recess is also impinged. 3. Slight spinal stenosis at L3-4 due to a broad-based disc bulge.  01/06/15 CTA head / neck [I reviewed images myself and agree with interpretation. -VRP]  1. Negative CTA Head and Neck arterial findings aside from ICA siphon calcified plaque not resulting in stenosis. Multiple normal anatomic variations. 2. No acute intracranial abnormality. Patchy white matter hypodensity most pronounced in the left  external capsule, favor small vessel disease related. 3. Functional stenosis left subclavian vein, likely related to the pacemaker leads.    ASSESSMENT AND PLAN  69 y.o. year old female here with 30 minute episode of left face, left arm, left leg numbness and tingling, concerning for possible transient ischemic attack. Patient has pacemaker and therefore we cannot get MRI of the brain. I checked CT angiogram of the head and neck --> no focal stenosis, and unfortunately she had some kind of long term reaction with burning sensation that has continued. This sounds like peripheral neuropathy related to diabetes, but patient is convinced that she has "burned" her arteries by the contrast. She is very upset that the IV contrast has harmed her body.   Encouraged her to establish with new PCP. For now she follows up with cardiology and ophthalmology.   Dx:  Other specified transient cerebral ischemias  Numbness    PLAN: I spent 15 minutes of face to face time with patient. Greater than 50% of time was spent in counseling and coordination of care with patient. In summary we discussed:  - follow up with PCP re: elevated LDL and A1c; patient does not want to take any meds for these now - continue aspirin - repeat CMP (ongoing numbness)  Return if symptoms worsen or fail to improve, for return to PCP.   Penni Bombard, MD 62/95/2841, 3:24 PM Certified in Neurology, Neurophysiology and Neuroimaging  [facility] [address], [place], [place] [address] [phone]

## 2015-02-15 NOTE — Patient Instructions (Signed)
Thank you for coming to see [place] at [facility]. I hope we have been able to provide you high quality care today.  You may receive a patient satisfaction survey over the next few weeks. We would appreciate your feedback and comments so that we may continue to improve ourselves and the health of our patients.  - continue aspirin - follow up with Dr. Joylene Draft (PCP) appointment   ~~~~~~~~~~~~~~~~~~~~~~~~~~~~~~~~~~~~~~~~~~~~~~~~~~~~~~~~~~~~~~~~~  DR. PENUMALLI'S GUIDE TO HAPPY AND HEALTHY LIVING These are some of my general health and wellness recommendations. Some of them may apply to you better than others. Please use common sense as you try these suggestions and feel free to ask me any questions.   ACTIVITY/FITNESS Mental, social, emotional and physical stimulation are very important for brain and body health. Try learning a new activity (arts, music, language, sports, games).  Keep moving your body to the best of your abilities. You can do this at home, inside or outside, the park, community center, gym or anywhere you like. Consider a physical therapist or personal trainer to get started. Consider the app Sworkit. Fitness trackers such as smart-watches, smart-phones or Fitbits can help as well.   NUTRITION Eat more plants: colorful vegetables, nuts, seeds and berries.  Eat less sugar, salt, preservatives and processed foods.  Avoid toxins such as cigarettes and alcohol.  Drink water when you are thirsty. Warm water with a slice of lemon is an excellent morning drink to start the day.  Consider these websites for more information The Nutrition Source (https://www.henry-hernandez.biz/) [employer] Nutrition (WindowBlog.ch)   RELAXATION Consider practicing mindfulness meditation or other relaxation techniques such as deep breathing, prayer, yoga, tai chi, massage. See website mindful.org or the apps Headspace or Calm to help get  started.   SLEEP Try to get at least 7-8+ hours sleep per day. Regular exercise and reduced caffeine will help you sleep better. Practice good sleep hygeine techniques. See website sleep.org for more information.   PLANNING Prepare estate planning, living will, healthcare POA documents. Sometimes this is best planned with the help of an attorney. Theconversationproject.org and agingwithdignity.org are excellent resources.

## 2015-02-15 NOTE — Addendum Note (Signed)
Addended byAndrey Spearman on: 02/15/2015 02:30 PM   Modules accepted: Orders

## 2015-02-16 ENCOUNTER — Telehealth: Payer: Self-pay | Admitting: *Deleted

## 2015-02-16 DIAGNOSIS — M4807 Spinal stenosis, lumbosacral region: Secondary | ICD-10-CM | POA: Diagnosis not present

## 2015-02-16 DIAGNOSIS — M5136 Other intervertebral disc degeneration, lumbar region: Secondary | ICD-10-CM | POA: Diagnosis not present

## 2015-02-16 LAB — COMPREHENSIVE METABOLIC PANEL
ALT: 60 IU/L — ABNORMAL HIGH (ref 0–32)
AST: 59 IU/L — ABNORMAL HIGH (ref 0–40)
Albumin/Globulin Ratio: 2 (ref 1.1–2.5)
Albumin: 4.4 g/dL (ref 3.6–4.8)
Alkaline Phosphatase: 80 IU/L (ref 39–117)
BUN/Creatinine Ratio: 11 (ref 11–26)
BUN: 9 mg/dL (ref 8–27)
Bilirubin Total: 0.6 mg/dL (ref 0.0–1.2)
CO2: 25 mmol/L (ref 18–29)
Calcium: 10 mg/dL (ref 8.7–10.3)
Chloride: 102 mmol/L (ref 97–106)
Creatinine, Ser: 0.82 mg/dL (ref 0.57–1.00)
GFR calc Af Amer: 84 mL/min/{1.73_m2} (ref >59)
GFR calc non Af Amer: 73 mL/min/{1.73_m2} (ref >59)
Globulin, Total: 2.2 g/dL (ref 1.5–4.5)
Glucose: 118 mg/dL — ABNORMAL HIGH (ref 65–99)
Potassium: 5.2 mmol/L (ref 3.5–5.2)
Sodium: 141 mmol/L (ref 136–144)
Total Protein: 6.6 g/dL (ref 6.0–8.5)

## 2015-02-16 NOTE — Telephone Encounter (Signed)
Spoke with patient and informed her, per Dr Leta Baptist, her lab results are normal with slight variant in liver function. Per Dr Leta Baptist, informed her that this is not cause of her current c/o numbness, burning described in most recent OV note. Advised that when she establishes primary care with Dr Mable Paris, he will be able to follow up with labs to monitor her regularly. She is requesting her lab results be mailed to her; informed her she needs to sign release of information to receive that report. She stated she will come in and sign release. She verbalized understanding and appreciation for call.

## 2015-02-16 NOTE — Telephone Encounter (Signed)
Pt returned call . Please call her cell [phone]

## 2015-02-16 NOTE — Telephone Encounter (Signed)
Left VM requesting patient call back for lab results. Left this caller's name, number.

## 2015-03-01 DIAGNOSIS — N3281 Overactive bladder: Secondary | ICD-10-CM | POA: Diagnosis not present

## 2015-03-01 DIAGNOSIS — Z1231 Encounter for screening mammogram for malignant neoplasm of breast: Secondary | ICD-10-CM | POA: Diagnosis not present

## 2015-03-02 ENCOUNTER — Ambulatory Visit (INDEPENDENT_AMBULATORY_CARE_PROVIDER_SITE_OTHER): Payer: Medicare Other | Admitting: Neurology

## 2015-03-02 ENCOUNTER — Encounter: Payer: Self-pay | Admitting: Neurology

## 2015-03-02 VITALS — BP 152/79 | HR 68 | Ht 64.0 in | Wt 141.0 lb

## 2015-03-02 DIAGNOSIS — M48061 Spinal stenosis, lumbar region without neurogenic claudication: Secondary | ICD-10-CM | POA: Insufficient documentation

## 2015-03-02 DIAGNOSIS — M4806 Spinal stenosis, lumbar region: Secondary | ICD-10-CM | POA: Diagnosis not present

## 2015-03-02 DIAGNOSIS — E538 Deficiency of other specified B group vitamins: Secondary | ICD-10-CM | POA: Diagnosis not present

## 2015-03-02 DIAGNOSIS — R202 Paresthesia of skin: Secondary | ICD-10-CM

## 2015-03-02 HISTORY — DX: Spinal stenosis, lumbar region without neurogenic claudication: M48.061

## 2015-03-02 HISTORY — DX: Paresthesia of skin: R20.2

## 2015-03-02 NOTE — Patient Instructions (Addendum)
   We will check EMG and NCV to look at the nerve function in the legs.   Paresthesia Paresthesia is an abnormal burning or prickling sensation. This sensation is generally felt in the hands, arms, legs, or feet. However, it may occur in any part of the body. Usually, it is not painful. The feeling may be described as:  Tingling or numbness.  Pins and needles.  Skin crawling.  Buzzing.  Limbs falling asleep.  Itching. Most people experience temporary (transient) paresthesia at some time in their lives. Paresthesia may occur when you breathe too quickly (hyperventilation). It can also occur without any apparent cause. Commonly, paresthesia occurs when pressure is placed on a nerve. The sensation quickly goes away after the pressure is removed. For some people, however, paresthesia is a long-lasting (chronic) condition that is caused by an underlying disorder. If you continue to have paresthesia, you may need further medical evaluation. HOME CARE INSTRUCTIONS Watch your condition for any changes. Taking the following actions may help to lessen any discomfort that you are feeling:  Avoid drinking alcohol.  Try acupuncture or massage to help relieve your symptoms.  Keep all follow-up visits as directed by your health care provider. This is important. SEEK MEDICAL CARE IF:  You continue to have episodes of paresthesia.  Your burning or prickling feeling gets worse when you walk.  You have pain, cramps, or dizziness.  You develop a rash. SEEK IMMEDIATE MEDICAL CARE IF:  You feel weak.  You have trouble walking or moving.  You have problems with speech, understanding, or vision.  You feel confused.  You cannot control your bladder or bowel movements.  You have numbness after an injury.  You faint.   This information is not intended to replace advice given to you by your health care provider. Make sure you discuss any questions you have with your health care provider.    Document Released: 04/05/2002 Document Revised: 08/30/2014 Document Reviewed: 04/11/2014 Elsevier Interactive Patient Education [employer].

## 2015-03-02 NOTE — Progress Notes (Signed)
Reason for visit: Numbness in the legs  Referring physician: Dr. [name] is a 69 y.o. female  History of present illness:  Charlene Barker is a 69 year old left-handed white female with a history of a recent neurologic workup for a transient episode of left-sided numbness. 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 The patient has a cardiac pacemaker in place, she cannot have MRI evaluation. CT angiogram of the head and neck did not show any significant vascular stenosis. A 2-D echocardiogram did not show a source of cardiogenic emboli. Ejection fraction was 55%. The patient has been on aspirin therapy. The patient indicates that the CT angiogram was done on September 9, and she noted afterward she was having a burning sensation throughout the body. Within 3 days, she developed numbness in both legs up to the knees. This has been persistent. She has not noted any change in balance, she denies any weakness in the legs. She has chronic issues with urinary incontinence, this has not changed. The patient may have some occasional numbness in the hands, but the legs are more significantly involved. The patient has a history of significant lumbosacral spinal stenosis affecting the L4-5 level, with neuroforaminal stenosis that is severe off to the left at this level. The CT of the lumbar spine was done in August 2015, around the time the patient was experiencing some low back pain. The patient continues to have ongoing back pain, but she denies any radicular pain down either leg at this time. She does have some right hip discomfort following a fall. She denies any significant neck discomfort or pain down either arm. She is sent to this office for an evaluation.  Past Medical History  Diagnosis Date  . HTN (hypertension)   . Syncope   . Left bundle branch block   . Lightheadedness     Associated with exercise    . Atrioventricular block, complete -intermittent     a. s/p STJ pacemaker  . 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 Drug-induced hyperkalemia -associated with Aldactone   . Pacemaker   . Glaucoma     bilateral  . Anxiety   . GERD (gastroesophageal reflux disease)   . Glaucoma   . Paresthesia of both legs 03/02/2015  . Spinal stenosis of lumbar region 03/02/2015    L4-5    Past Surgical History  Procedure Laterality Date  . Bladder surgery      sling  . Loop recorder implant N/A 09/20/2011    Procedure: LOOP RECORDER IMPLANT;  Surgeon: Deboraha Sprang, MD;  Location: [facility] CATH LAB;  Service: Cardiovascular;  Laterality: N/A;  . Permanent pacemaker insertion N/A 04/15/2012  . Eye surgery      several, bilateral    Family History  Problem Relation Age of Onset  . Leukemia Mother   . Stroke Father   . Heart failure Father   . Heart attack Father   . Heart disease Father   . Diabetes Sister   . Asthma Brother   . Restless legs syndrome Brother     Social history:  reports that she has never smoked. She has never used smokeless tobacco. She reports that she does not drink alcohol or use illicit drugs.  Medications:  Prior to Admission medications   Medication Sig Start Date End Date Taking? Authorizing Provider  aspirin 81 MG tablet Take 81 mg by  mouth daily.   Yes Historical Provider, MD  dorzolamide (TRUSOPT) 2 % ophthalmic solution Place 1 drop into the left eye 2 (two) times daily.   Yes Historical Provider, MD  ibuprofen (ADVIL,MOTRIN) 200 MG tablet Take 200 mg by mouth every 6 (six) hours as needed. For pain   Yes Historical Provider, MD  ipratropium (ATROVENT) 0.06 % nasal spray Place 2 sprays into both nostrils 2 (two) times daily. Uses once a day   Yes Historical Provider, MD  latanoprost (XALATAN) 0.005 % ophthalmic solution Place 1 drop into both eyes at bedtime.    Yes Historical Provider, MD  losartan (COZAAR) 50 MG tablet TAKE ONE TABLET BY MOUTH ONCE DAILY  11/07/14  Yes [name], MD  meclizine (ANTIVERT) 25 MG tablet Take 25 mg by mouth 3 (three) times daily as needed. For dizziness/vertigo   Yes Historical Provider, MD  naproxen sodium (ANAPROX) 220 MG tablet Take 220 mg by mouth as needed (for back pain).   Yes Historical Provider, MD  Nutritional Supplements (JUICE PLUS FIBRE PO) Take 1 tablet by mouth 2 (two) times daily.   Yes Historical Provider, MD  Pumpkin Seed-Soy Germ (AZO BLADDER CONTROL/GO-LESS) CAPS Take 1 capsule by mouth as needed (incontience).   Yes Historical Provider, MD  timolol (BETIMOL) 0.5 % ophthalmic solution Place 1 drop into both eyes every morning.    Yes Historical Provider, MD      Allergies  Allergen Reactions  . Contrast Media [Iodinated Diagnostic Agents]     Numbness, burning  . Penicillins   . Sulfa Drugs Cross Reactors   . Chlorhexidine Rash    ROS:  Out of a complete 14 system review of symptoms, the patient complains only of the following symptoms, and all other reviewed systems are negative.  Moles Blurred vision, loss of vision, history of glaucoma Incontinence of the bladder Easy bruising Anxiety History of syncope prior to pacemaker placement  Blood pressure 152/79, pulse 68, height 5\' 4"  (1.626 m), weight 141 lb (63.957 kg).  Physical Exam  General: The patient is alert and cooperative at the time of the examination.  Eyes: Pupils are equal, round, and reactive to light. Discs are flat bilaterally.  Neck: The neck is supple, no carotid bruits are noted.  Respiratory: The respiratory examination is clear.  Cardiovascular: The cardiovascular examination reveals a regular rate and rhythm, no obvious murmurs or rubs are noted.  Skin: Extremities are without significant edema.  Neurologic Exam  Mental status: The patient is alert and oriented x 3 at the time of the examination. The patient has apparent normal recent and remote memory, with an apparently normal attention span and  concentration ability.  Cranial nerves: Facial symmetry is present. There is good sensation of the face to pinprick and soft touch bilaterally. The strength of the facial muscles and the muscles to head turning and shoulder shrug are normal bilaterally. Speech is well enunciated, no aphasia or dysarthria is noted. Extraocular movements are full, with exception of some restriction of superior gaze. Visual fields are full. The tongue is midline, and the patient has symmetric elevation of the soft palate. No obvious hearing deficits are noted. The patient has some masking of the face.  Motor: The motor testing reveals 5 over 5 strength of all 4 extremities. Good symmetric motor tone is noted throughout.  Sensory: Sensory testing is intact to pinprick, soft touch, vibration sensation, and position sense on all 4 extremities, with exception of a stocking pattern pinprick sensory  deficit up to the knees bilaterally. No evidence of extinction is noted.  Coordination: Cerebellar testing reveals good finger-nose-finger and heel-to-shin bilaterally.  Gait and station: Gait is normal. Tandem gait is slightly unsteady. With ambulation, there is a slight decrease in arm swing bilaterally. Romberg is negative, but is unsteady. The patient is able to walk on heels and the toes bilaterally. No drift is seen.  Reflexes: Deep tendon reflexes are symmetric and normal bilaterally, with exception that there is a decrease in the left knee jerk reflexes compared to the right. Ankle jerk reflexes are well-maintained bilaterally. Toes are downgoing bilaterally.   Assessment/Plan:  1. Lower extremity numbness  2. Mild gait disorder  3. Severe lumbosacral spinal stenosis, L4-5 level  The patient has noted some numbness in both legs that came on fairly shortly after a CT angiogram was done on 01/06/2015. It is not clear how or why this procedure induced the numbness. The patient does have lumbar spinal stenosis, this  could be the etiology of the leg numbness as spinal stenosis can present with peripheral neuropathy symptomatology. The presence of ankle jerk reflexes suggest that the numbness in the feet is likely not related to a true peripheral neuropathy. The patient will be sent for blood work today, she will be set up for nerve conduction studies on both legs, and on one arm. EMG will be done on the left leg. She will follow-up for the EMG evaluation.  Jill Alexanders MD 03/02/2015 6:36 PM  [facility] [address] [place],  [address]  Phone [phone] Fax [phone]

## 2015-03-06 LAB — MULTIPLE MYELOMA PANEL, SERUM
Albumin SerPl Elph-Mcnc: 4.1 g/dL (ref 2.9–4.4)
Albumin/Glob SerPl: 1.8 — ABNORMAL HIGH (ref 0.7–1.7)
Alpha 1: 0.2 g/dL (ref 0.0–0.4)
Alpha2 Glob SerPl Elph-Mcnc: 0.7 g/dL (ref 0.4–1.0)
B-Globulin SerPl Elph-Mcnc: 1 g/dL (ref 0.7–1.3)
Gamma Glob SerPl Elph-Mcnc: 0.5 g/dL (ref 0.4–1.8)
Globulin, Total: 2.4 g/dL (ref 2.2–3.9)
IgA/Immunoglobulin A, Serum: 148 mg/dL (ref 87–352)
IgG (Immunoglobin G), Serum: 517 mg/dL — ABNORMAL LOW (ref 700–1600)
IgM (Immunoglobulin M), Srm: 64 mg/dL (ref 26–217)
Total Protein: 6.5 g/dL (ref 6.0–8.5)

## 2015-03-06 LAB — ANGIOTENSIN CONVERTING ENZYME: Angio Convert Enzyme: 72 U/L (ref 14–82)

## 2015-03-06 LAB — RHEUMATOID FACTOR: Rhuematoid fact SerPl-aCnc: <10 IU/mL (ref 0.0–13.9)

## 2015-03-06 LAB — COPPER, SERUM: Copper: 105 ug/dL (ref 72–166)

## 2015-03-06 LAB — B. BURGDORFI ANTIBODIES: Lyme IgG/IgM Ab: <0.91 {ISR} (ref 0.00–0.90)

## 2015-03-06 LAB — VITAMIN B12: Vitamin B-12: 706 pg/mL (ref 211–946)

## 2015-03-06 LAB — SEDIMENTATION RATE: Sed Rate: 2 mm/hr (ref 0–40)

## 2015-03-06 LAB — ANA W/REFLEX: Anti Nuclear Antibody(ANA): NEGATIVE

## 2015-03-06 LAB — METHYLMALONIC ACID, SERUM: Methylmalonic Acid: 136 nmol/L (ref 0–378)

## 2015-03-07 DIAGNOSIS — Z961 Presence of intraocular lens: Secondary | ICD-10-CM | POA: Diagnosis not present

## 2015-03-07 DIAGNOSIS — H401132 Primary open-angle glaucoma, bilateral, moderate stage: Secondary | ICD-10-CM | POA: Diagnosis not present

## 2015-03-07 DIAGNOSIS — H2511 Age-related nuclear cataract, right eye: Secondary | ICD-10-CM | POA: Diagnosis not present

## 2015-03-09 DIAGNOSIS — Z1389 Encounter for screening for other disorder: Secondary | ICD-10-CM | POA: Diagnosis not present

## 2015-03-09 DIAGNOSIS — Z6824 Body mass index (BMI) 24.0-24.9, adult: Secondary | ICD-10-CM | POA: Diagnosis not present

## 2015-03-09 DIAGNOSIS — R202 Paresthesia of skin: Secondary | ICD-10-CM | POA: Diagnosis not present

## 2015-03-09 DIAGNOSIS — M545 Low back pain: Secondary | ICD-10-CM | POA: Diagnosis not present

## 2015-03-09 DIAGNOSIS — Z0000 Encounter for general adult medical examination without abnormal findings: Secondary | ICD-10-CM | POA: Diagnosis not present

## 2015-03-09 DIAGNOSIS — R945 Abnormal results of liver function studies: Secondary | ICD-10-CM | POA: Diagnosis not present

## 2015-03-09 DIAGNOSIS — J309 Allergic rhinitis, unspecified: Secondary | ICD-10-CM | POA: Diagnosis not present

## 2015-03-09 DIAGNOSIS — R32 Unspecified urinary incontinence: Secondary | ICD-10-CM | POA: Diagnosis not present

## 2015-03-09 DIAGNOSIS — I10 Essential (primary) hypertension: Secondary | ICD-10-CM | POA: Diagnosis not present

## 2015-03-30 ENCOUNTER — Telehealth: Payer: Self-pay | Admitting: Cardiology

## 2015-03-30 ENCOUNTER — Encounter: Payer: Medicare Other | Admitting: *Deleted

## 2015-03-30 NOTE — Telephone Encounter (Signed)
Spoke with pt and reminded pt of remote transmission that is due today. Pt verbalized understanding.   

## 2015-03-31 ENCOUNTER — Encounter: Payer: Self-pay | Admitting: Cardiology

## 2015-04-12 ENCOUNTER — Encounter: Payer: Self-pay | Admitting: Neurology

## 2015-05-23 DIAGNOSIS — H2511 Age-related nuclear cataract, right eye: Secondary | ICD-10-CM | POA: Diagnosis not present

## 2015-05-23 DIAGNOSIS — Z961 Presence of intraocular lens: Secondary | ICD-10-CM | POA: Diagnosis not present

## 2015-05-23 DIAGNOSIS — H401132 Primary open-angle glaucoma, bilateral, moderate stage: Secondary | ICD-10-CM | POA: Diagnosis not present

## 2015-06-08 DIAGNOSIS — N952 Postmenopausal atrophic vaginitis: Secondary | ICD-10-CM | POA: Diagnosis not present

## 2015-06-08 DIAGNOSIS — Z0000 Encounter for general adult medical examination without abnormal findings: Secondary | ICD-10-CM | POA: Diagnosis not present

## 2015-06-08 DIAGNOSIS — N3941 Urge incontinence: Secondary | ICD-10-CM | POA: Diagnosis not present

## 2015-06-08 DIAGNOSIS — N393 Stress incontinence (female) (male): Secondary | ICD-10-CM | POA: Diagnosis not present

## 2015-06-14 ENCOUNTER — Encounter: Payer: Self-pay | Admitting: Neurology

## 2015-06-21 DIAGNOSIS — R278 Other lack of coordination: Secondary | ICD-10-CM | POA: Diagnosis not present

## 2015-06-21 DIAGNOSIS — M6281 Muscle weakness (generalized): Secondary | ICD-10-CM | POA: Diagnosis not present

## 2015-06-21 DIAGNOSIS — M62838 Other muscle spasm: Secondary | ICD-10-CM | POA: Diagnosis not present

## 2015-07-18 DIAGNOSIS — H409 Unspecified glaucoma: Secondary | ICD-10-CM | POA: Diagnosis not present

## 2015-07-18 DIAGNOSIS — H401132 Primary open-angle glaucoma, bilateral, moderate stage: Secondary | ICD-10-CM | POA: Diagnosis not present

## 2015-08-30 DIAGNOSIS — R278 Other lack of coordination: Secondary | ICD-10-CM | POA: Diagnosis not present

## 2015-08-30 DIAGNOSIS — N393 Stress incontinence (female) (male): Secondary | ICD-10-CM | POA: Diagnosis not present

## 2015-08-30 DIAGNOSIS — M62838 Other muscle spasm: Secondary | ICD-10-CM | POA: Diagnosis not present

## 2015-08-30 DIAGNOSIS — M6281 Muscle weakness (generalized): Secondary | ICD-10-CM | POA: Diagnosis not present

## 2015-08-30 DIAGNOSIS — N3941 Urge incontinence: Secondary | ICD-10-CM | POA: Diagnosis not present

## 2015-09-11 DIAGNOSIS — M6281 Muscle weakness (generalized): Secondary | ICD-10-CM | POA: Diagnosis not present

## 2015-09-11 DIAGNOSIS — G8929 Other chronic pain: Secondary | ICD-10-CM | POA: Diagnosis not present

## 2015-09-11 DIAGNOSIS — R278 Other lack of coordination: Secondary | ICD-10-CM | POA: Diagnosis not present

## 2015-09-11 DIAGNOSIS — M62838 Other muscle spasm: Secondary | ICD-10-CM | POA: Diagnosis not present

## 2015-09-11 DIAGNOSIS — N3941 Urge incontinence: Secondary | ICD-10-CM | POA: Diagnosis not present

## 2015-09-11 DIAGNOSIS — M25551 Pain in right hip: Secondary | ICD-10-CM | POA: Diagnosis not present

## 2015-09-13 DIAGNOSIS — I10 Essential (primary) hypertension: Secondary | ICD-10-CM | POA: Diagnosis not present

## 2015-09-13 DIAGNOSIS — Z6824 Body mass index (BMI) 24.0-24.9, adult: Secondary | ICD-10-CM | POA: Diagnosis not present

## 2015-09-13 DIAGNOSIS — R7309 Other abnormal glucose: Secondary | ICD-10-CM | POA: Diagnosis not present

## 2015-09-13 DIAGNOSIS — R32 Unspecified urinary incontinence: Secondary | ICD-10-CM | POA: Diagnosis not present

## 2015-09-13 DIAGNOSIS — R945 Abnormal results of liver function studies: Secondary | ICD-10-CM | POA: Diagnosis not present

## 2015-09-20 ENCOUNTER — Other Ambulatory Visit: Payer: Self-pay | Admitting: Internal Medicine

## 2015-09-20 DIAGNOSIS — R945 Abnormal results of liver function studies: Secondary | ICD-10-CM

## 2015-09-20 DIAGNOSIS — R7989 Other specified abnormal findings of blood chemistry: Secondary | ICD-10-CM

## 2015-09-22 DIAGNOSIS — N393 Stress incontinence (female) (male): Secondary | ICD-10-CM | POA: Diagnosis not present

## 2015-09-22 DIAGNOSIS — N3941 Urge incontinence: Secondary | ICD-10-CM | POA: Diagnosis not present

## 2015-09-22 DIAGNOSIS — M6281 Muscle weakness (generalized): Secondary | ICD-10-CM | POA: Diagnosis not present

## 2015-09-22 DIAGNOSIS — R278 Other lack of coordination: Secondary | ICD-10-CM | POA: Diagnosis not present

## 2015-09-22 DIAGNOSIS — M62838 Other muscle spasm: Secondary | ICD-10-CM | POA: Diagnosis not present

## 2015-09-28 ENCOUNTER — Ambulatory Visit
Admission: RE | Admit: 2015-09-28 | Discharge: 2015-09-28 | Disposition: A | Discharge status: Home / Self Care | Payer: Medicare Other | Source: Ambulatory Visit | Attending: Internal Medicine | Admitting: Internal Medicine

## 2015-09-28 DIAGNOSIS — R7989 Other specified abnormal findings of blood chemistry: Secondary | ICD-10-CM

## 2015-09-28 DIAGNOSIS — R945 Abnormal results of liver function studies: Secondary | ICD-10-CM

## 2015-10-12 DIAGNOSIS — N3281 Overactive bladder: Secondary | ICD-10-CM | POA: Diagnosis not present

## 2015-10-12 DIAGNOSIS — N393 Stress incontinence (female) (male): Secondary | ICD-10-CM | POA: Diagnosis not present

## 2015-10-12 DIAGNOSIS — N952 Postmenopausal atrophic vaginitis: Secondary | ICD-10-CM | POA: Diagnosis not present

## 2015-10-17 DIAGNOSIS — Z961 Presence of intraocular lens: Secondary | ICD-10-CM | POA: Diagnosis not present

## 2015-10-17 DIAGNOSIS — H409 Unspecified glaucoma: Secondary | ICD-10-CM | POA: Diagnosis not present

## 2015-10-17 DIAGNOSIS — H401132 Primary open-angle glaucoma, bilateral, moderate stage: Secondary | ICD-10-CM | POA: Diagnosis not present

## 2015-10-17 DIAGNOSIS — H2511 Age-related nuclear cataract, right eye: Secondary | ICD-10-CM | POA: Diagnosis not present

## 2015-10-26 DIAGNOSIS — I10 Essential (primary) hypertension: Secondary | ICD-10-CM | POA: Diagnosis not present

## 2015-10-26 DIAGNOSIS — E119 Type 2 diabetes mellitus without complications: Secondary | ICD-10-CM | POA: Diagnosis not present

## 2015-10-26 DIAGNOSIS — Z6824 Body mass index (BMI) 24.0-24.9, adult: Secondary | ICD-10-CM | POA: Diagnosis not present

## 2015-11-07 DIAGNOSIS — H608X3 Other otitis externa, bilateral: Secondary | ICD-10-CM | POA: Diagnosis not present

## 2015-11-17 DIAGNOSIS — N3946 Mixed incontinence: Secondary | ICD-10-CM | POA: Diagnosis not present

## 2015-11-17 DIAGNOSIS — R278 Other lack of coordination: Secondary | ICD-10-CM | POA: Diagnosis not present

## 2015-11-17 DIAGNOSIS — M62838 Other muscle spasm: Secondary | ICD-10-CM | POA: Diagnosis not present

## 2015-11-17 DIAGNOSIS — M6281 Muscle weakness (generalized): Secondary | ICD-10-CM | POA: Diagnosis not present

## 2015-11-30 DIAGNOSIS — R278 Other lack of coordination: Secondary | ICD-10-CM | POA: Diagnosis not present

## 2015-11-30 DIAGNOSIS — N393 Stress incontinence (female) (male): Secondary | ICD-10-CM | POA: Diagnosis not present

## 2015-11-30 DIAGNOSIS — N3941 Urge incontinence: Secondary | ICD-10-CM | POA: Diagnosis not present

## 2015-11-30 DIAGNOSIS — M6281 Muscle weakness (generalized): Secondary | ICD-10-CM | POA: Diagnosis not present

## 2015-11-30 DIAGNOSIS — M62838 Other muscle spasm: Secondary | ICD-10-CM | POA: Diagnosis not present

## 2015-12-29 ENCOUNTER — Encounter: Payer: Self-pay | Admitting: Internal Medicine

## 2016-01-15 NOTE — Progress Notes (Signed)
Patient Care Team: Crist Infante, MD as PCP - General (Internal Medicine) Suella Broad, MD as Consulting Physician (Physical Medicine and Rehabilitation)   HPI  Charlene DELAHOUSSAYE is a 70 y.o. female Seen in followup for pacemaker implanted for syncope left bundle branch block and recorder demonstrated intermittent complete heart block. This was done December 2013.   no chest pain or shortness of breath  She has sturggled with back pain been ascribed  spinal stenosis.  Neuro interval eval reviewed and EMGS were ordered--but results not available  Her mother-in-law died in 12/18/22. This occurred sometime after she had made some recommendations to her doctor regarding medications. She is feeling like she continued to it. She is tearful and sad. She has recently been diagnosed with diabetes and is not walking because of her depression  Past Medical History:  Diagnosis Date  . Anxiety   . Atrioventricular block, complete -intermittent    a. s/p STJ pacemaker  . Drug-induced hyperkalemia -associated with Aldactone   . GERD (gastroesophageal reflux disease)   . Glaucoma    bilateral  . Glaucoma   . HTN (hypertension)   . Left bundle branch block   . Lightheadedness    Associated with exercise  . 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 Pacemaker   . Paresthesia of both legs 03/02/2015  . Spinal stenosis of lumbar region 03/02/2015   L4-5  . Syncope     Past Surgical History:  Procedure Laterality Date  . BLADDER SURGERY     sling  . EYE SURGERY     several, bilateral  . LOOP RECORDER IMPLANT N/A 09/20/2011   Procedure: LOOP RECORDER IMPLANT;  Surgeon: Deboraha Sprang, MD;  Location: [facility] CATH LAB;  Service: Cardiovascular;  Laterality: N/A;  . PERMANENT PACEMAKER INSERTION N/A 04/15/2012    Current Outpatient Prescriptions  Medication Sig Dispense Refill  . dorzolamide (TRUSOPT) 2 % ophthalmic solution Place 1 drop into the left eye 2 (two) times daily.    Marland Kitchen ibuprofen  (ADVIL,MOTRIN) 200 MG tablet Take 200 mg by mouth every 6 (six) hours as needed. For pain    . ipratropium (ATROVENT) 0.06 % nasal spray Place 2 sprays into both nostrils 2 (two) times daily. Uses once a day    . latanoprost (XALATAN) 0.005 % ophthalmic solution Place 1 drop into both eyes at bedtime.     Marland Kitchen losartan (COZAAR) 50 MG tablet TAKE ONE TABLET BY MOUTH ONCE DAILY 90 tablet 3  . meclizine (ANTIVERT) 25 MG tablet Take 25 mg by mouth 3 (three) times daily as needed. For dizziness/vertigo    . naproxen sodium (ANAPROX) 220 MG tablet Take 220 mg by mouth as needed (for back pain).    . Nutritional Supplements (JUICE PLUS FIBRE PO) Take 1 tablet by mouth 2 (two) times daily.    . Pumpkin Seed-Soy Germ (AZO BLADDER CONTROL/GO-LESS) CAPS Take 1 capsule by mouth as needed (incontience).    . timolol (BETIMOL) 0.5 % ophthalmic solution Place 1 drop into both eyes every morning.      No current facility-administered medications for this visit.     Allergies  Allergen Reactions  . Contrast Media [Iodinated Diagnostic Agents]     Numbness, burning  . Penicillins   . Sulfa Drugs Cross Reactors   . Chlorhexidine Rash    Review of Systems negative except from HPI and PMH  Physical Exam BP 120/64   Pulse 71   Ht 5\' 4"  (1.626  m)   Wt 140 lb 6.4 oz (63.7 kg)   BMI 24.10 kg/m  Well developed and well nourished in no acute distress HENT normal E scleral and icterus clear Neck Supple JVP flat; carotids brisk and full Clear to ausculation regular rate and rhythm, no murmurs gallops or rub Soft with active bowel sounds No clubbing cyanosis  Edema Alert and oriented, grossly normal motor and sensory function Affect sad and tearful Skin Warm and Dry  ECG demonstrates sinus rhythm 71 17/13/45 Left bundle branch block and left axis deviation  Assessment and  Plan  Intermittent complete heart block  Atrial lead oversensing--EMI   Syncope  Pacemaker-St. Jude  The patient's device  was interrogated.  The information was reviewed. No changes were made in the programming.    Hypertension  Depression     No syncope  AFib/modeswitch are false positive  BP well controlled  Sad and tearful--spoke with her PCP and he will follow up with her regarding depression

## 2016-01-16 ENCOUNTER — Encounter (INDEPENDENT_AMBULATORY_CARE_PROVIDER_SITE_OTHER): Payer: Self-pay

## 2016-01-16 ENCOUNTER — Encounter: Payer: Self-pay | Admitting: Internal Medicine

## 2016-01-16 ENCOUNTER — Ambulatory Visit (INDEPENDENT_AMBULATORY_CARE_PROVIDER_SITE_OTHER): Payer: Medicare Other | Admitting: Internal Medicine

## 2016-01-16 VITALS — BP 120/64 | HR 71 | Ht 64.0 in | Wt 140.4 lb

## 2016-01-16 DIAGNOSIS — Z950 Presence of cardiac pacemaker: Secondary | ICD-10-CM | POA: Diagnosis not present

## 2016-01-16 DIAGNOSIS — R55 Syncope and collapse: Secondary | ICD-10-CM | POA: Diagnosis not present

## 2016-01-16 DIAGNOSIS — I442 Atrioventricular block, complete: Secondary | ICD-10-CM

## 2016-01-16 LAB — CUP PACEART INCLINIC DEVICE CHECK
Battery Remaining Longevity: 134.4
Battery Voltage: 2.95 V
Brady Statistic RA Percent Paced: 1.3 %
Brady Statistic RV Percent Paced: 0.55 %
Date Time Interrogation Session: 20170919162133
Implantable Lead Implant Date: 20131218
Implantable Lead Implant Date: 20131218
Implantable Lead Location: 753859
Implantable Lead Location: 753860
Implantable Lead Model: 1944
Lead Channel Impedance Value: 562.5 Ohm
Lead Channel Impedance Value: 725 Ohm
Lead Channel Pacing Threshold Amplitude: 0.5 V
Lead Channel Pacing Threshold Amplitude: 0.5 V
Lead Channel Pacing Threshold Amplitude: 0.5 V
Lead Channel Pacing Threshold Amplitude: 0.5 V
Lead Channel Pacing Threshold Pulse Width: 0.5 ms
Lead Channel Pacing Threshold Pulse Width: 0.5 ms
Lead Channel Pacing Threshold Pulse Width: 0.5 ms
Lead Channel Pacing Threshold Pulse Width: 0.5 ms
Lead Channel Sensing Intrinsic Amplitude: 12 mV
Lead Channel Sensing Intrinsic Amplitude: 5 mV
Lead Channel Setting Pacing Amplitude: 2 V
Lead Channel Setting Pacing Amplitude: 2.5 V
Lead Channel Setting Pacing Pulse Width: 0.5 ms
Lead Channel Setting Sensing Sensitivity: 2 mV
Pulse Gen Model: 2110
Pulse Gen Serial Number: 7423192

## 2016-01-16 NOTE — Patient Instructions (Signed)
Medication Instructions: - Your physician recommends that you continue on your current medications as directed. Please refer to the Current Medication list given to you today.  Labwork: - none ordered  Procedures/Testing: - none ordered  Follow-Up: - Remote monitoring is used to monitor your Pacemaker of ICD from home. This monitoring reduces the number of office visits required to check your device to one time per year. It allows [place] to keep an eye on the functioning of your device to ensure it is working properly. You are scheduled for a device check from home on 04/16/16. You may send your transmission at any time that day. If you have a wireless device, the transmission will be sent automatically. After your physician reviews your transmission, you will receive a postcard with your next transmission date.  - Your physician wants you to follow-up in: 1 year with Dr. Caryl Comes. You will receive a reminder letter in the mail two months in advance. If you don't receive a letter, please call our office to schedule the follow-up appointment.   Any Additional Special Instructions Will Be Listed Below (If Applicable).     If you need a refill on your cardiac medications before your next appointment, please call your pharmacy.

## 2016-01-18 DIAGNOSIS — F329 Major depressive disorder, single episode, unspecified: Secondary | ICD-10-CM | POA: Diagnosis not present

## 2016-01-18 DIAGNOSIS — I10 Essential (primary) hypertension: Secondary | ICD-10-CM | POA: Diagnosis not present

## 2016-01-18 DIAGNOSIS — R202 Paresthesia of skin: Secondary | ICD-10-CM | POA: Diagnosis not present

## 2016-01-23 DIAGNOSIS — H401132 Primary open-angle glaucoma, bilateral, moderate stage: Secondary | ICD-10-CM | POA: Diagnosis not present

## 2016-02-06 DIAGNOSIS — M62838 Other muscle spasm: Secondary | ICD-10-CM | POA: Diagnosis not present

## 2016-02-06 DIAGNOSIS — R102 Pelvic and perineal pain: Secondary | ICD-10-CM | POA: Diagnosis not present

## 2016-02-06 DIAGNOSIS — R278 Other lack of coordination: Secondary | ICD-10-CM | POA: Diagnosis not present

## 2016-02-06 DIAGNOSIS — N3946 Mixed incontinence: Secondary | ICD-10-CM | POA: Diagnosis not present

## 2016-02-06 DIAGNOSIS — M6281 Muscle weakness (generalized): Secondary | ICD-10-CM | POA: Diagnosis not present

## 2016-02-23 DIAGNOSIS — R102 Pelvic and perineal pain: Secondary | ICD-10-CM | POA: Diagnosis not present

## 2016-02-23 DIAGNOSIS — M6281 Muscle weakness (generalized): Secondary | ICD-10-CM | POA: Diagnosis not present

## 2016-02-23 DIAGNOSIS — R278 Other lack of coordination: Secondary | ICD-10-CM | POA: Diagnosis not present

## 2016-02-23 DIAGNOSIS — N3946 Mixed incontinence: Secondary | ICD-10-CM | POA: Diagnosis not present

## 2016-02-23 DIAGNOSIS — M62838 Other muscle spasm: Secondary | ICD-10-CM | POA: Diagnosis not present

## 2016-02-26 DIAGNOSIS — J069 Acute upper respiratory infection, unspecified: Secondary | ICD-10-CM | POA: Diagnosis not present

## 2016-02-26 DIAGNOSIS — Z6823 Body mass index (BMI) 23.0-23.9, adult: Secondary | ICD-10-CM | POA: Diagnosis not present

## 2016-02-26 DIAGNOSIS — R05 Cough: Secondary | ICD-10-CM | POA: Diagnosis not present

## 2016-03-04 DIAGNOSIS — R102 Pelvic and perineal pain: Secondary | ICD-10-CM | POA: Diagnosis not present

## 2016-03-04 DIAGNOSIS — N393 Stress incontinence (female) (male): Secondary | ICD-10-CM | POA: Diagnosis not present

## 2016-03-04 DIAGNOSIS — R278 Other lack of coordination: Secondary | ICD-10-CM | POA: Diagnosis not present

## 2016-03-04 DIAGNOSIS — M62838 Other muscle spasm: Secondary | ICD-10-CM | POA: Diagnosis not present

## 2016-03-04 DIAGNOSIS — M6281 Muscle weakness (generalized): Secondary | ICD-10-CM | POA: Diagnosis not present

## 2016-03-11 DIAGNOSIS — M62838 Other muscle spasm: Secondary | ICD-10-CM | POA: Diagnosis not present

## 2016-03-11 DIAGNOSIS — M6281 Muscle weakness (generalized): Secondary | ICD-10-CM | POA: Diagnosis not present

## 2016-03-11 DIAGNOSIS — R102 Pelvic and perineal pain: Secondary | ICD-10-CM | POA: Diagnosis not present

## 2016-03-11 DIAGNOSIS — R278 Other lack of coordination: Secondary | ICD-10-CM | POA: Diagnosis not present

## 2016-03-11 DIAGNOSIS — N3946 Mixed incontinence: Secondary | ICD-10-CM | POA: Diagnosis not present

## 2016-03-18 DIAGNOSIS — M62838 Other muscle spasm: Secondary | ICD-10-CM | POA: Diagnosis not present

## 2016-03-18 DIAGNOSIS — R102 Pelvic and perineal pain: Secondary | ICD-10-CM | POA: Diagnosis not present

## 2016-03-18 DIAGNOSIS — M6281 Muscle weakness (generalized): Secondary | ICD-10-CM | POA: Diagnosis not present

## 2016-03-18 DIAGNOSIS — R278 Other lack of coordination: Secondary | ICD-10-CM | POA: Diagnosis not present

## 2016-03-18 DIAGNOSIS — N3946 Mixed incontinence: Secondary | ICD-10-CM | POA: Diagnosis not present

## 2016-03-19 DIAGNOSIS — Z1289 Encounter for screening for malignant neoplasm of other sites: Secondary | ICD-10-CM | POA: Diagnosis not present

## 2016-03-19 DIAGNOSIS — Z1231 Encounter for screening mammogram for malignant neoplasm of breast: Secondary | ICD-10-CM | POA: Diagnosis not present

## 2016-03-20 ENCOUNTER — Other Ambulatory Visit: Payer: Self-pay | Admitting: Obstetrics and Gynecology

## 2016-03-20 DIAGNOSIS — E2839 Other primary ovarian failure: Secondary | ICD-10-CM

## 2016-03-26 DIAGNOSIS — R102 Pelvic and perineal pain: Secondary | ICD-10-CM | POA: Diagnosis not present

## 2016-03-26 DIAGNOSIS — M62838 Other muscle spasm: Secondary | ICD-10-CM | POA: Diagnosis not present

## 2016-03-26 DIAGNOSIS — N3946 Mixed incontinence: Secondary | ICD-10-CM | POA: Diagnosis not present

## 2016-03-26 DIAGNOSIS — M6281 Muscle weakness (generalized): Secondary | ICD-10-CM | POA: Diagnosis not present

## 2016-03-26 DIAGNOSIS — R278 Other lack of coordination: Secondary | ICD-10-CM | POA: Diagnosis not present

## 2016-03-29 DIAGNOSIS — E119 Type 2 diabetes mellitus without complications: Secondary | ICD-10-CM | POA: Diagnosis not present

## 2016-03-29 DIAGNOSIS — I10 Essential (primary) hypertension: Secondary | ICD-10-CM | POA: Diagnosis not present

## 2016-04-02 DIAGNOSIS — I10 Essential (primary) hypertension: Secondary | ICD-10-CM | POA: Diagnosis not present

## 2016-04-03 DIAGNOSIS — R102 Pelvic and perineal pain: Secondary | ICD-10-CM | POA: Diagnosis not present

## 2016-04-03 DIAGNOSIS — R278 Other lack of coordination: Secondary | ICD-10-CM | POA: Diagnosis not present

## 2016-04-03 DIAGNOSIS — N3946 Mixed incontinence: Secondary | ICD-10-CM | POA: Diagnosis not present

## 2016-04-03 DIAGNOSIS — M6281 Muscle weakness (generalized): Secondary | ICD-10-CM | POA: Diagnosis not present

## 2016-04-03 DIAGNOSIS — M62838 Other muscle spasm: Secondary | ICD-10-CM | POA: Diagnosis not present

## 2016-04-05 ENCOUNTER — Other Ambulatory Visit: Payer: Self-pay | Admitting: Internal Medicine

## 2016-04-05 DIAGNOSIS — Z0000 Encounter for general adult medical examination without abnormal findings: Secondary | ICD-10-CM | POA: Diagnosis not present

## 2016-04-05 DIAGNOSIS — G459 Transient cerebral ischemic attack, unspecified: Secondary | ICD-10-CM | POA: Diagnosis not present

## 2016-04-05 DIAGNOSIS — Z1389 Encounter for screening for other disorder: Secondary | ICD-10-CM | POA: Diagnosis not present

## 2016-04-05 DIAGNOSIS — Z950 Presence of cardiac pacemaker: Secondary | ICD-10-CM | POA: Diagnosis not present

## 2016-04-05 DIAGNOSIS — R202 Paresthesia of skin: Secondary | ICD-10-CM | POA: Diagnosis not present

## 2016-04-05 DIAGNOSIS — Z6823 Body mass index (BMI) 23.0-23.9, adult: Secondary | ICD-10-CM | POA: Diagnosis not present

## 2016-04-05 DIAGNOSIS — F329 Major depressive disorder, single episode, unspecified: Secondary | ICD-10-CM | POA: Diagnosis not present

## 2016-04-05 DIAGNOSIS — I10 Essential (primary) hypertension: Secondary | ICD-10-CM | POA: Diagnosis not present

## 2016-04-05 DIAGNOSIS — R32 Unspecified urinary incontinence: Secondary | ICD-10-CM | POA: Diagnosis not present

## 2016-04-05 DIAGNOSIS — E784 Other hyperlipidemia: Secondary | ICD-10-CM | POA: Diagnosis not present

## 2016-04-05 DIAGNOSIS — J3089 Other allergic rhinitis: Secondary | ICD-10-CM | POA: Diagnosis not present

## 2016-04-05 DIAGNOSIS — E119 Type 2 diabetes mellitus without complications: Secondary | ICD-10-CM | POA: Diagnosis not present

## 2016-04-10 ENCOUNTER — Other Ambulatory Visit (HOSPITAL_COMMUNITY): Payer: Self-pay | Admitting: Internal Medicine

## 2016-04-10 ENCOUNTER — Ambulatory Visit (HOSPITAL_COMMUNITY)
Admission: RE | Admit: 2016-04-10 | Discharge: 2016-04-10 | Disposition: A | Discharge status: Home / Self Care | Payer: Medicare Other | Source: Ambulatory Visit | Attending: Vascular Surgery | Admitting: Vascular Surgery

## 2016-04-10 DIAGNOSIS — R200 Anesthesia of skin: Secondary | ICD-10-CM | POA: Diagnosis present

## 2016-04-10 DIAGNOSIS — G459 Transient cerebral ischemic attack, unspecified: Secondary | ICD-10-CM | POA: Diagnosis not present

## 2016-04-11 DIAGNOSIS — Z1212 Encounter for screening for malignant neoplasm of rectum: Secondary | ICD-10-CM | POA: Diagnosis not present

## 2016-04-12 ENCOUNTER — Other Ambulatory Visit: Payer: Medicare Other

## 2016-04-12 ENCOUNTER — Ambulatory Visit
Admission: RE | Admit: 2016-04-12 | Discharge: 2016-04-12 | Disposition: A | Discharge status: Home / Self Care | Payer: Medicare Other | Source: Ambulatory Visit | Attending: Internal Medicine | Admitting: Internal Medicine

## 2016-04-12 DIAGNOSIS — R200 Anesthesia of skin: Secondary | ICD-10-CM | POA: Diagnosis not present

## 2016-04-12 DIAGNOSIS — G459 Transient cerebral ischemic attack, unspecified: Secondary | ICD-10-CM

## 2016-04-16 ENCOUNTER — Encounter: Payer: Medicare Other | Admitting: *Deleted

## 2016-04-16 ENCOUNTER — Telehealth: Payer: Self-pay | Admitting: Cardiology

## 2016-04-16 NOTE — Telephone Encounter (Signed)
LMOVM reminding pt to send remote transmission.   

## 2016-04-19 ENCOUNTER — Encounter: Payer: Self-pay | Admitting: Cardiology

## 2016-04-25 ENCOUNTER — Ambulatory Visit (INDEPENDENT_AMBULATORY_CARE_PROVIDER_SITE_OTHER): Payer: Medicare Other | Admitting: *Deleted

## 2016-04-25 DIAGNOSIS — I442 Atrioventricular block, complete: Secondary | ICD-10-CM

## 2016-04-25 NOTE — Progress Notes (Signed)
Remote pacemaker transmission.   

## 2016-04-26 ENCOUNTER — Encounter: Payer: Self-pay | Admitting: Cardiology

## 2016-05-03 LAB — CUP PACEART REMOTE DEVICE CHECK
Battery Remaining Longevity: 119 mo
Battery Remaining Percentage: 91 %
Battery Voltage: 2.95 V
Brady Statistic AP VP Percent: 1 %
Brady Statistic AP VS Percent: 1.4 %
Brady Statistic AS VP Percent: 1 %
Brady Statistic AS VS Percent: 98 %
Brady Statistic RA Percent Paced: 1.3 %
Brady Statistic RV Percent Paced: 1 %
Date Time Interrogation Session: 20171228154704
Implantable Lead Implant Date: 20131218
Implantable Lead Implant Date: 20131218
Implantable Lead Location: 753859
Implantable Lead Location: 753860
Implantable Lead Model: 1944
Implantable Pulse Generator Implant Date: 20131218
Lead Channel Impedance Value: 410 Ohm
Lead Channel Impedance Value: 660 Ohm
Lead Channel Pacing Threshold Amplitude: 0.5 V
Lead Channel Pacing Threshold Amplitude: 0.5 V
Lead Channel Pacing Threshold Pulse Width: 0.5 ms
Lead Channel Pacing Threshold Pulse Width: 0.5 ms
Lead Channel Sensing Intrinsic Amplitude: 12 mV
Lead Channel Sensing Intrinsic Amplitude: 4.2 mV
Lead Channel Setting Pacing Amplitude: 2 V
Lead Channel Setting Pacing Amplitude: 2.5 V
Lead Channel Setting Pacing Pulse Width: 0.5 ms
Lead Channel Setting Sensing Sensitivity: 2 mV
Pulse Gen Model: 2110
Pulse Gen Serial Number: 7423192

## 2016-05-07 DIAGNOSIS — H2511 Age-related nuclear cataract, right eye: Secondary | ICD-10-CM | POA: Diagnosis not present

## 2016-05-07 DIAGNOSIS — Z961 Presence of intraocular lens: Secondary | ICD-10-CM | POA: Diagnosis not present

## 2016-05-07 DIAGNOSIS — H401132 Primary open-angle glaucoma, bilateral, moderate stage: Secondary | ICD-10-CM | POA: Diagnosis not present

## 2016-05-08 DIAGNOSIS — M859 Disorder of bone density and structure, unspecified: Secondary | ICD-10-CM | POA: Diagnosis not present

## 2016-07-16 ENCOUNTER — Emergency Department (HOSPITAL_COMMUNITY)
Admission: EM | Admit: 2016-07-16 | Discharge: 2016-07-17 | Disposition: A | Discharge status: Home / Self Care | Payer: Medicare Other | Attending: Emergency Medicine | Admitting: Emergency Medicine

## 2016-07-16 ENCOUNTER — Emergency Department (HOSPITAL_COMMUNITY): Payer: Medicare Other

## 2016-07-16 ENCOUNTER — Encounter (HOSPITAL_COMMUNITY): Payer: Self-pay

## 2016-07-16 DIAGNOSIS — Z950 Presence of cardiac pacemaker: Secondary | ICD-10-CM | POA: Diagnosis not present

## 2016-07-16 DIAGNOSIS — R200 Anesthesia of skin: Secondary | ICD-10-CM

## 2016-07-16 DIAGNOSIS — Z79899 Other long term (current) drug therapy: Secondary | ICD-10-CM | POA: Insufficient documentation

## 2016-07-16 DIAGNOSIS — Z91040 Latex allergy status: Secondary | ICD-10-CM | POA: Insufficient documentation

## 2016-07-16 DIAGNOSIS — I10 Essential (primary) hypertension: Secondary | ICD-10-CM | POA: Diagnosis not present

## 2016-07-16 DIAGNOSIS — Z5181 Encounter for therapeutic drug level monitoring: Secondary | ICD-10-CM | POA: Diagnosis not present

## 2016-07-16 LAB — I-STAT CHEM 8, ED
BUN: 7 mg/dL (ref 6–20)
Calcium, Ion: 1.22 mmol/L (ref 1.15–1.40)
Chloride: 104 mmol/L (ref 101–111)
Creatinine, Ser: 0.6 mg/dL (ref 0.44–1.00)
Glucose, Bld: 160 mg/dL — ABNORMAL HIGH (ref 65–99)
HCT: 39 % (ref 36.0–46.0)
Hemoglobin: 13.3 g/dL (ref 12.0–15.0)
Potassium: 4.1 mmol/L (ref 3.5–5.1)
Sodium: 140 mmol/L (ref 135–145)
TCO2: 26 mmol/L (ref 0–100)

## 2016-07-16 LAB — APTT: aPTT: 33 seconds (ref 24–36)

## 2016-07-16 LAB — COMPREHENSIVE METABOLIC PANEL
ALT: 75 U/L — ABNORMAL HIGH (ref 14–54)
AST: 73 U/L — ABNORMAL HIGH (ref 15–41)
Albumin: 4.2 g/dL (ref 3.5–5.0)
Alkaline Phosphatase: 84 U/L (ref 38–126)
Anion gap: 10 (ref 5–15)
BUN: 5 mg/dL — ABNORMAL LOW (ref 6–20)
CO2: 25 mmol/L (ref 22–32)
Calcium: 9.6 mg/dL (ref 8.9–10.3)
Chloride: 104 mmol/L (ref 101–111)
Creatinine, Ser: 0.67 mg/dL (ref 0.44–1.00)
GFR calc Af Amer: >60 mL/min (ref >60)
GFR calc non Af Amer: >60 mL/min (ref >60)
Glucose, Bld: 162 mg/dL — ABNORMAL HIGH (ref 65–99)
Potassium: 4.1 mmol/L (ref 3.5–5.1)
Sodium: 139 mmol/L (ref 135–145)
Total Bilirubin: 0.7 mg/dL (ref 0.3–1.2)
Total Protein: 6.7 g/dL (ref 6.5–8.1)

## 2016-07-16 LAB — I-STAT TROPONIN, ED: Troponin i, poc: 0 ng/mL (ref 0.00–0.08)

## 2016-07-16 LAB — DIFFERENTIAL
Band Neutrophils: 0 %
Basophils Absolute: 0 10*3/uL (ref 0.0–0.1)
Basophils Relative: 0 %
Blasts: 0 %
Eosinophils Absolute: 0.5 10*3/uL (ref 0.0–0.7)
Eosinophils Relative: 5 %
Lymphocytes Relative: 36 %
Lymphs Abs: 3.2 10*3/uL (ref 0.7–4.0)
Metamyelocytes Relative: 0 %
Monocytes Absolute: 0.8 10*3/uL (ref 0.1–1.0)
Monocytes Relative: 9 %
Myelocytes: 0 %
Neutro Abs: 4.5 10*3/uL (ref 1.7–7.7)
Neutrophils Relative %: 50 %
Other: 0 %
Promyelocytes Absolute: 0 %
nRBC: 0 /100 WBC

## 2016-07-16 LAB — PROTIME-INR
INR: 1.03
Prothrombin Time: 13.5 seconds (ref 11.4–15.2)

## 2016-07-16 LAB — CBC
HCT: 37.1 % (ref 36.0–46.0)
Hemoglobin: 12.6 g/dL (ref 12.0–15.0)
MCH: 31 pg (ref 26.0–34.0)
MCHC: 34 g/dL (ref 30.0–36.0)
MCV: 91.2 fL (ref 78.0–100.0)
Platelets: 282 10*3/uL (ref 150–400)
RBC: 4.07 MIL/uL (ref 3.87–5.11)
RDW: 12.4 % (ref 11.5–15.5)
WBC: 9 10*3/uL (ref 4.0–10.5)

## 2016-07-16 NOTE — Discharge Instructions (Signed)
You were seen in the ED today with numbness in the left face and arm. The CT scan and labs were normal. We spoke with our Neurology team here who would like for you to see your Neurologist in the clinic to continue the evaluation for stroke and consideration of atypical seizure.   Return to the ED with any new or worsening symptoms such as weakness, numbness, chest pain, difficulty breathing, or sudden severe headache.

## 2016-07-16 NOTE — ED Provider Notes (Signed)
Emergency Department Provider Note   I have reviewed the triage vital signs and the nursing notes.   HISTORY  Chief Complaint Numbness   HPI Charlene Barker is a 71 y.o. female with PMH of HTN, LBBB, GERD, and prior episodes of left face numbness presents to the ED for evaluation of return of left face and left arm numbness. Symptoms started at 2:30 AM and have been persistent. She reports episodes in the past with similar numbness symptoms in the same distribution. Typically, these resolve relatively quickly. She has seen her PCP and a Neurologist regarding the symptoms in the past. Reports a known area of old stroke but cannot undergo MRI because of pacemaker.   Today, she noted onset of symptoms in the left face and left arm. Slight, mild pain in the left arm with no radiation. No leg symptoms. No gait disturbance. No fever, chills, or HA. No speech changes or difficulty swallowing. When symptoms did not resolve she presented to the ED.   Past Medical History:  Diagnosis Date  . Anxiety   . Atrioventricular block, complete -intermittent       . Drug-induced hyperkalemia -associated with Aldactone   . GERD (gastroesophageal reflux disease)   . Glaucoma    bilateral  . HTN (hypertension)   . Left bundle branch block   . Lightheadedness    Associated with exercise  . 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 Pacemaker    St Jude  . Paresthesia of both legs 03/02/2015  . Spinal stenosis of lumbar region 03/02/2015   L4-5  . Syncope     Patient Active Problem List   Diagnosis Date Noted  . Paresthesia of both legs 03/02/2015  . Spinal stenosis of lumbar region 03/02/2015  . Other specified transient cerebral ischemias 12/27/2014  . 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 Anxiety 08/13/2012  . Complete heart block ([place]) 04/14/2012  . Pacemaker--St Judes 01/07/2012  . Syncope   . Left bundle branch block   . Lightheadedness     Past Surgical History:    Procedure Laterality Date  . BLADDER SURGERY     sling  . EYE SURGERY     several, bilateral  . LOOP RECORDER IMPLANT N/A 09/20/2011   Procedure: LOOP RECORDER IMPLANT;  Surgeon: Deboraha Sprang, MD;  Location: [facility] CATH LAB;  Service: Cardiovascular;  Laterality: N/A;  . PERMANENT PACEMAKER INSERTION N/A 04/15/2012    Current Outpatient Rx  . Order #: [id number] Class: Historical Med  . Order #: [id number] Class: Historical Med  . Order #: [id number] Class: Historical Med  . Order #: [id number] Class: Historical Med  . Order #: [id number] Class: Historical Med  . Order #: [id number] Class: Historical Med  . Order #: [id number] Class: Historical Med  . Order #: [id number] Class: Historical Med  . Order #: [id number] Class: Print  . Order #: [id number] Class: Historical Med  . Order #: [id number] Class: Historical Med  . Order #: [id number] Class: Historical Med  . Order #: [id number] Class: Historical Med  . Order #: [id number] Class: Historical Med    Allergies Codeine; Contrast media [iodinated diagnostic agents]; Penicillins; Sulfa drugs cross reactors; Chlorhexidine; Latex; and Povidone  Family History  Problem Relation Age of Onset  . Leukemia Mother   . Stroke Father   . Heart failure Father   . Heart attack Father   . Heart disease Father   . Diabetes Sister   . Asthma Brother   . Restless legs syndrome Brother     Social History Social  History  Substance Use Topics  . Smoking status: Never Smoker  . Smokeless tobacco: Never Used  . Alcohol use No    Review of Systems  Constitutional: No fever/chills Eyes: No visual changes. ENT: No sore throat. Cardiovascular: Denies chest pain. Respiratory: Denies shortness of breath. Gastrointestinal: No abdominal pain.  No nausea, no vomiting.  No diarrhea.  No constipation. Genitourinary: Negative for dysuria. Musculoskeletal: Negative for back pain. Skin: Negative for rash. Neurological: Negative for headaches, focal weakness. Positive left  face and arm numbness.   10-point ROS otherwise negative.  ____________________________________________   PHYSICAL EXAM:  VITAL SIGNS: ED Triage Vitals  Enc Vitals Group     BP 07/16/16 1514 (!) 161/63     Pulse Rate 07/16/16 1514 65     Resp 07/16/16 1514 16     Temp 07/16/16 1514 97.8 F (36.6 C)     Temp Source 07/16/16 1514 Oral     SpO2 07/16/16 1514 98 %     Weight 07/16/16 1525 136 lb 9.6 oz (62 kg)     Height 07/16/16 1525 5\' 4"  (1.626 m)   Constitutional: Alert and oriented. Well appearing and in no acute distress. Eyes: Conjunctivae are normal. PERRL. EOMI. Head: Atraumatic. Nose: No congestion/rhinnorhea. Mouth/Throat: Mucous membranes are moist.  Oropharynx non-erythematous. Neck: No stridor.   Cardiovascular: Normal rate, regular rhythm. Good peripheral circulation. Grossly normal heart sounds.   Respiratory: Normal respiratory effort.  No retractions. Lungs CTAB. Gastrointestinal: Soft and nontender. No distention.  Musculoskeletal: No lower extremity tenderness nor edema. No gross deformities of extremities. Neurologic:  Normal speech and language. Normal strength in the bilateral upper and lower extremities. Subjective decreased sensation to the left face and entire LUE. No face droop. No pronator drift. No cerebellar ataxia.  Skin:  Skin is warm, dry and intact. No rash noted. Psychiatric: Mood and affect are normal. Speech and behavior are normal.  ____________________________________________   LABS (all labs ordered are listed, but only abnormal results are displayed)  Labs Reviewed  COMPREHENSIVE METABOLIC PANEL - Abnormal; Notable for the following:       Result Value   Glucose, Bld 162 (*)    BUN 5 (*)    AST 73 (*)    ALT 75 (*)    All other components within normal limits  I-STAT CHEM 8, ED - Abnormal; Notable for the following:    Glucose, Bld 160 (*)    All other components within normal limits  PROTIME-INR  APTT  CBC  DIFFERENTIAL    I-STAT TROPOININ, ED  CBG MONITORING, ED   ____________________________________________  EKG   EKG Interpretation  Date/Time:  Tuesday July 16 2016 15:16:42 EDT Ventricular Rate:  67 PR Interval:  172 QRS Duration: 122 QT Interval:  454 QTC Calculation: 479 R Axis:   -53 Text Interpretation:  Normal sinus rhythm Left axis deviation Left bundle branch block Abnormal ECG Similar to prior. No STEMI.  Confirmed by Charan Prieto MD, Letica Giaimo [phone]) on 07/17/2016 10:13:06 AM       ____________________________________________  RADIOLOGY  Ct Head Wo Contrast  Result Date: 07/16/2016 CLINICAL DATA:  Left side of face tingling and numbness. EXAM: CT HEAD WITHOUT CONTRAST TECHNIQUE: Contiguous axial images were obtained from the base of the skull through the vertex without intravenous contrast. COMPARISON:  04/12/2016 are FINDINGS: Brain: Ovoid hypodense lesion in the LEFT external capsule (image 14, series 3) is unchanged from 2016. No acute intracranial hemorrhage. No focal mass lesion. No CT evidence of acute  infarction. No midline shift or mass effect. No hydrocephalus. Basilar cisterns are patent. Vascular: No hyperdense vessel or unexpected calcification. Skull: Normal. Negative for fracture or focal lesion. Sinuses/Orbits: Paranasal sinuses and mastoid air cells are clear. Orbits are clear. Other: None. IMPRESSION: 1. No acute intracranial findings. 2. 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 Electronically Signed   By: Suzy Bouchard M.D.   On: 07/16/2016 16:51    ____________________________________________   PROCEDURES  Procedure(s) performed:   Procedures  None ____________________________________________   INITIAL IMPRESSION / ASSESSMENT AND PLAN / ED COURSE  Pertinent labs & imaging results that were available during my care of the patient were reviewed by me and considered in my medical decision making (see chart for details).  Spoke with Dr. Nicole Kindred  regarding the case and CT findings with remote infarct. Does not believe that the symptoms correlate with the patient's exam findings. She is unable to have MRI due to pacemaker. He recommends outpatient follow up with Neurology to continue CVA w/u as an outpatient. Also considering focal seizure activity which can also be evaluated on an outpatient basis.   Discussed case with patient and husband at bedside. Plan for Neurology appointment scheduling in the coming week. No other lab abnormality to explain symptoms.   At this time, I do not feel there is any life-threatening condition present. I have reviewed and discussed all results (EKG, imaging, lab, urine as appropriate), exam findings with patient. I have reviewed nursing notes and appropriate previous records.  I feel the patient is safe to be discharged home without further emergent workup. Discussed usual and customary return precautions. Patient and family (if present) verbalize understanding and are comfortable with this plan.  Patient will follow-up with their primary care provider. If they do not have a primary care provider, information for follow-up has been provided to them. All questions have been answered.    ____________________________________________  FINAL CLINICAL IMPRESSION(S) / ED DIAGNOSES  Final diagnoses:  Numbness     MEDICATIONS GIVEN DURING THIS VISIT:  None  NEW OUTPATIENT MEDICATIONS STARTED DURING THIS VISIT:  None   Note:  This document was prepared using Dragon voice recognition software and may include unintentional dictation errors.  Nanda Quinton, MD Emergency Medicine     Margette Fast, MD 07/17/16 1016

## 2016-07-16 NOTE — ED Notes (Signed)
Pt has a St. Jude pacemaker implanted on 04/15/2012 Model number [id number]  Serial number: [id number]  Physician: Virl Axe [phone]

## 2016-07-16 NOTE — ED Triage Notes (Signed)
Onset 2:30am pt was getting ready to go to bed and felt numbness to left side of face, left side of head, and left arm.  No facial droop, hand grips equal.

## 2016-07-17 ENCOUNTER — Telehealth: Payer: Self-pay | Admitting: Neurology

## 2016-07-17 NOTE — Telephone Encounter (Signed)
error 

## 2016-07-17 NOTE — Telephone Encounter (Signed)
Checked status of appt. Pt was scheduled for 07/18/16 at 10am

## 2016-07-17 NOTE — Telephone Encounter (Addendum)
Pt was seen at ED yesterday for left face and arm numbness. Advised to f/u with neurologist. appt scheduled for tomorrow

## 2016-07-18 ENCOUNTER — Ambulatory Visit (INDEPENDENT_AMBULATORY_CARE_PROVIDER_SITE_OTHER): Payer: Medicare Other | Admitting: Neurology

## 2016-07-18 ENCOUNTER — Encounter: Payer: Self-pay | Admitting: Neurology

## 2016-07-18 VITALS — BP 121/65 | HR 67 | Ht 64.0 in | Wt 138.5 lb

## 2016-07-18 DIAGNOSIS — R202 Paresthesia of skin: Secondary | ICD-10-CM | POA: Diagnosis not present

## 2016-07-18 DIAGNOSIS — R251 Tremor, unspecified: Secondary | ICD-10-CM | POA: Diagnosis not present

## 2016-07-18 NOTE — Progress Notes (Signed)
Reason for visit: Left sided numbness  Referring physician: Arthur  Charlene Barker is a 71 y.o. female  History of present illness:  Charlene Barker is a 71 year old left-handed white female with a history of episodic left-sided numbness sensations. The patient was seen in 2016 for this reason, she has had a workup that included a CT angiogram of the head and neck that was unremarkable, and a 2-D echocardiogram that did not show any source of cardioembolic stroke. The patient has been on low-dose aspirin taking 81 mg daily. The patient had an event on 07/16/2016 that was prolonged lasting 4 or 5 hours. The patient went to the emergency room for an evaluation. A CT scan of the brain was done and did not show any acute changes. The patient has had resolution of her symptoms since that time. She indicates that the numbness may occur several times a year, usually starting on the face and spreading to the arm and leg. The patient denies any weakness with the episode or any change in vision or speech. The patient has had a tremor that has developed over the last several months involving the left arm that affects her mainly when she is feeding herself. The patient is left-handed. She has not noted any significant change in handwriting. She believes that she has had some change in her balance, she denies any recent falls. She claims that she has had a carotid Doppler study done within the last year that was unremarkable. The results of this are not available to me. She has noted that the numbness in the left face and arm may come on associated with some anxiety issues. The patient is sent to this office for further evaluation.  Past Medical History:  Diagnosis Date  . Anxiety   . Atrioventricular block, complete -intermittent       . Drug-induced hyperkalemia -associated with Aldactone   . GERD (gastroesophageal reflux disease)   . Glaucoma    bilateral  . HTN (hypertension)   . Left bundle branch  block   . Lightheadedness    Associated with exercise  . 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 Pacemaker    St Jude  . Paresthesia of both legs 03/02/2015  . Spinal stenosis of lumbar region 03/02/2015   L4-5  . Syncope     Past Surgical History:  Procedure Laterality Date  . BLADDER SURGERY     sling  . EYE SURGERY     several, bilateral  . LOOP RECORDER IMPLANT N/A 09/20/2011   Procedure: LOOP RECORDER IMPLANT;  Surgeon: Deboraha Sprang, MD;  Location: [facility] CATH LAB;  Service: Cardiovascular;  Laterality: N/A;  . PERMANENT PACEMAKER INSERTION N/A 04/15/2012    Family History  Problem Relation Age of Onset  . Leukemia Mother   . Stroke Father   . Heart failure Father   . Heart attack Father   . Heart disease Father   . Diabetes Sister   . Asthma Brother   . Restless legs syndrome Brother     Social history:  reports that she has never smoked. She has never used smokeless tobacco. She reports that she does not drink alcohol or use drugs.  Medications:  Prior to Admission medications   Medication Sig Start Date End Date Taking? Authorizing Provider  acetaminophen (TYLENOL) 650 MG CR tablet Take 650 mg by mouth daily as needed (for headaches, discomfort or back pain).   Yes Historical Provider, MD  aspirin EC  81 MG tablet Take 81 mg by mouth every other day.   Yes Historical Provider, MD  dorzolamide (TRUSOPT) 2 % ophthalmic solution Place 1 drop into the left eye 2 (two) times daily.   Yes Historical Provider, MD  ibuprofen (ADVIL,MOTRIN) 200 MG tablet Take 200 mg by mouth every 6 (six) hours as needed. For pain   Yes Historical Provider, MD  ipratropium (ATROVENT) 0.06 % nasal spray Place 2 sprays into both nostrils daily. Uses once a day    Yes Historical Provider, MD  latanoprost (XALATAN) 0.005 % ophthalmic solution Place 1 drop into both eyes at bedtime.    Yes Historical Provider, MD  losartan (COZAAR) 50 MG tablet TAKE ONE TABLET BY MOUTH ONCE DAILY 11/07/14  Yes  [name], MD  meclizine (ANTIVERT) 25 MG tablet Take 25 mg by mouth 3 (three) times daily as needed. For dizziness/vertigo   Yes Historical Provider, MD  naproxen sodium (ANAPROX) 220 MG tablet Take 220 mg by mouth as needed (for back pain).   Yes Historical Provider, MD  neomycin-polymyxin-dexamethasone (MAXITROL) 0.1 % ophthalmic suspension 2 drops See admin instructions. Into affected ear canal daily as needed for itchiness   Yes Historical Provider, MD  Nutritional Supplements (JUICE PLUS FIBRE PO) Take 1 tablet by mouth 2 (two) times daily.   Yes Historical Provider, MD  Pumpkin Seed-Soy Germ (AZO BLADDER CONTROL/GO-LESS) CAPS Take 1 capsule by mouth as needed (incontience).   Yes Historical Provider, MD  timolol (BETIMOL) 0.5 % ophthalmic solution Place 1 drop into both eyes every morning.    Yes Historical Provider, MD  UNABLE TO FIND Juice Plus Trio Blend: Orchard/Garden/Vineyard/Omega:   Yes Historical Provider, MD      Allergies  Allergen Reactions  . Codeine Other (See Comments)    Makes the patient feel like she has "cotton mouth" and "weird"; ineffective, also  . Contrast Media [Iodinated Diagnostic Agents] Other (See Comments)    Numbness, burning  . Penicillins Other (See Comments)    "Childhood allergy" Has patient had a PCN reaction causing immediate rash, facial/tongue/throat swelling, SOB or lightheadedness with hypotension: Unk Has patient had a PCN reaction causing severe rash involving mucus membranes or skin necrosis: Unk Has patient had a PCN reaction that required hospitalization: Unk Has patient had a PCN reaction occurring within the last 10 years: No If all of the above answers are "NO", then may proceed with Cephalosporin use.   . Sulfa Drugs Cross Reactors Other (See Comments)    Reaction unknown (allergy is from childhood)  . Chlorhexidine Rash  . Latex Rash    No purple gloves  . Povidone Rash    ROS:  Out of a complete 14 system review of  symptoms, the patient complains only of the following symptoms, and all other reviewed systems are negative.  Numbness Tremor  Blood pressure 121/65, pulse 67, height 5\' 4"  (1.626 m), weight 138 lb 8 oz (62.8 kg).  Physical Exam  General: The patient is alert and cooperative at the time of the examination.  Eyes: Pupils are equal, round, and reactive to light. Discs are flat bilaterally.  Neck: The neck is supple, no carotid bruits are noted.  Respiratory: The respiratory examination is clear.  Cardiovascular: The cardiovascular examination reveals a regular rate and rhythm, no obvious murmurs or rubs are noted.  Skin: Extremities are without significant edema.  Neurologic Exam  Mental status: The patient is alert and oriented x 3 at the time of the examination. The  patient has apparent normal recent and remote memory, with an apparently normal attention span and concentration ability.  Cranial nerves: Facial symmetry is present. There is good sensation of the face to pinprick and soft touch bilaterally. The strength of the facial muscles and the muscles to head turning and shoulder shrug are normal bilaterally. Speech is well enunciated, no aphasia or dysarthria is noted. Extraocular movements are full. Visual fields are full. The tongue is midline, and the patient has symmetric elevation of the soft palate. No obvious hearing deficits are noted. Some masking of the face is seen.  Motor: The motor testing reveals 5 over 5 strength of all 4 extremities. Good symmetric motor tone is noted throughout.  Sensory: Sensory testing is intact to pinprick, soft touch, vibration sensation, and position sense on all 4 extremities. No evidence of extinction is noted.  Coordination: Cerebellar testing reveals good finger-nose-finger and heel-to-shin bilaterally. Very minimal tremors noted with the arms outstretched on the left hand.  Gait and station: Gait is normal. With walking, the patient has  good symmetric arm swing, normal posture. Tandem gait is normal. Romberg is negative. No drift is seen.  Reflexes: Deep tendon reflexes are symmetric and normal bilaterally. Toes are downgoing bilaterally.   CT head 07/16/16:  IMPRESSION: 1. No acute intracranial findings. 2. 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  * CT scan images were reviewed online. I agree with the written report.    Assessment/Plan:  1. Transient left sided numbness and paresthesias  2. Left upper extremity tremor  The patient does have masking of the face on clinical examination, but no other features of parkinsonism. She will follow-up in 6 months to evaluate the tremor. The patient has had occasional episodes of transient left-sided numbness that usually last only about 20 or 30 minutes, but the most recent event lasted for several hours. The patient will be sent for an EEG study. She will remain on aspirin 81 mg daily.  Jill Alexanders MD 07/18/2016 10:27 AM  [facility] [address] [place], [place] [address]  Phone [phone] Fax [phone]

## 2016-07-18 NOTE — Patient Instructions (Signed)
   We will check an EEG study and follow up in 6 months to follow the left arm tremor.

## 2016-07-25 ENCOUNTER — Ambulatory Visit (INDEPENDENT_AMBULATORY_CARE_PROVIDER_SITE_OTHER): Payer: Medicare Other

## 2016-07-25 ENCOUNTER — Encounter: Payer: Medicare Other | Admitting: *Deleted

## 2016-07-25 ENCOUNTER — Telehealth: Payer: Self-pay | Admitting: Cardiology

## 2016-07-25 ENCOUNTER — Telehealth: Payer: Self-pay | Admitting: Neurology

## 2016-07-25 DIAGNOSIS — R202 Paresthesia of skin: Secondary | ICD-10-CM | POA: Diagnosis not present

## 2016-07-25 NOTE — Procedures (Signed)
    History:  Steele Ledonne is a 71 year old patient with episodic events of left-sided numbness that have been occurring since 2016. Stroke evaluation has been unremarkable. The patient may have several episodes a year of the left-sided numbness, usually starting on the face and spreading to the arm and leg. The patient is being evaluated for possible sensory seizures.  This is a routine EEG. No skull defects are noted. Medications include aspirin, Trusopt, Atrovent, Xalatan, meclizine, atenolol, and naproxen.   EEG classification: Normal awake  Description of the recording: The background rhythms of this recording consists of a fairly well modulated medium amplitude alpha rhythm of 8 Hz that is reactive to eye opening and closure. As the record progresses, the patient appears to remain in the waking state throughout the recording. Photic stimulation was performed, resulting in a bilateral and symmetric photic driving response. Hyperventilation was also performed, resulting in a minimal buildup of the background rhythm activities without significant slowing seen. At no time during the recording does there appear to be evidence of spike or spike wave discharges or evidence of focal slowing. EKG monitor shows no evidence of cardiac rhythm abnormalities with a heart rate of 66.  Impression: This is a normal EEG recording in the waking state. No evidence of ictal or interictal discharges are seen.

## 2016-07-25 NOTE — Telephone Encounter (Signed)
Spoke with pt and reminded pt of remote transmission that is due today. Pt verbalized understanding.   

## 2016-07-25 NOTE — Telephone Encounter (Signed)
I called the patient.  The EEG study was normal. 

## 2016-07-26 ENCOUNTER — Encounter: Payer: Self-pay | Admitting: Cardiology

## 2016-08-13 DIAGNOSIS — H401132 Primary open-angle glaucoma, bilateral, moderate stage: Secondary | ICD-10-CM | POA: Diagnosis not present

## 2016-08-13 DIAGNOSIS — H2511 Age-related nuclear cataract, right eye: Secondary | ICD-10-CM | POA: Diagnosis not present

## 2016-08-13 DIAGNOSIS — Z961 Presence of intraocular lens: Secondary | ICD-10-CM | POA: Diagnosis not present

## 2016-09-30 DIAGNOSIS — Z6824 Body mass index (BMI) 24.0-24.9, adult: Secondary | ICD-10-CM | POA: Diagnosis not present

## 2016-09-30 DIAGNOSIS — I638 Other cerebral infarction: Secondary | ICD-10-CM | POA: Diagnosis not present

## 2016-09-30 DIAGNOSIS — M859 Disorder of bone density and structure, unspecified: Secondary | ICD-10-CM | POA: Diagnosis not present

## 2016-09-30 DIAGNOSIS — F418 Other specified anxiety disorders: Secondary | ICD-10-CM | POA: Diagnosis not present

## 2016-09-30 DIAGNOSIS — I10 Essential (primary) hypertension: Secondary | ICD-10-CM | POA: Diagnosis not present

## 2016-09-30 DIAGNOSIS — E119 Type 2 diabetes mellitus without complications: Secondary | ICD-10-CM | POA: Diagnosis not present

## 2016-09-30 DIAGNOSIS — Z950 Presence of cardiac pacemaker: Secondary | ICD-10-CM | POA: Diagnosis not present

## 2016-09-30 DIAGNOSIS — Z1389 Encounter for screening for other disorder: Secondary | ICD-10-CM | POA: Diagnosis not present
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
## 2016-11-04 DIAGNOSIS — E119 Type 2 diabetes mellitus without complications: Secondary | ICD-10-CM | POA: Diagnosis not present

## 2016-11-04 DIAGNOSIS — K921 Melena: Secondary | ICD-10-CM | POA: Diagnosis not present

## 2016-11-04 DIAGNOSIS — I10 Essential (primary) hypertension: Secondary | ICD-10-CM | POA: Diagnosis not present

## 2016-11-04 DIAGNOSIS — R112 Nausea with vomiting, unspecified: Secondary | ICD-10-CM | POA: Diagnosis not present

## 2016-11-04 DIAGNOSIS — K625 Hemorrhage of anus and rectum: Secondary | ICD-10-CM | POA: Diagnosis not present

## 2016-11-04 DIAGNOSIS — Z6823 Body mass index (BMI) 23.0-23.9, adult: Secondary | ICD-10-CM | POA: Diagnosis not present

## 2016-11-05 DIAGNOSIS — H409 Unspecified glaucoma: Secondary | ICD-10-CM | POA: Diagnosis not present

## 2016-11-05 DIAGNOSIS — H2511 Age-related nuclear cataract, right eye: Secondary | ICD-10-CM | POA: Diagnosis not present

## 2016-11-05 DIAGNOSIS — H401132 Primary open-angle glaucoma, bilateral, moderate stage: Secondary | ICD-10-CM | POA: Diagnosis not present

## 2016-11-05 DIAGNOSIS — Z961 Presence of intraocular lens: Secondary | ICD-10-CM | POA: Diagnosis not present

## 2016-11-13 DIAGNOSIS — K625 Hemorrhage of anus and rectum: Secondary | ICD-10-CM | POA: Diagnosis not present

## 2016-11-20 DIAGNOSIS — M546 Pain in thoracic spine: Secondary | ICD-10-CM | POA: Diagnosis not present

## 2016-11-20 DIAGNOSIS — M9903 Segmental and somatic dysfunction of lumbar region: Secondary | ICD-10-CM | POA: Diagnosis not present

## 2016-11-20 DIAGNOSIS — M545 Low back pain: Secondary | ICD-10-CM | POA: Diagnosis not present

## 2016-11-20 DIAGNOSIS — M9902 Segmental and somatic dysfunction of thoracic region: Secondary | ICD-10-CM | POA: Diagnosis not present

## 2016-11-26 DIAGNOSIS — Z961 Presence of intraocular lens: Secondary | ICD-10-CM | POA: Diagnosis not present

## 2016-11-26 DIAGNOSIS — H2511 Age-related nuclear cataract, right eye: Secondary | ICD-10-CM | POA: Diagnosis not present

## 2016-11-26 DIAGNOSIS — H401132 Primary open-angle glaucoma, bilateral, moderate stage: Secondary | ICD-10-CM | POA: Diagnosis not present

## 2016-11-27 DIAGNOSIS — M545 Low back pain: Secondary | ICD-10-CM | POA: Diagnosis not present

## 2016-11-27 DIAGNOSIS — M9902 Segmental and somatic dysfunction of thoracic region: Secondary | ICD-10-CM | POA: Diagnosis not present

## 2016-11-27 DIAGNOSIS — M546 Pain in thoracic spine: Secondary | ICD-10-CM | POA: Diagnosis not present

## 2016-11-27 DIAGNOSIS — M9903 Segmental and somatic dysfunction of lumbar region: Secondary | ICD-10-CM | POA: Diagnosis not present

## 2016-12-09 DIAGNOSIS — M5136 Other intervertebral disc degeneration, lumbar region: Secondary | ICD-10-CM | POA: Diagnosis not present

## 2016-12-09 DIAGNOSIS — M791 Myalgia: Secondary | ICD-10-CM | POA: Diagnosis not present

## 2016-12-11 ENCOUNTER — Ambulatory Visit (INDEPENDENT_AMBULATORY_CARE_PROVIDER_SITE_OTHER): Payer: Medicare Other | Admitting: Podiatry

## 2016-12-11 ENCOUNTER — Telehealth: Payer: Self-pay | Admitting: Internal Medicine

## 2016-12-11 ENCOUNTER — Encounter: Payer: Self-pay | Admitting: Podiatry

## 2016-12-11 VITALS — Ht 64.0 in | Wt 137.0 lb

## 2016-12-11 DIAGNOSIS — M79673 Pain in unspecified foot: Secondary | ICD-10-CM

## 2016-12-11 DIAGNOSIS — L853 Xerosis cutis: Secondary | ICD-10-CM | POA: Diagnosis not present

## 2016-12-11 DIAGNOSIS — B351 Tinea unguium: Secondary | ICD-10-CM | POA: Diagnosis not present

## 2016-12-11 NOTE — Progress Notes (Signed)
SUBJECTIVE: 71 y.o. year old female presents complaining of problematic toe nails and dry scaly skin on both heels.  REVIEW OF SYSTEMS: Pertinent items noted in HPI and remainder of comprehensive ROS otherwise negative.  OBJECTIVE: DERMATOLOGIC EXAMINATION: Nails: Thick uneven nail plate right 1st and 5th.  Dry scaly skin plantar posterior bilateral.  VASCULAR EXAMINATION OF LOWER LIMBS: All pedal pulses are palpable with normal pulsation.  Capillary Filling times within 3 seconds in all digits.  No edema or erythema noted. Temperature gradient from tibial crest to dorsum of foot is within normal bilateral.  NEUROLOGIC EXAMINATION OF THE LOWER LIMBS: Achilles DTR is present and within normal. Monofilament (Semmes-Weinstein 10-gm) sensory testing positive 6 out of 6, bilateral. Vibratory sensations(128Hz  turning fork) intact at medial and lateral forefoot bilateral.  Sharp and Dull discriminatory sensations at the plantar ball of hallux is intact bilateral.   MUSCULOSKELETAL EXAMINATION: No gross deformities noted.   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 Xerotic skin both heel.  PLAN: Reviewed clinical findings and available treatment options. All deformed nails debrided. May use scrub pad to remove dry scaly skin plantar posterior heels. Return as needed.

## 2016-12-11 NOTE — Patient Instructions (Addendum)
Foot check. Uneven and broken end part of right great toe nail debrided. As for dry scaly skin, may use scrub pad to remove dry skin after each shower. Return as needed.

## 2016-12-11 NOTE — Telephone Encounter (Signed)
Spoke with pt informed her that she would need to come into the office with the TENS unit and would need to have her monitored while the TENs unit was on in order to make sure the unit did not interfere with the pacemaker. Pt stated that she doesn't have a TENs unit but had heard some people used them for back pain, pt stated that she was going to talk to her orthopedic doctor about it. Informed pt to call back and schedule an appointment if she decides to use a TENs unit. Pt voiced understanding.

## 2016-12-11 NOTE — Telephone Encounter (Signed)
New message   Pt states she has back issues and she heard about a "tens" that is supposed to help with back pain. She wants to know if this will counteract with her pace maker. She requests a call back.

## 2016-12-13 ENCOUNTER — Telehealth: Payer: Self-pay | Admitting: Internal Medicine

## 2016-12-13 NOTE — Telephone Encounter (Signed)
Spoke with pt and gave the number to [facility]. Charlene Barker to verify that the product she bought would not interfere with her device. Pt stated she would call

## 2016-12-13 NOTE — Telephone Encounter (Signed)
Patient calling, states that she brought a full body massage "wrap" and it has heat. Patient states that she has a pacemaker and was instructed to call our office on whether it would be safe to use or not.

## 2016-12-18 DIAGNOSIS — M791 Myalgia: Secondary | ICD-10-CM | POA: Diagnosis not present

## 2016-12-24 DIAGNOSIS — H2511 Age-related nuclear cataract, right eye: Secondary | ICD-10-CM | POA: Diagnosis not present

## 2016-12-24 DIAGNOSIS — Z961 Presence of intraocular lens: Secondary | ICD-10-CM | POA: Diagnosis not present

## 2016-12-24 DIAGNOSIS — H401132 Primary open-angle glaucoma, bilateral, moderate stage: Secondary | ICD-10-CM | POA: Diagnosis not present

## 2016-12-25 DIAGNOSIS — M791 Myalgia: Secondary | ICD-10-CM | POA: Diagnosis not present

## 2017-01-01 DIAGNOSIS — M791 Myalgia: Secondary | ICD-10-CM | POA: Diagnosis not present

## 2017-01-02 DIAGNOSIS — M7989 Other specified soft tissue disorders: Secondary | ICD-10-CM | POA: Diagnosis not present

## 2017-01-02 DIAGNOSIS — Z6823 Body mass index (BMI) 23.0-23.9, adult: Secondary | ICD-10-CM | POA: Diagnosis not present

## 2017-01-07 DIAGNOSIS — M791 Myalgia: Secondary | ICD-10-CM | POA: Diagnosis not present

## 2017-01-10 DIAGNOSIS — M791 Myalgia: Secondary | ICD-10-CM | POA: Diagnosis not present

## 2017-01-10 DIAGNOSIS — M5136 Other intervertebral disc degeneration, lumbar region: Secondary | ICD-10-CM | POA: Diagnosis not present

## 2017-01-10 DIAGNOSIS — M542 Cervicalgia: Secondary | ICD-10-CM | POA: Diagnosis not present

## 2017-01-10 DIAGNOSIS — M50821 Other cervical disc disorders at C4-C5 level: Secondary | ICD-10-CM | POA: Diagnosis not present

## 2017-01-16 DIAGNOSIS — M791 Myalgia: Secondary | ICD-10-CM | POA: Diagnosis not present

## 2017-01-21 DIAGNOSIS — Z961 Presence of intraocular lens: Secondary | ICD-10-CM | POA: Diagnosis not present

## 2017-01-21 DIAGNOSIS — H2511 Age-related nuclear cataract, right eye: Secondary | ICD-10-CM | POA: Diagnosis not present

## 2017-01-21 DIAGNOSIS — H401132 Primary open-angle glaucoma, bilateral, moderate stage: Secondary | ICD-10-CM | POA: Diagnosis not present

## 2017-01-22 DIAGNOSIS — M791 Myalgia: Secondary | ICD-10-CM | POA: Diagnosis not present

## 2017-01-23 ENCOUNTER — Encounter: Payer: Self-pay | Admitting: Neurology

## 2017-01-23 ENCOUNTER — Ambulatory Visit (INDEPENDENT_AMBULATORY_CARE_PROVIDER_SITE_OTHER): Payer: Medicare Other | Admitting: Neurology

## 2017-01-23 VITALS — BP 127/72 | HR 68 | Ht 64.0 in | Wt 137.5 lb

## 2017-01-23 DIAGNOSIS — R202 Paresthesia of skin: Secondary | ICD-10-CM | POA: Diagnosis not present

## 2017-01-23 NOTE — Progress Notes (Signed)
Reason for visit: Left-sided numbness  Charlene Barker is an 71 y.o. female  History of present illness:  Charlene Barker is a 71 year old left-handed white female with a history of episodes of left-sided numbness that may occur 2 or 3 times a year. The patient has had at least 2 episodes since last seen, she oftentimes will note episodes when she has some underlying anxiety. Coming to the doctor's office almost always brings on some numbness on the left face, but it usually does not spread down to the left arm or leg. The patient had an EEG study that was unremarkable. The patient continues to note a slight tremor on the left hand that is most bothersome when she is feeding herself. She denies much tremor with the right arm. Her handwriting has gotten somewhat sloppy. She has slight troubles with balance, she denies any falls. She denies any headaches. She returns to this office for an evaluation.  Past Medical History:  Diagnosis Date  . Anxiety   . Atrioventricular block, complete -intermittent       . Drug-induced hyperkalemia -associated with Aldactone   . GERD (gastroesophageal reflux disease)   . Glaucoma    bilateral  . HTN (hypertension)   . Left bundle branch block   . Lightheadedness    Associated with exercise  . 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 Pacemaker    St Jude  . Paresthesia of both legs 03/02/2015  . Spinal stenosis of lumbar region 03/02/2015   L4-5  . Syncope     Past Surgical History:  Procedure Laterality Date  . BLADDER SURGERY     sling  . EYE SURGERY     several, bilateral  . LOOP RECORDER IMPLANT N/A 09/20/2011   Procedure: LOOP RECORDER IMPLANT;  Surgeon: Deboraha Sprang, MD;  Location: [facility] CATH LAB;  Service: Cardiovascular;  Laterality: N/A;  . PERMANENT PACEMAKER INSERTION N/A 04/15/2012    Family History  Problem Relation Age of Onset  . Leukemia Mother   . Stroke Father   . Heart failure Father   . Heart attack Father   . Heart  disease Father   . Diabetes Sister   . Asthma Brother   . Restless legs syndrome Brother     Social history:  reports that she has never smoked. She has never used smokeless tobacco. She reports that she does not drink alcohol or use drugs.    Allergies  Allergen Reactions  . Codeine Other (See Comments)    Makes the patient feel like she has "cotton mouth" and "weird"; ineffective, also  . Contrast Media [Iodinated Diagnostic Agents] Other (See Comments)    Numbness, burning  . Penicillins Other (See Comments)    "Childhood allergy" Has patient had a PCN reaction causing immediate rash, facial/tongue/throat swelling, SOB or lightheadedness with hypotension: Unk Has patient had a PCN reaction causing severe rash involving mucus membranes or skin necrosis: Unk Has patient had a PCN reaction that required hospitalization: Unk Has patient had a PCN reaction occurring within the last 10 years: No If all of the above answers are "NO", then may proceed with Cephalosporin use.   . Sulfa Drugs Cross Reactors Other (See Comments)    Reaction unknown (allergy is from childhood)  . Chlorhexidine Rash  . Latex Rash    No purple gloves  . Povidone Rash    Medications:  Prior to Admission medications   Medication Sig Start Date End Date Taking? Authorizing Provider  acetaminophen (TYLENOL) 650 MG CR tablet Take 650 mg by mouth daily as needed (for headaches, discomfort or back pain).   Yes [provider]  aspirin EC 81 MG tablet Take 81 mg by mouth every other day.   Yes [provider]  dorzolamide (TRUSOPT) 2 % ophthalmic solution Place 1 drop into the left eye 2 (two) times daily.   Yes [provider]  ipratropium (ATROVENT) 0.06 % nasal spray Place 2 sprays into both nostrils daily as needed. Uses once a day    Yes [provider]  irbesartan (AVAPRO) 150 MG tablet Take 150 mg by mouth daily. 01/17/17  Yes [provider]  latanoprost  (XALATAN) 0.005 % ophthalmic solution Place 1 drop into both eyes at bedtime.    Yes [provider]  meclizine (ANTIVERT) 25 MG tablet Take 25 mg by mouth 3 (three) times daily as needed. For dizziness/vertigo   Yes [provider]  naproxen sodium (ANAPROX) 220 MG tablet Take 220 mg by mouth as needed (for back pain).   Yes [provider]  neomycin-polymyxin-dexamethasone (MAXITROL) 0.1 % ophthalmic suspension 2 drops See admin instructions. Into affected ear canal daily as needed for itchiness   Yes [provider]  Nutritional Supplements (JUICE PLUS FIBRE PO) Take 1 tablet by mouth 2 (two) times daily.   Yes [provider]  Pumpkin Seed-Soy Germ (AZO BLADDER CONTROL/GO-LESS) CAPS Take 1 capsule by mouth as needed (incontience).   Yes [provider]  timolol (BETIMOL) 0.5 % ophthalmic solution Place 1 drop into both eyes every morning.    Yes [provider]  UNABLE TO FIND Juice Plus Trio Blend: Orchard/Garden/Vineyard/Omega:   Yes [provider]    ROS:  Out of a complete 14 system review of symptoms, the patient complains only of the following symptoms, and all other reviewed systems are negative.  Ear pain on the right Dizziness Facial itching  Blood pressure 127/72, pulse 68, height 5\' 4"  (1.626 m), weight 137 lb 8 oz (62.4 kg).  Physical Exam  General: The patient is alert and cooperative at the time of the examination.  Ears: Tympanic membranes are clear bilaterally.  Skin: No significant peripheral edema is noted.   Neurologic Exam  Mental status: The patient is alert and oriented x 3 at the time of the examination. The patient has apparent normal recent and remote memory, with an apparently normal attention span and concentration ability.   Cranial nerves: Facial symmetry is present. Speech is normal, no aphasia or dysarthria is noted. Extraocular movements are full. Visual fields are full. Mild  masking of the face is seen.  Motor: The patient has good strength in all 4 extremities.  Sensory examination: Soft touch sensation is symmetric on the face, arms, and legs.  Coordination: The patient has good finger-nose-finger and heel-to-shin bilaterally. With arms outstretched, there is a very minimal tremor in the left hand. When drawing a spiral, there is minimal tremor translated into handwriting.  Gait and station: The patient has a normal gait, the patient has good arm swing bilaterally. Tandem gait is normal. Romberg is negative. No drift is seen.  Reflexes: Deep tendon reflexes are symmetric.   Assessment/Plan:  1. Episodic left-sided numbness  2. Probable essential tremor  The patient does have a minimal tremor involving the left arm. The patient does have some masking of the face, but no other features of Parkinson's disease. The patient likely has an essential tremor. If this worsens over  time, the patient will contact our office. The patient has ongoing mild episodes of left-sided numbness that often times are associated with anxiety. A cerebrovascular workup previously was unremarkable. At this point, the patient will follow-up through this office on an as-needed basis.  Jill Alexanders MD 01/23/2017 11:09 AM  [facility] [address] [place], [place] [address]  Phone [phone] Fax [phone]

## 2017-01-29 DIAGNOSIS — M7918 Myalgia, other site: Secondary | ICD-10-CM | POA: Diagnosis not present

## 2017-03-24 DIAGNOSIS — Z1231 Encounter for screening mammogram for malignant neoplasm of breast: Secondary | ICD-10-CM | POA: Diagnosis not present

## 2017-03-25 DIAGNOSIS — H401132 Primary open-angle glaucoma, bilateral, moderate stage: Secondary | ICD-10-CM | POA: Diagnosis not present

## 2017-03-26 DIAGNOSIS — E119 Type 2 diabetes mellitus without complications: Secondary | ICD-10-CM | POA: Diagnosis not present

## 2017-04-11 ENCOUNTER — Encounter (HOSPITAL_COMMUNITY): Payer: Self-pay | Admitting: Emergency Medicine

## 2017-04-11 ENCOUNTER — Ambulatory Visit (HOSPITAL_COMMUNITY)
Admission: EM | Admit: 2017-04-11 | Discharge: 2017-04-11 | Disposition: A | Discharge status: Home / Self Care | Payer: Medicare Other | Attending: Internal Medicine | Admitting: Internal Medicine

## 2017-04-11 DIAGNOSIS — S161XXA Strain of muscle, fascia and tendon at neck level, initial encounter: Secondary | ICD-10-CM

## 2017-04-11 DIAGNOSIS — M79644 Pain in right finger(s): Secondary | ICD-10-CM | POA: Diagnosis not present
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
## 2017-05-16 DIAGNOSIS — R05 Cough: Secondary | ICD-10-CM | POA: Diagnosis not present

## 2017-05-16 DIAGNOSIS — R6883 Chills (without fever): Secondary | ICD-10-CM | POA: Diagnosis not present

## 2017-05-16 DIAGNOSIS — M542 Cervicalgia: Secondary | ICD-10-CM | POA: Diagnosis not present

## 2017-05-16 DIAGNOSIS — Z6823 Body mass index (BMI) 23.0-23.9, adult: Secondary | ICD-10-CM | POA: Diagnosis not present

## 2017-05-16 DIAGNOSIS — J019 Acute sinusitis, unspecified: Secondary | ICD-10-CM | POA: Diagnosis not present

## 2017-05-29 DIAGNOSIS — M50321 Other cervical disc degeneration at C4-C5 level: Secondary | ICD-10-CM | POA: Diagnosis not present

## 2017-05-29 DIAGNOSIS — G4489 Other headache syndrome: Secondary | ICD-10-CM | POA: Diagnosis not present

## 2017-06-19 ENCOUNTER — Ambulatory Visit: Payer: Self-pay | Admitting: Podiatry

## 2017-06-20 DIAGNOSIS — M5030 Other cervical disc degeneration, unspecified cervical region: Secondary | ICD-10-CM | POA: Diagnosis not present

## 2017-06-23 DIAGNOSIS — E7849 Other hyperlipidemia: Secondary | ICD-10-CM | POA: Diagnosis not present

## 2017-06-23 DIAGNOSIS — E119 Type 2 diabetes mellitus without complications: Secondary | ICD-10-CM | POA: Diagnosis not present

## 2017-06-23 DIAGNOSIS — M859 Disorder of bone density and structure, unspecified: Secondary | ICD-10-CM | POA: Diagnosis not present

## 2017-06-23 DIAGNOSIS — R82998 Other abnormal findings in urine: Secondary | ICD-10-CM | POA: Diagnosis not present

## 2017-06-24 DIAGNOSIS — Z961 Presence of intraocular lens: Secondary | ICD-10-CM | POA: Diagnosis not present

## 2017-06-24 DIAGNOSIS — H401132 Primary open-angle glaucoma, bilateral, moderate stage: Secondary | ICD-10-CM | POA: Diagnosis not present

## 2017-06-24 DIAGNOSIS — H2511 Age-related nuclear cataract, right eye: Secondary | ICD-10-CM | POA: Diagnosis not present

## 2017-06-30 DIAGNOSIS — F418 Other specified anxiety disorders: Secondary | ICD-10-CM | POA: Diagnosis not present

## 2017-06-30 DIAGNOSIS — E119 Type 2 diabetes mellitus without complications: Secondary | ICD-10-CM | POA: Diagnosis not present

## 2017-06-30 DIAGNOSIS — M7989 Other specified soft tissue disorders: Secondary | ICD-10-CM | POA: Diagnosis not present

## 2017-06-30 DIAGNOSIS — M859 Disorder of bone density and structure, unspecified: Secondary | ICD-10-CM | POA: Diagnosis not present

## 2017-06-30 DIAGNOSIS — I6389 Other cerebral infarction: Secondary | ICD-10-CM | POA: Diagnosis not present

## 2017-06-30 DIAGNOSIS — Z1389 Encounter for screening for other disorder: Secondary | ICD-10-CM | POA: Diagnosis not present

## 2017-06-30 DIAGNOSIS — E7849 Other hyperlipidemia: Secondary | ICD-10-CM | POA: Diagnosis not present

## 2017-06-30 DIAGNOSIS — M542 Cervicalgia: Secondary | ICD-10-CM | POA: Diagnosis not present

## 2017-06-30 DIAGNOSIS — Z0000 Encounter for general adult medical examination without abnormal findings: Secondary | ICD-10-CM | POA: Diagnosis not present

## 2017-06-30 DIAGNOSIS — Z950 Presence of cardiac pacemaker: Secondary | ICD-10-CM | POA: Diagnosis not present

## 2017-06-30 DIAGNOSIS — G458 Other transient cerebral ischemic attacks and related syndromes: Secondary | ICD-10-CM | POA: Diagnosis not present

## 2017-06-30 DIAGNOSIS — Z6824 Body mass index (BMI) 24.0-24.9, adult: Secondary | ICD-10-CM | POA: Diagnosis not present

## 2017-07-02 DIAGNOSIS — Z1212 Encounter for screening for malignant neoplasm of rectum: Secondary | ICD-10-CM | POA: Diagnosis not present

## 2017-07-02 DIAGNOSIS — M5030 Other cervical disc degeneration, unspecified cervical region: Secondary | ICD-10-CM | POA: Diagnosis not present

## 2017-07-09 DIAGNOSIS — M5030 Other cervical disc degeneration, unspecified cervical region: Secondary | ICD-10-CM | POA: Diagnosis not present

## 2017-07-11 DIAGNOSIS — M5030 Other cervical disc degeneration, unspecified cervical region: Secondary | ICD-10-CM | POA: Diagnosis not present

## 2017-07-18 DIAGNOSIS — M5030 Other cervical disc degeneration, unspecified cervical region: Secondary | ICD-10-CM | POA: Diagnosis not present

## 2017-07-23 DIAGNOSIS — M5030 Other cervical disc degeneration, unspecified cervical region: Secondary | ICD-10-CM | POA: Diagnosis not present

## 2017-07-25 ENCOUNTER — Telehealth: Payer: Self-pay | Admitting: Internal Medicine

## 2017-07-25 DIAGNOSIS — M5030 Other cervical disc degeneration, unspecified cervical region: Secondary | ICD-10-CM | POA: Diagnosis not present

## 2017-07-25 NOTE — Telephone Encounter (Signed)
New Message   Pt c/o of Chest Pain: STAT if CP now or developed within 24 hours  1. Are you having CP right now? no  2. Are you experiencing any other symptoms (ex. SOB, nausea, vomiting, sweating)? No symptoms   3. How long have you been experiencing CP? On and off for about 3 weeks   4. Is your CP continuous or coming and going? Coming and going  5. Have you taken Nitroglycerin? No   ?
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
## 2017-07-28 DIAGNOSIS — M5030 Other cervical disc degeneration, unspecified cervical region: Secondary | ICD-10-CM | POA: Diagnosis not present

## 2017-07-31 DIAGNOSIS — M5030 Other cervical disc degeneration, unspecified cervical region: Secondary | ICD-10-CM | POA: Diagnosis not present

## 2017-08-05 ENCOUNTER — Encounter: Payer: Medicare Other | Admitting: *Deleted

## 2017-08-05 ENCOUNTER — Telehealth: Payer: Self-pay | Admitting: Cardiology

## 2017-08-05 NOTE — Telephone Encounter (Signed)
LMOVM reminding pt to send remote transmission.   

## 2017-08-07 ENCOUNTER — Encounter: Payer: Self-pay | Admitting: Cardiology

## 2017-08-08 DIAGNOSIS — M5030 Other cervical disc degeneration, unspecified cervical region: Secondary | ICD-10-CM | POA: Diagnosis not present

## 2017-08-11 DIAGNOSIS — M5030 Other cervical disc degeneration, unspecified cervical region: Secondary | ICD-10-CM | POA: Diagnosis not present

## 2017-08-12 DIAGNOSIS — H401132 Primary open-angle glaucoma, bilateral, moderate stage: Secondary | ICD-10-CM | POA: Diagnosis not present

## 2017-08-12 DIAGNOSIS — Z961 Presence of intraocular lens: Secondary | ICD-10-CM | POA: Diagnosis not present

## 2017-08-13 ENCOUNTER — Encounter: Payer: Self-pay | Admitting: Internal Medicine

## 2017-08-19 DIAGNOSIS — M5030 Other cervical disc degeneration, unspecified cervical region: Secondary | ICD-10-CM | POA: Diagnosis not present

## 2017-08-20 ENCOUNTER — Ambulatory Visit (INDEPENDENT_AMBULATORY_CARE_PROVIDER_SITE_OTHER): Payer: Medicare Other | Admitting: *Deleted

## 2017-08-20 DIAGNOSIS — I442 Atrioventricular block, complete: Secondary | ICD-10-CM

## 2017-08-21 DIAGNOSIS — M5030 Other cervical disc degeneration, unspecified cervical region: Secondary | ICD-10-CM | POA: Diagnosis not present

## 2017-08-22 ENCOUNTER — Encounter: Payer: Self-pay | Admitting: Cardiology

## 2017-08-22 NOTE — Progress Notes (Signed)
Remote pacemaker transmission.   

## 2017-08-25 ENCOUNTER — Encounter: Payer: Self-pay | Admitting: Internal Medicine

## 2017-08-25 ENCOUNTER — Ambulatory Visit (INDEPENDENT_AMBULATORY_CARE_PROVIDER_SITE_OTHER): Payer: Medicare Other | Admitting: Internal Medicine

## 2017-08-25 VITALS — BP 150/72 | HR 74 | Ht 64.0 in | Wt 134.0 lb

## 2017-08-25 DIAGNOSIS — I442 Atrioventricular block, complete: Secondary | ICD-10-CM | POA: Diagnosis not present

## 2017-08-25 DIAGNOSIS — Z950 Presence of cardiac pacemaker: Secondary | ICD-10-CM

## 2017-08-25 DIAGNOSIS — M5030 Other cervical disc degeneration, unspecified cervical region: Secondary | ICD-10-CM | POA: Diagnosis not present

## 2017-08-25 DIAGNOSIS — R55 Syncope and collapse: Secondary | ICD-10-CM | POA: Diagnosis not present

## 2017-08-25 LAB — CUP PACEART INCLINIC DEVICE CHECK
Battery Remaining Longevity: 121 mo
Battery Voltage: 2.93 V
Brady Statistic RA Percent Paced: 1.5 %
Brady Statistic RV Percent Paced: 0.49 %
Date Time Interrogation Session: 20190429174245
Implantable Lead Implant Date: 20131218
Implantable Lead Implant Date: 20131218
Implantable Lead Location: 753859
Implantable Lead Location: 753860
Implantable Lead Model: 1944
Implantable Pulse Generator Implant Date: 20131218
Lead Channel Impedance Value: 387.5 Ohm
Lead Channel Impedance Value: 737.5 Ohm
Lead Channel Pacing Threshold Amplitude: 0.5 V
Lead Channel Pacing Threshold Amplitude: 0.5 V
Lead Channel Pacing Threshold Pulse Width: 0.5 ms
Lead Channel Pacing Threshold Pulse Width: 0.5 ms
Lead Channel Sensing Intrinsic Amplitude: 12 mV
Lead Channel Sensing Intrinsic Amplitude: 4.7 mV
Lead Channel Setting Pacing Amplitude: 2 V
Lead Channel Setting Pacing Amplitude: 2.5 V
Lead Channel Setting Pacing Pulse Width: 0.5 ms
Lead Channel Setting Sensing Sensitivity: 2 mV
Pulse Gen Model: 2110
Pulse Gen Serial Number: 7423192
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
## 2017-08-27 DIAGNOSIS — M5030 Other cervical disc degeneration, unspecified cervical region: Secondary | ICD-10-CM | POA: Diagnosis not present

## 2017-08-28 LAB — CUP PACEART REMOTE DEVICE CHECK
Battery Remaining Longevity: 107 mo
Battery Remaining Percentage: 81 %
Battery Voltage: 2.93 V
Brady Statistic AP VP Percent: 1 %
Brady Statistic AP VS Percent: 1.6 %
Brady Statistic AS VP Percent: 1 %
Brady Statistic AS VS Percent: 98 %
Brady Statistic RA Percent Paced: 1.6 %
Brady Statistic RV Percent Paced: 1 %
Date Time Interrogation Session: 20190424212045
Implantable Lead Implant Date: 20131218
Implantable Lead Implant Date: 20131218
Implantable Lead Location: 753859
Implantable Lead Location: 753860
Implantable Lead Model: 1944
Implantable Pulse Generator Implant Date: 20131218
Lead Channel Impedance Value: 400 Ohm
Lead Channel Impedance Value: 740 Ohm
Lead Channel Pacing Threshold Amplitude: 0.5 V
Lead Channel Pacing Threshold Amplitude: 0.5 V
Lead Channel Pacing Threshold Pulse Width: 0.5 ms
Lead Channel Pacing Threshold Pulse Width: 0.5 ms
Lead Channel Sensing Intrinsic Amplitude: 12 mV
Lead Channel Sensing Intrinsic Amplitude: 3.6 mV
Lead Channel Setting Pacing Amplitude: 2 V
Lead Channel Setting Pacing Amplitude: 2.5 V
Lead Channel Setting Pacing Pulse Width: 0.5 ms
Lead Channel Setting Sensing Sensitivity: 2 mV
Pulse Gen Model: 2110
Pulse Gen Serial Number: 7423192

## 2017-09-01 DIAGNOSIS — M5030 Other cervical disc degeneration, unspecified cervical region: Secondary | ICD-10-CM | POA: Diagnosis not present

## 2017-09-05 DIAGNOSIS — M5030 Other cervical disc degeneration, unspecified cervical region: Secondary | ICD-10-CM | POA: Diagnosis not present

## 2017-10-13 DIAGNOSIS — M542 Cervicalgia: Secondary | ICD-10-CM | POA: Diagnosis not present

## 2017-10-13 DIAGNOSIS — M47892 Other spondylosis, cervical region: Secondary | ICD-10-CM | POA: Diagnosis not present

## 2017-10-21 DIAGNOSIS — Z961 Presence of intraocular lens: Secondary | ICD-10-CM | POA: Diagnosis not present

## 2017-10-21 DIAGNOSIS — H401132 Primary open-angle glaucoma, bilateral, moderate stage: Secondary | ICD-10-CM | POA: Diagnosis not present

## 2017-10-21 DIAGNOSIS — H2511 Age-related nuclear cataract, right eye: Secondary | ICD-10-CM | POA: Diagnosis not present

## 2017-11-10 DIAGNOSIS — M50321 Other cervical disc degeneration at C4-C5 level: Secondary | ICD-10-CM | POA: Diagnosis not present

## 2017-11-14 ENCOUNTER — Telehealth: Payer: Self-pay | Admitting: Internal Medicine

## 2017-11-14 DIAGNOSIS — I10 Essential (primary) hypertension: Secondary | ICD-10-CM | POA: Diagnosis not present

## 2017-11-14 DIAGNOSIS — R945 Abnormal results of liver function studies: Secondary | ICD-10-CM | POA: Diagnosis not present

## 2017-11-14 DIAGNOSIS — I6389 Other cerebral infarction: Secondary | ICD-10-CM | POA: Diagnosis not present

## 2017-11-14 DIAGNOSIS — E1169 Type 2 diabetes mellitus with other specified complication: Secondary | ICD-10-CM | POA: Diagnosis not present

## 2017-11-14 DIAGNOSIS — Z6823 Body mass index (BMI) 23.0-23.9, adult: Secondary | ICD-10-CM | POA: Diagnosis not present

## 2017-11-14 DIAGNOSIS — Z950 Presence of cardiac pacemaker: Secondary | ICD-10-CM | POA: Diagnosis not present

## 2017-11-14 NOTE — Telephone Encounter (Signed)
Spoke with pt informed her that I had reviewed the transmission that her pacemaker was functioning normally and that there had not been any recorded episodes of ventricular high rates or atrial high rates that might be the cause of her dizziness. Pt stated that she was at her PCP office this morning and mentioned it to him per pt he suggested she take Meclizine, pt stated that she had taken the Meclizine but has not had any relief. Advised pt to record her daily activities, when she takes her meds and her BP in relation to her dizzy spells to see if there is a pattern to them. Pt voiced understanding and appreciative of call back.

## 2017-11-14 NOTE — Telephone Encounter (Signed)
Patient walked into the clinic today before a return call could be made. Pt c/o feeling dizzy and "wanting to pass out." She also wants her device checked. I advised patient to seek care at the ED or at the least Urgent care. She adamantly declined saying "I won't go to [facility]. That is not an option." I suggested she find a smaller ED like [facility] or the stand alone ED in [place] at [place]. She declined again. I stated we cannot accommodate walk in patients and we had no patient appointment slots open for the day. I suggested she send in a remote transmission for the device clinic to take a look. She agreed and went home.

## 2017-11-14 NOTE — Telephone Encounter (Signed)
New Message   STAT if patient feels like he/she is going to faint   1) Are you dizzy now? yes  2) Do you feel faint or have you passed out? This morning felt faint  3) Do you have any other symptoms? no  4) Have you checked your HR and BP (record if available)? Yes but cant remember numbers

## 2017-11-19 ENCOUNTER — Ambulatory Visit (INDEPENDENT_AMBULATORY_CARE_PROVIDER_SITE_OTHER): Payer: Medicare Other | Admitting: *Deleted

## 2017-11-19 DIAGNOSIS — I442 Atrioventricular block, complete: Secondary | ICD-10-CM | POA: Diagnosis not present

## 2017-11-20 ENCOUNTER — Encounter: Payer: Self-pay | Admitting: Cardiology

## 2017-11-20 NOTE — Progress Notes (Signed)
Remote pacemaker transmission.   

## 2017-11-26 DIAGNOSIS — M50321 Other cervical disc degeneration at C4-C5 level: Secondary | ICD-10-CM | POA: Diagnosis not present

## 2017-11-26 DIAGNOSIS — G4489 Other headache syndrome: Secondary | ICD-10-CM | POA: Diagnosis not present

## 2017-12-23 DIAGNOSIS — H401132 Primary open-angle glaucoma, bilateral, moderate stage: Secondary | ICD-10-CM | POA: Diagnosis not present

## 2017-12-23 DIAGNOSIS — Z961 Presence of intraocular lens: Secondary | ICD-10-CM | POA: Diagnosis not present

## 2017-12-23 DIAGNOSIS — H2511 Age-related nuclear cataract, right eye: Secondary | ICD-10-CM | POA: Diagnosis not present

## 2017-12-29 LAB — CUP PACEART REMOTE DEVICE CHECK
Date Time Interrogation Session: 20190902121501
Implantable Lead Implant Date: 20131218
Implantable Lead Implant Date: 20131218
Implantable Lead Location: 753859
Implantable Lead Location: 753860
Implantable Lead Model: 1944
Implantable Pulse Generator Implant Date: 20131218
Pulse Gen Model: 2110
Pulse Gen Serial Number: 7423192

## 2017-12-30 DIAGNOSIS — Z961 Presence of intraocular lens: Secondary | ICD-10-CM | POA: Diagnosis not present

## 2017-12-30 DIAGNOSIS — H401132 Primary open-angle glaucoma, bilateral, moderate stage: Secondary | ICD-10-CM | POA: Diagnosis not present

## 2017-12-30 DIAGNOSIS — H2511 Age-related nuclear cataract, right eye: Secondary | ICD-10-CM | POA: Diagnosis not present

## 2018-02-12 DIAGNOSIS — R42 Dizziness and giddiness: Secondary | ICD-10-CM | POA: Diagnosis not present

## 2018-02-12 DIAGNOSIS — I442 Atrioventricular block, complete: Secondary | ICD-10-CM | POA: Diagnosis not present

## 2018-02-12 DIAGNOSIS — Z6823 Body mass index (BMI) 23.0-23.9, adult: Secondary | ICD-10-CM | POA: Diagnosis not present

## 2018-02-12 DIAGNOSIS — I10 Essential (primary) hypertension: Secondary | ICD-10-CM | POA: Diagnosis not present

## 2018-02-18 ENCOUNTER — Encounter: Payer: Medicare Other | Admitting: *Deleted

## 2018-02-18 ENCOUNTER — Telehealth: Payer: Self-pay

## 2018-02-18 NOTE — Telephone Encounter (Signed)
LMOVM reminding pt to send remote transmission.   

## 2018-02-19 ENCOUNTER — Encounter: Payer: Self-pay | Admitting: Cardiology

## 2018-02-20 ENCOUNTER — Ambulatory Visit: Payer: Medicare Other | Attending: Adult Health | Admitting: Physical Therapy

## 2018-02-20 ENCOUNTER — Other Ambulatory Visit: Payer: Self-pay

## 2018-02-20 VITALS — BP 146/81 | HR 72

## 2018-02-20 DIAGNOSIS — R42 Dizziness and giddiness: Secondary | ICD-10-CM | POA: Insufficient documentation
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
## 2018-02-24 DIAGNOSIS — Z961 Presence of intraocular lens: Secondary | ICD-10-CM | POA: Diagnosis not present

## 2018-02-24 DIAGNOSIS — H401132 Primary open-angle glaucoma, bilateral, moderate stage: Secondary | ICD-10-CM | POA: Diagnosis not present

## 2018-02-24 DIAGNOSIS — H2511 Age-related nuclear cataract, right eye: Secondary | ICD-10-CM | POA: Diagnosis not present

## 2018-02-24 DIAGNOSIS — H409 Unspecified glaucoma: Secondary | ICD-10-CM | POA: Diagnosis not present

## 2018-03-04 ENCOUNTER — Ambulatory Visit (INDEPENDENT_AMBULATORY_CARE_PROVIDER_SITE_OTHER): Payer: Medicare Other | Admitting: *Deleted

## 2018-03-04 DIAGNOSIS — I442 Atrioventricular block, complete: Secondary | ICD-10-CM

## 2018-03-05 NOTE — Progress Notes (Signed)
Remote pacemaker transmission.   

## 2018-03-08 ENCOUNTER — Encounter: Payer: Self-pay | Admitting: Cardiology

## 2018-03-18 DIAGNOSIS — H608X3 Other otitis externa, bilateral: Secondary | ICD-10-CM | POA: Diagnosis not present

## 2018-03-18 DIAGNOSIS — H903 Sensorineural hearing loss, bilateral: Secondary | ICD-10-CM | POA: Diagnosis not present

## 2018-03-18 DIAGNOSIS — H838X3 Other specified diseases of inner ear, bilateral: Secondary | ICD-10-CM | POA: Diagnosis not present

## 2018-03-19 DIAGNOSIS — I442 Atrioventricular block, complete: Secondary | ICD-10-CM | POA: Diagnosis not present

## 2018-03-19 DIAGNOSIS — Z6823 Body mass index (BMI) 23.0-23.9, adult: Secondary | ICD-10-CM | POA: Diagnosis not present

## 2018-03-19 DIAGNOSIS — R945 Abnormal results of liver function studies: Secondary | ICD-10-CM | POA: Diagnosis not present

## 2018-03-19 DIAGNOSIS — G458 Other transient cerebral ischemic attacks and related syndromes: Secondary | ICD-10-CM | POA: Diagnosis not present

## 2018-03-19 DIAGNOSIS — F3289 Other specified depressive episodes: Secondary | ICD-10-CM | POA: Diagnosis not present

## 2018-03-19 DIAGNOSIS — R42 Dizziness and giddiness: Secondary | ICD-10-CM | POA: Diagnosis not present

## 2018-03-19 DIAGNOSIS — Z950 Presence of cardiac pacemaker: Secondary | ICD-10-CM | POA: Diagnosis not present

## 2018-03-19 DIAGNOSIS — E1169 Type 2 diabetes mellitus with other specified complication: Secondary | ICD-10-CM | POA: Diagnosis not present

## 2018-03-19 DIAGNOSIS — Z23 Encounter for immunization: Secondary | ICD-10-CM | POA: Diagnosis not present

## 2018-03-25 DIAGNOSIS — R55 Syncope and collapse: Secondary | ICD-10-CM | POA: Diagnosis not present

## 2018-03-25 DIAGNOSIS — Z950 Presence of cardiac pacemaker: Secondary | ICD-10-CM | POA: Diagnosis not present

## 2018-03-25 DIAGNOSIS — I447 Left bundle-branch block, unspecified: Secondary | ICD-10-CM | POA: Diagnosis not present

## 2018-03-25 DIAGNOSIS — I495 Sick sinus syndrome: Secondary | ICD-10-CM | POA: Diagnosis not present

## 2018-03-30 DIAGNOSIS — Z1231 Encounter for screening mammogram for malignant neoplasm of breast: Secondary | ICD-10-CM | POA: Diagnosis not present

## 2018-03-31 DIAGNOSIS — H401132 Primary open-angle glaucoma, bilateral, moderate stage: Secondary | ICD-10-CM | POA: Diagnosis not present

## 2018-03-31 DIAGNOSIS — H2511 Age-related nuclear cataract, right eye: Secondary | ICD-10-CM | POA: Diagnosis not present

## 2018-03-31 DIAGNOSIS — Z961 Presence of intraocular lens: Secondary | ICD-10-CM | POA: Diagnosis not present

## 2018-04-02 DIAGNOSIS — N393 Stress incontinence (female) (male): Secondary | ICD-10-CM | POA: Diagnosis not present

## 2018-04-02 DIAGNOSIS — Z124 Encounter for screening for malignant neoplasm of cervix: Secondary | ICD-10-CM | POA: Diagnosis not present

## 2018-04-02 DIAGNOSIS — N898 Other specified noninflammatory disorders of vagina: Secondary | ICD-10-CM | POA: Diagnosis not present

## 2018-04-02 DIAGNOSIS — R350 Frequency of micturition: Secondary | ICD-10-CM | POA: Diagnosis not present

## 2018-04-30 DIAGNOSIS — H401132 Primary open-angle glaucoma, bilateral, moderate stage: Secondary | ICD-10-CM | POA: Diagnosis not present

## 2018-04-30 DIAGNOSIS — H2511 Age-related nuclear cataract, right eye: Secondary | ICD-10-CM | POA: Diagnosis not present

## 2018-04-30 DIAGNOSIS — Z961 Presence of intraocular lens: Secondary | ICD-10-CM | POA: Diagnosis not present

## 2018-05-03 LAB — CUP PACEART REMOTE DEVICE CHECK
Battery Remaining Longevity: 107 mo
Battery Remaining Percentage: 81 %
Battery Voltage: 2.93 V
Brady Statistic AP VP Percent: 1 %
Brady Statistic AP VS Percent: 4.3 %
Brady Statistic AS VP Percent: 1 %
Brady Statistic AS VS Percent: 95 %
Brady Statistic RA Percent Paced: 4.2 %
Brady Statistic RV Percent Paced: 1 %
Date Time Interrogation Session: 20191106183314
Implantable Lead Implant Date: 20131218
Implantable Lead Implant Date: 20131218
Implantable Lead Location: 753859
Implantable Lead Location: 753860
Implantable Lead Model: 1944
Implantable Pulse Generator Implant Date: 20131218
Lead Channel Impedance Value: 390 Ohm
Lead Channel Impedance Value: 660 Ohm
Lead Channel Pacing Threshold Amplitude: 0.5 V
Lead Channel Pacing Threshold Amplitude: 0.5 V
Lead Channel Pacing Threshold Pulse Width: 0.5 ms
Lead Channel Pacing Threshold Pulse Width: 0.5 ms
Lead Channel Sensing Intrinsic Amplitude: 12 mV
Lead Channel Sensing Intrinsic Amplitude: 3.6 mV
Lead Channel Setting Pacing Amplitude: 2 V
Lead Channel Setting Pacing Amplitude: 2.5 V
Lead Channel Setting Pacing Pulse Width: 0.5 ms
Lead Channel Setting Sensing Sensitivity: 2 mV
Pulse Gen Model: 2110
Pulse Gen Serial Number: 7423192

## 2018-05-04 DIAGNOSIS — M50321 Other cervical disc degeneration at C4-C5 level: Secondary | ICD-10-CM | POA: Diagnosis not present

## 2018-05-04 DIAGNOSIS — G894 Chronic pain syndrome: Secondary | ICD-10-CM | POA: Diagnosis not present

## 2018-05-04 DIAGNOSIS — G4489 Other headache syndrome: Secondary | ICD-10-CM | POA: Diagnosis not present

## 2018-05-07 DIAGNOSIS — L292 Pruritus vulvae: Secondary | ICD-10-CM | POA: Diagnosis not present

## 2018-05-20 ENCOUNTER — Telehealth: Payer: Self-pay | Admitting: Neurology

## 2018-05-20 NOTE — Telephone Encounter (Signed)
This call was taken care of by [facility] in referrals.

## 2018-05-20 NOTE — Telephone Encounter (Signed)
Charlene Barker returned your call and wants a call back please call her mobile number

## 2018-05-27 DIAGNOSIS — R55 Syncope and collapse: Secondary | ICD-10-CM | POA: Diagnosis not present

## 2018-06-03 DIAGNOSIS — L292 Pruritus vulvae: Secondary | ICD-10-CM | POA: Diagnosis not present

## 2018-06-16 DIAGNOSIS — H2511 Age-related nuclear cataract, right eye: Secondary | ICD-10-CM | POA: Diagnosis not present

## 2018-06-16 DIAGNOSIS — Z961 Presence of intraocular lens: Secondary | ICD-10-CM | POA: Diagnosis not present

## 2018-06-16 DIAGNOSIS — H401132 Primary open-angle glaucoma, bilateral, moderate stage: Secondary | ICD-10-CM | POA: Diagnosis not present

## 2018-07-01 ENCOUNTER — Ambulatory Visit: Payer: Self-pay | Admitting: Cardiology

## 2018-07-07 NOTE — Progress Notes (Signed)
Subjective:  Primary Physician:  Crist Infante, MD  Patient ID: Charlene Barker, female    DOB: 1946-04-04, 73 y.o.   MRN: [id number]  Chief Complaint  Patient presents with  . Near Syncope  . Follow-up    57mo    HPI: Charlene Barker  is a 73 y.o. female  with sinus node dysfunction and also high degree AV block and recurrent syncope leading to permanent pacemaker implantation. Her past medical history is significant for hypertension, benign positional vertigo, left bundle branch block, prediabetes mellitus. She also has history of vertigo but this is different from her dizzy spells..  She now presents to reestablish with me, states that she continues to have frequent episodes of dizzy spells and near syncope about 4-5 months ago. Fortunately no recurrence. States pacemaker interrogation did not reveal anything significant.  Past Medical History:  Diagnosis Date  . Anxiety   . Atrioventricular block, complete -intermittent       . Drug-induced hyperkalemia -associated with Aldactone   . GERD (gastroesophageal reflux disease)   . Glaucoma    bilateral  . HTN (hypertension)   . Left bundle branch block   . Lightheadedness    Associated with exercise  . 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 Pacemaker    St Jude  . Paresthesia of both legs 03/02/2015  . Rheumatoid arthritis ([place])   . Spinal stenosis of lumbar region 03/02/2015   L4-5  . Syncope     Past Surgical History:  Procedure Laterality Date  . BLADDER SURGERY     sling  . EYE SURGERY     several, bilateral  . LOOP RECORDER IMPLANT N/A 09/20/2011   Procedure: LOOP RECORDER IMPLANT;  Surgeon: Deboraha Sprang, MD;  Location: [facility] CATH LAB;  Service: Cardiovascular;  Laterality: N/A;  . PERMANENT PACEMAKER INSERTION N/A 04/15/2012    Social History   Socioeconomic History  . Marital status: Married    Spouse name: Jori Moll  . Number of children: 1  . Years of education: 41  . Highest education level: Not on  file  Occupational History  . Occupation: Retired  Scientific laboratory technician  . Financial resource strain: Not on file  . Food insecurity:    Worry: Not on file    Inability: Not on file  . Transportation needs:    Medical: Not on file    Non-medical: Not on file  Tobacco Use  . Smoking status: Never Smoker  . Smokeless tobacco: Never Used  Substance and Sexual Activity  . Alcohol use: Not Currently    Comment: wine only at christmas time  . Drug use: No  . Sexual activity: Not on file  Lifestyle  . Physical activity:    Days per week: Not on file    Minutes per session: Not on file  . Stress: Not on file  Relationships  . Social connections:    Talks on phone: Not on file    Gets together: Not on file    Attends religious service: Not on file    Active member of club or organization: Not on file    Attends meetings of clubs or organizations: Not on file    Relationship status: Not on file  . Intimate partner violence:    Fear of current or ex partner: Not on file    Emotionally abused: Not on file    Physically abused: Not on file    Forced sexual activity: Not on file  Other Topics Concern  . Not on file  Social History Narrative   Lives at home with husband   caffeine use - chocolate mainly      Patient is left handed.     Current Outpatient Medications on File Prior to Visit  Medication Sig Dispense Refill  . acetaminophen (TYLENOL) 650 MG CR tablet Take 650 mg by mouth daily as needed (for headaches, discomfort or back pain).    Marland Kitchen aspirin EC 81 MG tablet Take 81 mg by mouth every other day.    . AZO-CRANBERRY PO Take by mouth daily.    . Cholecalciferol (VITAMIN D3 HIGH POTENCY PO) Take 1,000 mcg by mouth daily.     . Flaxseed Oil OIL by Does not apply route daily.    . Ginkgo Biloba 100 MG CAPS continuous as needed.    Marland Kitchen guaiFENesin (MUCINEX) 600 MG 12 hr tablet Take by mouth continuous as needed.    . hydroxychloroquine (PLAQUENIL) 200 MG tablet Take 400 mg by mouth  daily.    Marland Kitchen ipratropium (ATROVENT) 0.06 % nasal spray Place 2 sprays into both nostrils daily as needed. Uses once a day     . meclizine (ANTIVERT) 25 MG tablet Take 25 mg by mouth 3 (three) times daily as needed. For dizziness/vertigo    . Nutritional Supplements (JUICE PLUS FIBRE PO) Take by mouth 2 (two) times daily.     No current facility-administered medications on file prior to visit.     Review of Systems  Constitutional: Negative for malaise/fatigue and weight loss.  Respiratory: Negative for cough, hemoptysis and shortness of breath.   Cardiovascular: Negative for chest pain, palpitations, claudication and leg swelling.  Gastrointestinal: Negative for abdominal pain, blood in stool, constipation, heartburn and vomiting.  Genitourinary: Negative for dysuria.  Musculoskeletal: Negative for joint pain and myalgias.  Neurological: Negative for dizziness, focal weakness and headaches.  Endo/Heme/Allergies: Does not bruise/bleed easily.  Psychiatric/Behavioral: Negative for depression. The patient is not nervous/anxious.   All other systems reviewed and are negative.      Objective:  Height 5' 4.5" (1.638 m), weight 131 lb (59.4 kg), SpO2 96 %. Body mass index is 22.14 kg/m.   Flowsheets     07/09/18 10:23 AM  07/09/18 11:00 AM  07/09/18 11:01 AM   BP  145/79  147/84  156/85   BP Location  RightArm  RightArm  RightArm   Patient Position  Supine  Sitting  Standing   Cuff Size  Normal  Normal  Normal   Pulse  74  74  77   SpO2  97%  97%  96%   Weight  131lb4.8oz(59.6kg)  131lb(59.4kg)  131lb(59.4kg)   Height  5'4.5"(1.686m)  5'4.5"(1.659m)  5'4.5"(1.636m)   Other Vitals  BMI 22.14 kg/m2  BSA 1.64 m2                        Physical Exam  Constitutional: She appears well-developed and well-nourished. No distress.  HENT:  Head: Atraumatic.  Eyes: Conjunctivae are normal.  Neck: Neck supple. No JVD present. No thyromegaly  present.  Cardiovascular: Normal rate, regular rhythm, normal heart sounds and intact distal pulses. Exam reveals no gallop.  No murmur heard. Pulmonary/Chest: Effort normal and breath sounds normal.  Abdominal: Soft. Bowel sounds are normal.  Musculoskeletal: Normal range of motion.        General: No edema.  Neurological: She is alert.  Skin: Skin is warm and dry.  Psychiatric:  She has a normal mood and affect.    CARDIAC STUDIES:   Echocardiogram 05/27/2018: Left ventricle cavity is normal in size. Mild concentric hypertrophy of the left ventricle. Abnormal septal wall motion due to left bundle branch block or paced rhythm. Calculated EF 50%. Mild (Grade I) mitral regurgitation. Moderate tricuspid regurgitation. Estimated pulmonary artery systolic pressure 22  mmHg. Compared to previous study in 2013, pacemaker, mitral and tricuspid regurgitation are new findings.  Lexiscan stress 02/07/12: Normal perfusion. No ischemia. Normal LVEF.  Assessment & Recommendations:   1.  St. Jude DDD implant date 04/15/12. Model [id number] for complete heart block  2. Complete heart block ([place])  3. Left bundle branch block  4. Anxiety  5. Near syncope.  Recommendation:   Patient was last seen sometime in November 2018, with multiple somatic complaints, very complex presentation with syncope and near syncope, eventually presenting with complete heart block needing permanent pacemaker implantation.  About 2-3 months ago she has had near syncopal spells which are unexplained.  She made an appointment to reestablish with me.  Physical examination is unremarkable.  Her blood pressure is slightly elevated however in view of prior history of low blood pressure and also orthostasis, no changes in the medications were done.  Her pacemaker remotely transmitted appears to be functioning normally as per the record evaluation.  I'll set her up for a in office pacemaker check.  I also suspect she may have  neurodepressive syncope with marked hypotension during these episodes as arrhythmia has not been documented.  Otherwise I'll see her back in 6 months for follow-up.  Recent echocardiogram reveals no significant change in her LV systolic function.  No significant valvular abnormality.  Adrian Prows, MD, [facility] 07/09/2018, 11:08 AM [facility]. [place] Pager: [phone] Office: [phone] If no answer Cell [phone]

## 2018-07-09 ENCOUNTER — Other Ambulatory Visit: Payer: Self-pay

## 2018-07-09 ENCOUNTER — Ambulatory Visit (INDEPENDENT_AMBULATORY_CARE_PROVIDER_SITE_OTHER): Payer: Medicare Other | Admitting: Cardiology

## 2018-07-09 ENCOUNTER — Encounter: Payer: Self-pay | Admitting: Cardiology

## 2018-07-09 ENCOUNTER — Telehealth: Payer: Self-pay | Admitting: Cardiology

## 2018-07-09 VITALS — Ht 64.5 in | Wt 131.0 lb

## 2018-07-09 DIAGNOSIS — F419 Anxiety disorder, unspecified: Secondary | ICD-10-CM

## 2018-07-09 DIAGNOSIS — Z950 Presence of cardiac pacemaker: Secondary | ICD-10-CM | POA: Diagnosis not present

## 2018-07-09 DIAGNOSIS — I442 Atrioventricular block, complete: Secondary | ICD-10-CM

## 2018-07-09 DIAGNOSIS — R55 Syncope and collapse: Secondary | ICD-10-CM | POA: Diagnosis not present

## 2018-07-09 DIAGNOSIS — I447 Left bundle-branch block, unspecified: Secondary | ICD-10-CM

## 2018-07-09 NOTE — Telephone Encounter (Signed)
Needs to transfer pacemaker care to [place]. She does not want to go to [facility]. Came to see me today after 1.5 years. Please request [facility] to transfer [place]

## 2018-07-10 ENCOUNTER — Encounter: Payer: Self-pay | Admitting: Neurology

## 2018-07-10 ENCOUNTER — Ambulatory Visit (INDEPENDENT_AMBULATORY_CARE_PROVIDER_SITE_OTHER): Payer: Medicare Other | Admitting: Neurology

## 2018-07-10 VITALS — BP 142/77 | HR 69 | Ht 64.5 in | Wt 131.3 lb

## 2018-07-10 DIAGNOSIS — M25511 Pain in right shoulder: Secondary | ICD-10-CM | POA: Diagnosis not present

## 2018-07-10 DIAGNOSIS — G8929 Other chronic pain: Secondary | ICD-10-CM | POA: Diagnosis not present

## 2018-07-10 NOTE — Progress Notes (Signed)
Reason for visit: Right shoulder pain, dropping things  Referring physician: Dr. Dione Plover is a 73 y.o. female  History of present illness:  Ms. Cressey is a 73 year old left-handed white female who has been seen and evaluated through this office approximately 2 years ago for episodes of left-sided numbness that were felt to be related to episodes of anxiety.  The patient has done well in this regard over the last couple years, she only has a very occasional event associated with anxiety.  The patient began having some problems with dizziness and vertigo in November 2019, she has had episodes of vertigo for many years, she averages about 1 episode per year, but the spells in November were somewhat more persistent, she had 2 or 3 separate episodes which is unusual for her.  This problem has resolved.  The patient does have some chronic low back pain, she does have some occasional neck pain that has improved over time.  In January 2020, the patient noted some problems with dropping things from her right hand, she claimed that this has gotten better recently.  She reports some right shoulder discomfort that has been fairly persistent for 4 or 5 months.  The patient denies any numbness of the right arm, she is having occasional headaches that may come up from the neck but this is not very often at this point.  The patient does not believe there is any significant weakness of the legs.  She is sent to this office for further evaluation of the right arm issue.  Past Medical History:  Diagnosis Date  . Anxiety   . Atrioventricular block, complete -intermittent       . Drug-induced hyperkalemia -associated with Aldactone   . GERD (gastroesophageal reflux disease)   . Glaucoma    bilateral  . HTN (hypertension)   . Left bundle branch block   . Lightheadedness    Associated with exercise  . 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 Pacemaker    St Jude  . Paresthesia of both legs  03/02/2015  . Rheumatoid arthritis ([place])   . Spinal stenosis of lumbar region 03/02/2015   L4-5  . Syncope     Past Surgical History:  Procedure Laterality Date  . BLADDER SURGERY     sling  . EYE SURGERY     several, bilateral  . LOOP RECORDER IMPLANT N/A 09/20/2011   Procedure: LOOP RECORDER IMPLANT;  Surgeon: Deboraha Sprang, MD;  Location: [facility] CATH LAB;  Service: Cardiovascular;  Laterality: N/A;  . PERMANENT PACEMAKER INSERTION N/A 04/15/2012    Family History  Problem Relation Age of Onset  . Leukemia Mother   . Stroke Father   . Heart failure Father   . Heart attack Father   . Heart disease Father   . Diabetes Sister   . Asthma Brother   . Restless legs syndrome Brother     Social history:  reports that she has never smoked. She has never used smokeless tobacco. She reports previous alcohol use. She reports that she does not use drugs.  Medications:  Prior to Admission medications   Medication Sig Start Date End Date Taking? Authorizing Provider  acetaminophen (TYLENOL) 650 MG CR tablet Take 650 mg by mouth daily as needed (for headaches, discomfort or back pain).   Yes [provider]  aspirin EC 81 MG tablet Take 81 mg by mouth every other day.   Yes [provider]  AZO-CRANBERRY PO  Take by mouth daily.   Yes [provider]  Cholecalciferol (VITAMIN D3 HIGH POTENCY PO) Take 1,000 mcg by mouth daily.    Yes [provider]  Flaxseed Oil OIL by Does not apply route daily.   Yes [provider]  Ginkgo Biloba 100 MG CAPS continuous as needed.   Yes [provider]  guaiFENesin (MUCINEX) 600 MG 12 hr tablet Take by mouth continuous as needed.   Yes [provider]  hydroxychloroquine (PLAQUENIL) 200 MG tablet Take 400 mg by mouth daily.   Yes [provider]  ipratropium (ATROVENT) 0.06 % nasal spray Place 2 sprays into both nostrils daily as needed. Uses once a day    Yes [provider]   meclizine (ANTIVERT) 25 MG tablet Take 25 mg by mouth 3 (three) times daily as needed. For dizziness/vertigo   Yes [provider]  Nutritional Supplements (JUICE PLUS FIBRE PO) Take by mouth 2 (two) times daily.   Yes [provider]  Tafluprost (ZIOPTAN OP) Apply to eye. Left eye only   Yes [provider]      Allergies  Allergen Reactions  . Codeine Other (See Comments)    Makes the patient feel like she has "cotton mouth" and "weird"; ineffective, also  . Contrast Media [Iodinated Diagnostic Agents] Other (See Comments)    Numbness, burning  . Penicillins Other (See Comments)    "Childhood allergy" Has patient had a PCN reaction causing immediate rash, facial/tongue/throat swelling, SOB or lightheadedness with hypotension: Unk Has patient had a PCN reaction causing severe rash involving mucus membranes or skin necrosis: Unk Has patient had a PCN reaction that required hospitalization: Unk Has patient had a PCN reaction occurring within the last 10 years: No If all of the above answers are "NO", then may proceed with Cephalosporin use.   . Sulfa Drugs Cross Reactors Other (See Comments)    Reaction unknown (allergy is from childhood)  . Chlorhexidine Rash  . Latex Rash    No purple gloves  . Povidone Rash    ROS:  Out of a complete 14 system review of symptoms, the patient complains only of the following symptoms, and all other reviewed systems are negative.  Hearing loss, ringing in the ears, runny nose, difficulty swallowing Eye discharge, eye redness, light sensitivity, loss of vision, eye pain, blurred vision Cough, choking Insomnia Environmental allergies Incontinence of the bladder, frequency of urination, urinary urgency Back pain Dizziness, weakness Agitation, anxiety  Blood pressure (!) 142/77, pulse 69, height 5' 4.5" (1.638 m), weight 131 lb 5 oz (59.6 kg).  Physical Exam  General: The patient is alert and cooperative at the  time of the examination.  Eyes: Pupils are equal, round, and reactive to light. Discs are flat bilaterally.  Neck: The neck is supple, no carotid bruits are noted.  Respiratory: The respiratory examination is clear.  Cardiovascular: The cardiovascular examination reveals a regular rate and rhythm, no obvious murmurs or rubs are noted.  Skin: Extremities are without significant edema.  Neurologic Exam  Mental status: The patient is alert and oriented x 3 at the time of the examination. The patient has apparent normal recent and remote memory, with an apparently normal attention span and concentration ability.  Cranial nerves: Facial symmetry is present. There is good sensation of the face to pinprick and soft touch bilaterally. The strength of the facial muscles and the muscles to head turning and shoulder shrug are normal bilaterally. Speech is well enunciated, no  aphasia or dysarthria is noted. Extraocular movements are full. Visual fields are full. The tongue is midline, and the patient has symmetric elevation of the soft palate. No obvious hearing deficits are noted.  Some masking of the face is noted.  Motor: The motor testing reveals 5 over 5 strength of all 4 extremities. Good symmetric motor tone is noted throughout.  Sensory: Sensory testing is intact to pinprick, soft touch, vibration sensation, and position sense on all 4 extremities, with exception of some decreased pinprick section on the left arm relative to the right. No evidence of extinction is noted.  Coordination: Cerebellar testing reveals good finger-nose-finger and heel-to-shin bilaterally.  Gait and station: Gait is normal. Tandem gait is very slightly unsteady. Romberg is negative. No drift is seen.  Reflexes: Deep tendon reflexes are symmetric and normal bilaterally. Toes are downgoing bilaterally.   Assessment/Plan:  1.  Right shoulder pain, dropping things from right hand  2.  Chronic low back pain  3.   Occasional neck pain  4.  Occasional headaches  The patient currently is much improved with her symptomatology, she does have some mild right shoulder pain that seems to worsen if she overuses the right arm.  She does not appear to have any weakness currently, she has not been dropping things from her hand recently.  We will watch the patient conservatively at this point, if she does have worsening of symptoms she is to contact our office.  She will follow-up if needed.  Jill Alexanders MD 07/10/2018 10:35 AM  [facility] [address] [place], [place] [address]  Phone [phone] Fax [phone]

## 2018-07-27 DIAGNOSIS — Z950 Presence of cardiac pacemaker: Secondary | ICD-10-CM | POA: Diagnosis not present

## 2018-07-27 DIAGNOSIS — I442 Atrioventricular block, complete: Secondary | ICD-10-CM | POA: Diagnosis not present

## 2018-07-27 DIAGNOSIS — Z45018 Encounter for adjustment and management of other part of cardiac pacemaker: Secondary | ICD-10-CM | POA: Diagnosis not present

## 2018-08-06 DIAGNOSIS — E559 Vitamin D deficiency, unspecified: Secondary | ICD-10-CM | POA: Diagnosis not present

## 2018-08-06 DIAGNOSIS — E7849 Other hyperlipidemia: Secondary | ICD-10-CM | POA: Diagnosis not present

## 2018-08-06 DIAGNOSIS — I10 Essential (primary) hypertension: Secondary | ICD-10-CM | POA: Diagnosis not present

## 2018-08-06 DIAGNOSIS — E1169 Type 2 diabetes mellitus with other specified complication: Secondary | ICD-10-CM | POA: Diagnosis not present

## 2018-08-12 ENCOUNTER — Ambulatory Visit: Payer: 59

## 2018-08-13 DIAGNOSIS — R809 Proteinuria, unspecified: Secondary | ICD-10-CM | POA: Diagnosis not present

## 2018-08-13 DIAGNOSIS — I10 Essential (primary) hypertension: Secondary | ICD-10-CM | POA: Diagnosis not present

## 2018-08-13 DIAGNOSIS — M8580 Other specified disorders of bone density and structure, unspecified site: Secondary | ICD-10-CM | POA: Diagnosis not present

## 2018-08-13 DIAGNOSIS — R42 Dizziness and giddiness: Secondary | ICD-10-CM | POA: Diagnosis not present

## 2018-08-13 DIAGNOSIS — Z0000 Encounter for general adult medical examination without abnormal findings: Secondary | ICD-10-CM | POA: Diagnosis not present

## 2018-08-13 DIAGNOSIS — I442 Atrioventricular block, complete: Secondary | ICD-10-CM | POA: Diagnosis not present

## 2018-08-13 DIAGNOSIS — E785 Hyperlipidemia, unspecified: Secondary | ICD-10-CM | POA: Diagnosis not present

## 2018-08-13 DIAGNOSIS — G459 Transient cerebral ischemic attack, unspecified: Secondary | ICD-10-CM | POA: Diagnosis not present

## 2018-08-13 DIAGNOSIS — I6389 Other cerebral infarction: Secondary | ICD-10-CM | POA: Diagnosis not present

## 2018-08-13 DIAGNOSIS — Z950 Presence of cardiac pacemaker: Secondary | ICD-10-CM | POA: Diagnosis not present

## 2018-08-13 DIAGNOSIS — E1169 Type 2 diabetes mellitus with other specified complication: Secondary | ICD-10-CM | POA: Diagnosis not present

## 2018-08-13 DIAGNOSIS — Z1331 Encounter for screening for depression: Secondary | ICD-10-CM | POA: Diagnosis not present

## 2018-08-31 ENCOUNTER — Telehealth: Payer: Self-pay | Admitting: Internal Medicine

## 2018-08-31 NOTE — Telephone Encounter (Signed)
New message    [facility] for pt to call back and schedule virtual appt with Dr. Donavan Burnet.

## 2018-09-15 DIAGNOSIS — G894 Chronic pain syndrome: Secondary | ICD-10-CM | POA: Diagnosis not present

## 2018-09-15 DIAGNOSIS — M50321 Other cervical disc degeneration at C4-C5 level: Secondary | ICD-10-CM | POA: Diagnosis not present

## 2018-09-15 DIAGNOSIS — G4489 Other headache syndrome: Secondary | ICD-10-CM | POA: Diagnosis not present

## 2018-10-06 DIAGNOSIS — H2511 Age-related nuclear cataract, right eye: Secondary | ICD-10-CM | POA: Diagnosis not present

## 2018-10-06 DIAGNOSIS — H401132 Primary open-angle glaucoma, bilateral, moderate stage: Secondary | ICD-10-CM | POA: Diagnosis not present

## 2018-10-06 DIAGNOSIS — H35372 Puckering of macula, left eye: Secondary | ICD-10-CM | POA: Diagnosis not present

## 2018-10-06 DIAGNOSIS — Z961 Presence of intraocular lens: Secondary | ICD-10-CM | POA: Diagnosis not present

## 2018-10-27 ENCOUNTER — Ambulatory Visit (INDEPENDENT_AMBULATORY_CARE_PROVIDER_SITE_OTHER): Payer: Medicare Other | Admitting: Cardiology

## 2018-10-27 ENCOUNTER — Encounter: Payer: Self-pay | Admitting: Cardiology

## 2018-10-27 VITALS — BP 134/70 | Ht 64.5 in | Wt 131.0 lb

## 2018-10-27 DIAGNOSIS — R55 Syncope and collapse: Secondary | ICD-10-CM

## 2018-10-27 DIAGNOSIS — I442 Atrioventricular block, complete: Secondary | ICD-10-CM | POA: Diagnosis not present

## 2018-10-27 DIAGNOSIS — R002 Palpitations: Secondary | ICD-10-CM | POA: Diagnosis not present

## 2018-10-27 DIAGNOSIS — Z950 Presence of cardiac pacemaker: Secondary | ICD-10-CM

## 2018-10-27 DIAGNOSIS — Z45018 Encounter for adjustment and management of other part of cardiac pacemaker: Secondary | ICD-10-CM | POA: Insufficient documentation

## 2018-10-27 HISTORY — DX: Encounter for adjustment and management of other part of cardiac pacemaker: Z45.018
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
## 2018-11-03 DIAGNOSIS — Z961 Presence of intraocular lens: Secondary | ICD-10-CM | POA: Diagnosis not present

## 2018-11-03 DIAGNOSIS — H401132 Primary open-angle glaucoma, bilateral, moderate stage: Secondary | ICD-10-CM | POA: Diagnosis not present

## 2018-11-03 DIAGNOSIS — H2511 Age-related nuclear cataract, right eye: Secondary | ICD-10-CM | POA: Diagnosis not present

## 2018-11-09 DIAGNOSIS — H2511 Age-related nuclear cataract, right eye: Secondary | ICD-10-CM | POA: Diagnosis not present

## 2018-11-09 DIAGNOSIS — Z961 Presence of intraocular lens: Secondary | ICD-10-CM | POA: Diagnosis not present

## 2018-11-09 DIAGNOSIS — H401132 Primary open-angle glaucoma, bilateral, moderate stage: Secondary | ICD-10-CM | POA: Diagnosis not present

## 2018-11-09 DIAGNOSIS — H35372 Puckering of macula, left eye: Secondary | ICD-10-CM | POA: Diagnosis not present

## 2018-11-24 DIAGNOSIS — H401132 Primary open-angle glaucoma, bilateral, moderate stage: Secondary | ICD-10-CM | POA: Diagnosis not present

## 2018-11-24 DIAGNOSIS — Z961 Presence of intraocular lens: Secondary | ICD-10-CM | POA: Diagnosis not present

## 2018-11-24 DIAGNOSIS — H2511 Age-related nuclear cataract, right eye: Secondary | ICD-10-CM | POA: Diagnosis not present

## 2018-12-16 DIAGNOSIS — M545 Low back pain: Secondary | ICD-10-CM | POA: Diagnosis not present

## 2018-12-19 ENCOUNTER — Encounter: Payer: Self-pay | Admitting: Emergency Medicine

## 2018-12-19 ENCOUNTER — Ambulatory Visit
Admission: EM | Admit: 2018-12-19 | Discharge: 2018-12-19 | Disposition: A | Discharge status: Home / Self Care | Payer: Medicare Other | Attending: Physician Assistant | Admitting: Physician Assistant

## 2018-12-19 ENCOUNTER — Other Ambulatory Visit: Payer: Self-pay

## 2018-12-19 DIAGNOSIS — W57XXXA Bitten or stung by nonvenomous insect and other nonvenomous arthropods, initial encounter: Secondary | ICD-10-CM

## 2018-12-19 DIAGNOSIS — S90861A Insect bite (nonvenomous), right foot, initial encounter: Secondary | ICD-10-CM

## 2018-12-19 MED ORDER — MUPIROCIN 2 % EX OINT: 1.0000 "application " | TOPICAL_OINTMENT | Freq: Two times a day (BID) | CUTANEOUS | 0 refills | Status: DC

## 2018-12-19 NOTE — ED Triage Notes (Signed)
Pt here for possible insect bite to right foot

## 2018-12-19 NOTE — ED Provider Notes (Signed)
[facility]    CSN: [id number] Arrival date & time: 12/19/18  1555      History   Chief Complaint Chief Complaint  Patient presents with  . Insect Bite    HPI Charlene Barker is a 73 y.o. female.   73 year old female comes in for evaluation of insect bite few hours prior to arrival. States she felt a "sting" and wanted to make sure it is not a snake bite as it is still hurting. She denies erythema, warmth, swelling. Denies allergic reaction like symptoms such as oral swelling, tripoding, drooling, shortness of breaths. Denies hives, itching. States she put an onion on the wound to help with swelling.      Past Medical History:  Diagnosis Date  . Anxiety   . Atrioventricular block, complete -intermittent       . Drug-induced hyperkalemia -associated with Aldactone   . GERD (gastroesophageal reflux disease)   . Glaucoma    bilateral  . HTN (hypertension)   . Left bundle branch block   . Lightheadedness    Associated with exercise  . 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 Pacemaker    St Jude  . Paresthesia of both legs 03/02/2015  . Rheumatoid arthritis ([place])   . Spinal stenosis of lumbar region 03/02/2015   L4-5  . Syncope     Patient Active Problem List   Diagnosis Date Noted  . Encounter for care of pacemaker 10/27/2018  . Tremor 07/18/2016  . Paresthesia 03/02/2015  . Spinal stenosis of lumbar region 03/02/2015  . Other specified transient cerebral ischemias 12/27/2014  . 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 Anxiety 08/13/2012  . Pacemaker--St Jude Accent DR 2110 dual chamber pacemaker12/18/2013 04/15/2012  . Complete heart block ([place]) 04/14/2012  . Syncope   . Left bundle branch block   . Lightheadedness     Past Surgical History:  Procedure Laterality Date  . BLADDER SURGERY     sling  . EYE SURGERY     several, bilateral  . LOOP RECORDER IMPLANT N/A 09/20/2011   Procedure: LOOP RECORDER IMPLANT;  Surgeon: Deboraha Sprang,  MD;  Location[facility] C[facility] LAB;  Service: Cardiovascular;  Laterality: N/A;  . PERMANENT PACEMAKER INSERTION N/A 04/15/2012    OB History   No obstetric history on file.      Home Medications    Prior to Admission medications   Medication Sig Start Date End Date Taking? Authorizing Provider  acetaminophen (TYLENOL) 650 MG CR tablet Take 650 mg by mouth daily as needed (for headaches, discomfort or back pain).    [provider]  aspirin EC 81 MG tablet Take 81 mg by mouth every other day.    [provider]  AZO-CRANBERRY PO Take by mouth daily.    [provider]  Cholecalciferol (VITAMIN D3 HIGH POTENCY PO) Take 1,000 mcg by mouth daily.     [provider]  Flaxseed Oil OIL by Does not apply route daily.    [provider]  Ginkgo Biloba 100 MG CAPS continuous as needed.    [provider]  guaiFENesin (MUCINEX) 600 MG 12 hr tablet Take by mouth continuous as needed.    [provider]  hydroxychloroquine (PLAQUENIL) 200 MG tablet Take 400 mg by mouth daily.    [provider]  ipratropium (ATROVENT) 0.06 % nasal spray Place 2 sprays into both nostrils daily as needed. Uses once a day     [provider]  meclizine (ANTIVERT) 25 MG tablet Take 25 mg by mouth 3 (three) times daily as needed. For dizziness/vertigo    [provider]  mupirocin ointment (BACTROBAN) 2 % Apply 1 application topically 2 (two) times daily. 12/19/18   Tasia Catchings,  V, PA-C  Nutritional Supplements (JUICE PLUS FIBRE PO) Take by mouth 2 (two) times daily.    [provider]  Tafluprost (ZIOPTAN OP) Apply to eye. Left eye only    [provider]    Family History Family History  Problem Relation Age of Onset  . Leukemia Mother   . Stroke Father   . Heart failure Father   . Heart attack Father   . Heart disease Father   . Diabetes Sister   . Asthma Brother   . Restless legs syndrome Brother     Social  History Social History   Tobacco Use  . Smoking status: Never Smoker  . Smokeless tobacco: Never Used  Substance Use Topics  . Alcohol use: Not Currently    Comment: wine only at christmas time  . Drug use: No     Allergies   Codeine, Contrast media [iodinated diagnostic agents], Penicillins, Sulfa drugs cross reactors, Chlorhexidine, Latex, and Povidone   Review of Systems Review of Systems  Reason unable to perform ROS: See HPI as above.     Physical Exam Triage Vital Signs ED Triage Vitals  Enc Vitals Group     BP      Pulse      Resp      Temp      Temp src      SpO2      Weight      Height      Head Circumference      Peak Flow      Pain Score      Pain Loc      Pain Edu?      Excl. in [place]?    No data found.  Updated Vital Signs BP (!) 172/81 (BP Location: Left Arm)   Pulse 71   Temp 98.1 F (36.7 C) (Oral)   Resp 18   SpO2 98%   Physical Exam Constitutional:      General: She is not in acute distress.    Appearance: She is well-developed. She is not diaphoretic.  HENT:     Head: Normocephalic and atraumatic.  Eyes:     Conjunctiva/sclera: Conjunctivae normal.     Pupils: Pupils are equal, round, and reactive to light.  Pulmonary:     Effort: Pulmonary effort is normal. No respiratory distress.  Skin:    General: Skin is warm and dry.     Comments: Small 0.60mm round erythema to the lateral first MTP of right foot. No swelling, open wound. No contusion. No foreign body. No tenderness to palpation.   Neurological:     Mental Status: She is alert and oriented to person, place, and time.     UC Treatments / Results  Labs (all labs ordered are listed, but only abnormal results are displayed) Labs Reviewed - No data to display  EKG   Radiology No results found.  Procedures Procedures (including critical care time)  Medications Ordered in UC Medications - No data to display  Initial Impression / Assessment and Plan / UC Course  I  have reviewed the triage vital signs and the nursing notes.  Pertinent labs & imaging results that were available during my care of the patient  were reviewed by me and considered in my medical decision making (see chart for details).     No alarming signs. Reassurance provided. Symptomatic treatment discussed. Return precautions given.  Final Clinical Impressions(s) / UC Diagnoses   Final diagnoses:  Insect bite of right foot, initial encounter    ED Prescriptions    Medication Sig Dispense Auth. Provider   mupirocin ointment (BACTROBAN) 2 % Apply 1 application topically 2 (two) times daily. 22 g Tobin Chad, [place] 12/19/18 1611

## 2018-12-19 NOTE — Discharge Instructions (Signed)
No alarming signs at this time. You can take ibuprofen for pain. Otherwise, elevate and ice area to help with pain and swelling. Keep area clean and dry. Clean with soap and water. You can apply bactroban ointment to the area to prevent infection. Monitor for worsening swelling, pain, redness, warmth, follow up for reevaluation needed.

## 2019-01-14 DIAGNOSIS — H0102A Squamous blepharitis right eye, upper and lower eyelids: Secondary | ICD-10-CM | POA: Diagnosis not present

## 2019-01-14 DIAGNOSIS — H04123 Dry eye syndrome of bilateral lacrimal glands: Secondary | ICD-10-CM | POA: Diagnosis not present

## 2019-01-14 DIAGNOSIS — H0102B Squamous blepharitis left eye, upper and lower eyelids: Secondary | ICD-10-CM | POA: Diagnosis not present

## 2019-01-14 DIAGNOSIS — H401134 Primary open-angle glaucoma, bilateral, indeterminate stage: Secondary | ICD-10-CM | POA: Diagnosis not present

## 2019-01-20 DIAGNOSIS — H401134 Primary open-angle glaucoma, bilateral, indeterminate stage: Secondary | ICD-10-CM | POA: Diagnosis not present

## 2019-01-20 DIAGNOSIS — H2511 Age-related nuclear cataract, right eye: Secondary | ICD-10-CM | POA: Diagnosis not present

## 2019-01-20 DIAGNOSIS — H04123 Dry eye syndrome of bilateral lacrimal glands: Secondary | ICD-10-CM | POA: Diagnosis not present

## 2019-01-21 ENCOUNTER — Ambulatory Visit: Payer: 59 | Admitting: Cardiology

## 2019-01-26 DIAGNOSIS — H2511 Age-related nuclear cataract, right eye: Secondary | ICD-10-CM | POA: Diagnosis not present

## 2019-01-26 DIAGNOSIS — H401134 Primary open-angle glaucoma, bilateral, indeterminate stage: Secondary | ICD-10-CM | POA: Diagnosis not present

## 2019-01-26 DIAGNOSIS — H04123 Dry eye syndrome of bilateral lacrimal glands: Secondary | ICD-10-CM | POA: Diagnosis not present

## 2019-02-05 DIAGNOSIS — H401134 Primary open-angle glaucoma, bilateral, indeterminate stage: Secondary | ICD-10-CM | POA: Diagnosis not present

## 2019-02-05 DIAGNOSIS — H409 Unspecified glaucoma: Secondary | ICD-10-CM | POA: Diagnosis not present

## 2019-02-15 DIAGNOSIS — F4310 Post-traumatic stress disorder, unspecified: Secondary | ICD-10-CM | POA: Diagnosis not present

## 2019-02-15 DIAGNOSIS — G894 Chronic pain syndrome: Secondary | ICD-10-CM | POA: Diagnosis not present

## 2019-02-15 DIAGNOSIS — S134XXS Sprain of ligaments of cervical spine, sequela: Secondary | ICD-10-CM | POA: Diagnosis not present

## 2019-02-15 DIAGNOSIS — M50321 Other cervical disc degeneration at C4-C5 level: Secondary | ICD-10-CM | POA: Diagnosis not present

## 2019-02-16 DIAGNOSIS — H409 Unspecified glaucoma: Secondary | ICD-10-CM | POA: Diagnosis not present

## 2019-02-16 DIAGNOSIS — E785 Hyperlipidemia, unspecified: Secondary | ICD-10-CM | POA: Diagnosis not present

## 2019-02-16 DIAGNOSIS — F329 Major depressive disorder, single episode, unspecified: Secondary | ICD-10-CM | POA: Diagnosis not present

## 2019-02-16 DIAGNOSIS — I442 Atrioventricular block, complete: Secondary | ICD-10-CM | POA: Diagnosis not present

## 2019-02-16 DIAGNOSIS — Z950 Presence of cardiac pacemaker: Secondary | ICD-10-CM | POA: Diagnosis not present

## 2019-02-16 DIAGNOSIS — G459 Transient cerebral ischemic attack, unspecified: Secondary | ICD-10-CM | POA: Diagnosis not present

## 2019-02-16 DIAGNOSIS — I10 Essential (primary) hypertension: Secondary | ICD-10-CM | POA: Diagnosis not present

## 2019-02-16 DIAGNOSIS — E1169 Type 2 diabetes mellitus with other specified complication: Secondary | ICD-10-CM | POA: Diagnosis not present

## 2019-02-16 DIAGNOSIS — R945 Abnormal results of liver function studies: Secondary | ICD-10-CM | POA: Diagnosis not present

## 2019-02-16 DIAGNOSIS — I6389 Other cerebral infarction: Secondary | ICD-10-CM | POA: Diagnosis not present

## 2019-02-17 ENCOUNTER — Ambulatory Visit (INDEPENDENT_AMBULATORY_CARE_PROVIDER_SITE_OTHER): Payer: Medicare Other | Admitting: Cardiology

## 2019-02-17 ENCOUNTER — Other Ambulatory Visit: Payer: Self-pay

## 2019-02-17 ENCOUNTER — Encounter: Payer: Self-pay | Admitting: Cardiology

## 2019-02-17 VITALS — Ht 64.0 in | Wt 134.0 lb

## 2019-02-17 DIAGNOSIS — I495 Sick sinus syndrome: Secondary | ICD-10-CM | POA: Diagnosis not present

## 2019-02-17 DIAGNOSIS — R002 Palpitations: Secondary | ICD-10-CM

## 2019-02-17 DIAGNOSIS — Z45018 Encounter for adjustment and management of other part of cardiac pacemaker: Secondary | ICD-10-CM | POA: Diagnosis not present

## 2019-02-17 DIAGNOSIS — Z950 Presence of cardiac pacemaker: Secondary | ICD-10-CM

## 2019-02-17 DIAGNOSIS — I442 Atrioventricular block, complete: Secondary | ICD-10-CM

## 2019-02-17 HISTORY — DX: Sick sinus syndrome: I49.5
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
## 2019-02-19 DIAGNOSIS — G4489 Other headache syndrome: Secondary | ICD-10-CM | POA: Diagnosis not present

## 2019-02-19 DIAGNOSIS — G894 Chronic pain syndrome: Secondary | ICD-10-CM | POA: Diagnosis not present

## 2019-02-19 DIAGNOSIS — M50321 Other cervical disc degeneration at C4-C5 level: Secondary | ICD-10-CM | POA: Diagnosis not present

## 2019-02-21 ENCOUNTER — Encounter: Payer: Self-pay | Admitting: Cardiology

## 2019-02-22 ENCOUNTER — Telehealth: Payer: Self-pay

## 2019-02-22 NOTE — Telephone Encounter (Signed)
Proficient referral - left message.

## 2019-02-23 DIAGNOSIS — H04123 Dry eye syndrome of bilateral lacrimal glands: Secondary | ICD-10-CM | POA: Diagnosis not present

## 2019-02-23 DIAGNOSIS — Z961 Presence of intraocular lens: Secondary | ICD-10-CM | POA: Diagnosis not present

## 2019-02-23 DIAGNOSIS — H401132 Primary open-angle glaucoma, bilateral, moderate stage: Secondary | ICD-10-CM | POA: Diagnosis not present

## 2019-02-23 DIAGNOSIS — H2511 Age-related nuclear cataract, right eye: Secondary | ICD-10-CM | POA: Diagnosis not present

## 2019-03-15 DIAGNOSIS — L659 Nonscarring hair loss, unspecified: Secondary | ICD-10-CM | POA: Diagnosis not present

## 2019-03-15 DIAGNOSIS — I10 Essential (primary) hypertension: Secondary | ICD-10-CM | POA: Diagnosis not present

## 2019-03-15 DIAGNOSIS — F419 Anxiety disorder, unspecified: Secondary | ICD-10-CM | POA: Diagnosis not present

## 2019-03-15 DIAGNOSIS — R7989 Other specified abnormal findings of blood chemistry: Secondary | ICD-10-CM | POA: Diagnosis not present

## 2019-03-15 DIAGNOSIS — L603 Nail dystrophy: Secondary | ICD-10-CM | POA: Diagnosis not present

## 2019-03-15 DIAGNOSIS — E1169 Type 2 diabetes mellitus with other specified complication: Secondary | ICD-10-CM | POA: Diagnosis not present

## 2019-03-15 DIAGNOSIS — R202 Paresthesia of skin: Secondary | ICD-10-CM | POA: Diagnosis not present

## 2019-03-15 DIAGNOSIS — E7849 Other hyperlipidemia: Secondary | ICD-10-CM | POA: Diagnosis not present

## 2019-03-23 DIAGNOSIS — H409 Unspecified glaucoma: Secondary | ICD-10-CM | POA: Diagnosis not present

## 2019-03-23 DIAGNOSIS — H2511 Age-related nuclear cataract, right eye: Secondary | ICD-10-CM | POA: Diagnosis not present

## 2019-03-23 DIAGNOSIS — H401132 Primary open-angle glaucoma, bilateral, moderate stage: Secondary | ICD-10-CM | POA: Diagnosis not present

## 2019-03-23 DIAGNOSIS — H04123 Dry eye syndrome of bilateral lacrimal glands: Secondary | ICD-10-CM | POA: Diagnosis not present

## 2019-03-23 DIAGNOSIS — Z961 Presence of intraocular lens: Secondary | ICD-10-CM | POA: Diagnosis not present

## 2019-03-24 DIAGNOSIS — H838X3 Other specified diseases of inner ear, bilateral: Secondary | ICD-10-CM | POA: Diagnosis not present

## 2019-03-24 DIAGNOSIS — H608X2 Other otitis externa, left ear: Secondary | ICD-10-CM | POA: Diagnosis not present

## 2019-03-24 DIAGNOSIS — H903 Sensorineural hearing loss, bilateral: Secondary | ICD-10-CM | POA: Diagnosis not present

## 2019-05-06 DIAGNOSIS — Z1231 Encounter for screening mammogram for malignant neoplasm of breast: Secondary | ICD-10-CM | POA: Diagnosis not present

## 2019-05-25 DIAGNOSIS — Z950 Presence of cardiac pacemaker: Secondary | ICD-10-CM | POA: Diagnosis not present

## 2019-05-25 DIAGNOSIS — Z45018 Encounter for adjustment and management of other part of cardiac pacemaker: Secondary | ICD-10-CM | POA: Diagnosis not present

## 2019-05-25 DIAGNOSIS — I442 Atrioventricular block, complete: Secondary | ICD-10-CM | POA: Diagnosis not present

## 2019-06-15 DIAGNOSIS — H35372 Puckering of macula, left eye: Secondary | ICD-10-CM | POA: Diagnosis not present

## 2019-07-06 DIAGNOSIS — G894 Chronic pain syndrome: Secondary | ICD-10-CM | POA: Diagnosis not present

## 2019-07-06 DIAGNOSIS — S134XXS Sprain of ligaments of cervical spine, sequela: Secondary | ICD-10-CM | POA: Diagnosis not present

## 2019-07-09 ENCOUNTER — Encounter: Payer: Self-pay | Admitting: Psychology

## 2019-07-09 DIAGNOSIS — D2239 Melanocytic nevi of other parts of face: Secondary | ICD-10-CM | POA: Diagnosis not present

## 2019-07-09 DIAGNOSIS — D485 Neoplasm of uncertain behavior of skin: Secondary | ICD-10-CM | POA: Diagnosis not present

## 2019-07-09 DIAGNOSIS — L720 Epidermal cyst: Secondary | ICD-10-CM | POA: Diagnosis not present

## 2019-07-09 DIAGNOSIS — L820 Inflamed seborrheic keratosis: Secondary | ICD-10-CM | POA: Diagnosis not present

## 2019-07-09 DIAGNOSIS — D1801 Hemangioma of skin and subcutaneous tissue: Secondary | ICD-10-CM | POA: Diagnosis not present

## 2019-07-09 DIAGNOSIS — D2262 Melanocytic nevi of left upper limb, including shoulder: Secondary | ICD-10-CM | POA: Diagnosis not present

## 2019-07-09 DIAGNOSIS — L821 Other seborrheic keratosis: Secondary | ICD-10-CM | POA: Diagnosis not present

## 2019-07-13 DIAGNOSIS — H2511 Age-related nuclear cataract, right eye: Secondary | ICD-10-CM | POA: Diagnosis not present

## 2019-07-13 DIAGNOSIS — Z961 Presence of intraocular lens: Secondary | ICD-10-CM | POA: Diagnosis not present

## 2019-07-13 DIAGNOSIS — H401132 Primary open-angle glaucoma, bilateral, moderate stage: Secondary | ICD-10-CM | POA: Diagnosis not present

## 2019-07-13 DIAGNOSIS — H04123 Dry eye syndrome of bilateral lacrimal glands: Secondary | ICD-10-CM | POA: Diagnosis not present

## 2019-07-22 ENCOUNTER — Encounter: Payer: Medicare Other | Admitting: Psychology

## 2019-07-22 DIAGNOSIS — G894 Chronic pain syndrome: Secondary | ICD-10-CM | POA: Diagnosis not present

## 2019-07-27 ENCOUNTER — Emergency Department (HOSPITAL_COMMUNITY): Payer: Medicare Other

## 2019-07-27 ENCOUNTER — Emergency Department (HOSPITAL_COMMUNITY)
Admission: EM | Admit: 2019-07-27 | Discharge: 2019-07-27 | Disposition: A | Discharge status: Home / Self Care | Payer: Medicare Other | Attending: Emergency Medicine | Admitting: Emergency Medicine

## 2019-07-27 ENCOUNTER — Encounter (HOSPITAL_COMMUNITY): Payer: Self-pay

## 2019-07-27 ENCOUNTER — Other Ambulatory Visit: Payer: Self-pay

## 2019-07-27 DIAGNOSIS — Y999 Unspecified external cause status: Secondary | ICD-10-CM | POA: Diagnosis not present

## 2019-07-27 DIAGNOSIS — S42292A Other displaced fracture of upper end of left humerus, initial encounter for closed fracture: Secondary | ICD-10-CM | POA: Insufficient documentation

## 2019-07-27 DIAGNOSIS — Y9389 Activity, other specified: Secondary | ICD-10-CM | POA: Diagnosis not present

## 2019-07-27 DIAGNOSIS — Y929 Unspecified place or not applicable: Secondary | ICD-10-CM | POA: Insufficient documentation

## 2019-07-27 DIAGNOSIS — W010XXA Fall on same level from slipping, tripping and stumbling without subsequent striking against object, initial encounter: Secondary | ICD-10-CM | POA: Diagnosis not present

## 2019-07-27 DIAGNOSIS — R609 Edema, unspecified: Secondary | ICD-10-CM | POA: Diagnosis not present

## 2019-07-27 DIAGNOSIS — S4992XA Unspecified injury of left shoulder and upper arm, initial encounter: Secondary | ICD-10-CM | POA: Diagnosis present

## 2019-07-27 DIAGNOSIS — W19XXXA Unspecified fall, initial encounter: Secondary | ICD-10-CM | POA: Diagnosis not present

## 2019-07-27 DIAGNOSIS — S4292XA Fracture of left shoulder girdle, part unspecified, initial encounter for closed fracture: Secondary | ICD-10-CM | POA: Diagnosis not present

## 2019-07-27 DIAGNOSIS — R52 Pain, unspecified: Secondary | ICD-10-CM | POA: Diagnosis not present

## 2019-07-27 DIAGNOSIS — I10 Essential (primary) hypertension: Secondary | ICD-10-CM | POA: Diagnosis not present

## 2019-07-27 MED ORDER — HYDROCODONE-ACETAMINOPHEN 5-325 MG PO TABS
0.5000 | ORAL_TABLET | Freq: Once | ORAL | Status: AC
  Administered 2019-07-27: 0.5 via ORAL
  Filled 2019-07-27: qty 1

## 2019-07-27 MED ORDER — HYDROCODONE-ACETAMINOPHEN 5-325 MG PO TABS: ORAL_TABLET | ORAL | 0 refills | Status: DC

## 2019-07-27 NOTE — ED Provider Notes (Signed)
[facility] DEPT Provider Note   CSN: [id number] Arrival date & time: 07/27/19  2000     History Chief Complaint  Patient presents with  . Shoulder Pain    left    Charlene Barker is a 74 y.o. female.  The patient states that she fell on her left shoulder.  Patient tripped.  She did not hit her head  The history is provided by the patient. No language interpreter was used.  Shoulder Pain Location:  Shoulder Shoulder location:  L shoulder Injury: yes   Mechanism of injury: fall   Fall:    Fall occurred:  Down stairs   Impact surface:  Hard floor   Point of impact: Left shoulder.   Entrapped after fall: no   Pain details:    Quality:  Aching Associated symptoms: no back pain and no fatigue        Past Medical History:  Diagnosis Date  . Anxiety   . Atrioventricular block, complete -intermittent       . Complete heart block ([place]) 04/14/2012  . Drug-induced hyperkalemia -associated with Aldactone   . Encounter for care of pacemaker 10/27/2018  . GERD (gastroesophageal reflux disease)   . Glaucoma    bilateral  . HTN (hypertension)   . Left bundle branch block   . Lightheadedness    Associated with exercise  . 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 Pacemaker    St Jude  . Paresthesia of both legs 03/02/2015  . Rheumatoid arthritis ([place])   . Sinus node dysfunction ([place]) 02/17/2019  . Spinal stenosis of lumbar region 03/02/2015   L4-5  . Syncope     Patient Active Problem List   Diagnosis Date Noted  . Sinus node dysfunction ([place]) 02/17/2019  . Palpitations 02/17/2019  . Encounter for care of pacemaker 10/27/2018  . Tremor 07/18/2016  . Paresthesia 03/02/2015  . Spinal stenosis of lumbar region 03/02/2015  . Other specified transient cerebral ischemias 12/27/2014  . 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 Anxiety 08/13/2012  . Pacemaker--St Jude Accent DR 2110 dual chamber pacemaker12/18/2013 04/15/2012  .  Complete heart block ([place]) 04/14/2012  . Syncope   . Left bundle branch block   . Lightheadedness     Past Surgical History:  Procedure Laterality Date  . BLADDER SURGERY     sling  . EYE SURGERY     several, bilateral  . LOOP RECORDER IMPLANT N/A 09/20/2011   Procedure: LOOP RECORDER IMPLANT;  Surgeon: Deboraha Sprang, MD;  Location: [facility] CATH LAB;  Service: Cardiovascular;  Laterality: N/A;  . PERMANENT PACEMAKER INSERTION N/A 04/15/2012     OB History   No obstetric history on file.     Family History  Problem Relation Age of Onset  . Leukemia Mother   . Stroke Father   . Heart failure Father   . Heart attack Father   . Heart disease Father   . Diabetes Sister   . Asthma Brother   . Restless legs syndrome Brother     Social History   Tobacco Use  . Smoking status: Never Smoker  . Smokeless tobacco: Never Used  Substance Use Topics  . Alcohol use: Not Currently    Comment: wine only at christmas time  . Drug use: No    Home Medications Prior to Admission medications   Medication Sig Start Date End Date Taking? Authorizing Provider  acetaminophen (TYLENOL) 650 MG CR tablet Take 650 mg by  mouth daily as needed (for headaches, discomfort or back pain).    [provider]  aspirin EC 81 MG tablet Take 81 mg by mouth every other day.    [provider]  AZO-CRANBERRY PO Take by mouth daily.    [provider]  Cholecalciferol (VITAMIN D3 HIGH POTENCY PO) Take 1,000 mcg by mouth daily.     [provider]  DORZOLAMIDE HCL OP Apply to eye daily.    [provider]  Flaxseed Oil OIL by Does not apply route daily.    [provider]  guaiFENesin (MUCINEX) 600 MG 12 hr tablet Take by mouth continuous as needed.    [provider]  HYDROcodone-acetaminophen (NORCO/VICODIN) 5-325 MG tablet Take either a half of a pill or a whole pill every 4-6 hours for pain 07/27/19   Milton Ferguson, MD  latanoprost (XALATAN)  0.005 % ophthalmic solution 1 drop at bedtime.    [provider]  meclizine (ANTIVERT) 25 MG tablet Take 25 mg by mouth 3 (three) times daily as needed. For dizziness/vertigo    [provider]  Nutritional Supplements (JUICE PLUS FIBRE PO) Take by mouth 2 (two) times daily.    [provider]    Allergies    Codeine, Contrast media [iodinated diagnostic agents], Penicillins, Sulfa drugs cross reactors, Chlorhexidine, Latex, and Povidone  Review of Systems   Review of Systems  Constitutional: Negative for appetite change and fatigue.  HENT: Negative for congestion, ear discharge and sinus pressure.   Eyes: Negative for discharge.  Respiratory: Negative for cough.   Cardiovascular: Negative for chest pain.  Gastrointestinal: Negative for abdominal pain and diarrhea.  Genitourinary: Negative for frequency and hematuria.  Musculoskeletal: Negative for back pain.       Left shoulder pain  Skin: Negative for rash.  Neurological: Negative for seizures and headaches.  Psychiatric/Behavioral: Negative for hallucinations.    Physical Exam Updated Vital Signs BP (!) 185/90 (BP Location: Right Arm)   Pulse 64   Temp 97.6 F (36.4 C) (Oral)   Resp 17   Ht 5\' 4"  (1.626 m)   Wt 60.3 kg   SpO2 100%   BMI 22.83 kg/m   Physical Exam Vitals and nursing note reviewed.  Constitutional:      Appearance: She is well-developed.  HENT:     Head: Normocephalic.     Nose: Nose normal.  Eyes:     General: No scleral icterus.    Conjunctiva/sclera: Conjunctivae normal.  Neck:     Thyroid: No thyromegaly.  Cardiovascular:     Rate and Rhythm: Normal rate and regular rhythm.     Heart sounds: No murmur. No friction rub. No gallop.   Pulmonary:     Breath sounds: No stridor. No wheezing or rales.  Chest:     Chest wall: No tenderness.  Abdominal:     General: There is no distension.     Tenderness: There is no abdominal tenderness. There is no rebound.    Musculoskeletal:        General: Normal range of motion.     Cervical back: Neck supple.     Comments: Tenderness to left shoulder with deformity.  Radial pulse 2+  Lymphadenopathy:     Cervical: No cervical adenopathy.  Skin:    Findings: No erythema or rash.  Neurological:     Mental Status: She is alert and oriented to person, place, and time.     Motor: No abnormal muscle tone.  Coordination: Coordination normal.  Psychiatric:        Behavior: Behavior normal.     ED Results / Procedures / Treatments   Labs (all labs ordered are listed, but only abnormal results are displayed) Labs Reviewed - No data to display  EKG None  Radiology DG Shoulder Left  Result Date: 07/27/2019 CLINICAL DATA:  Trip at home over concrete step leading to fall. Left shoulder pain. EXAM: LEFT SHOULDER - 2+ VIEW COMPARISON:  None. FINDINGS: Comminuted and displaced proximal humerus fracture with dominant fracture plane through the surgical neck. Displacement of the lateral humeral head of 9 mm. Fracture extends to the inferior aspect of the glenohumeral joint. No glenohumeral dislocation. Mild acromioclavicular osteoarthritis. Left sided pacemaker is partially included. IMPRESSION: Comminuted displaced proximal humerus fracture with dominant fracture plane through the surgical neck. Greatest fracture displacement is 9 mm. Electronically Signed   By: Keith Rake M.D.   On: 07/27/2019 20:37    Procedures Procedures (including critical care time)  Medications Ordered in ED Medications  HYDROcodone-acetaminophen (NORCO/VICODIN) 5-325 MG per tablet 0.5 tablet (has no administration in time range)    ED Course  I have reviewed the triage vital signs and the nursing notes.  Pertinent labs & imaging results that were available during my care of the patient were reviewed by me and considered in my medical decision making (see chart for details).    MDM Rules/Calculators/A&P                       Patient with a fractured left shoulder.  She is given a sling and pain medicine is to follow-up with orthopedics this week Final Clinical Impression(s) / ED Diagnoses Final diagnoses:  Shoulder fracture, left, closed, initial encounter    Rx / DC Orders ED Discharge Orders         Ordered    HYDROcodone-acetaminophen (NORCO/VICODIN) 5-325 MG tablet     07/27/19 2128           Milton Ferguson, MD 07/27/19 2131

## 2019-07-27 NOTE — ED Triage Notes (Signed)
Trip at home over a single concrete step. C/O left shoulder with abnormality noted.

## 2019-07-27 NOTE — Discharge Instructions (Signed)
Follow-up with your orthopedic surgeon either later this week or beginning of next week.

## 2019-07-29 DIAGNOSIS — M25512 Pain in left shoulder: Secondary | ICD-10-CM | POA: Diagnosis not present

## 2019-07-31 ENCOUNTER — Telehealth: Payer: Self-pay | Admitting: Cardiology

## 2019-07-31 DIAGNOSIS — I10 Essential (primary) hypertension: Secondary | ICD-10-CM | POA: Diagnosis not present

## 2019-07-31 DIAGNOSIS — W19XXXA Unspecified fall, initial encounter: Secondary | ICD-10-CM

## 2019-07-31 NOTE — Telephone Encounter (Signed)
Received a call from the patient on 07/30/2019 at 9:45 PM. Patient had a mechanical fall and comminuted displaced proximal humerus fracture with dominant fracture plane through the surgical neck, currently wearing a sling. Patient concerned about the report suggesting "Left sided pacemaker is partially included."  I personally reviewed the chest Xray. I do not see obvious lead fracture. Patient is not pacemaker dependent, based on last in person interrogation in 01/2019 and subsequent unscheduled alerts for SVT.  Will arrange remote/in office interrogation of patient's pacemaker in the coming week.  Time spent: 10 min  Ajmal Kathan Esther Hardy, MD

## 2019-08-01 NOTE — Telephone Encounter (Signed)
She has had repeat transmission as alert and it was a scheduled transmission as well. She has had chronic noise from the pacemaker leads, but normal threshold and impedance.  Will have her transmit remote once tomorrow and see if there is any change.  Please ask patient to sign up for the my chart and let her send in transmission manually tomorrow.  JG

## 2019-08-02 ENCOUNTER — Telehealth: Payer: Self-pay

## 2019-08-02 DIAGNOSIS — Z950 Presence of cardiac pacemaker: Secondary | ICD-10-CM

## 2019-08-04 ENCOUNTER — Telehealth: Payer: Self-pay

## 2019-08-04 NOTE — Telephone Encounter (Signed)
Received vm from patient requesting information about her pacemaker. Patient stated she had a fall and wanted to clarify that her pacemaker is still working correctly. Please advise. Thanks!

## 2019-08-04 NOTE — Telephone Encounter (Signed)
I personally called the patient and discussed with her regarding her pacemaker transmission.  Pacemaker is functioning normally.  She has had chronic noise in the leads which is remained stable as well.  She has known SVT in the past which are brief as well.  She was relieved to know that pacemaker is functioning normally.  We will continue to observe.  Unscheduled (Alert) 08/04/19  Multiple AMS episodes.  Available EGM suggest brief noise. No AF. Normal threshold and impedance. One HVR episode noted for 18 seconds. EGM suggests SVT/AT.  Adrian Prows, MD, [facility] 08/04/2019, 3:37 PM [facility]. [place] Office: [phone]

## 2019-08-05 DIAGNOSIS — M25512 Pain in left shoulder: Secondary | ICD-10-CM | POA: Diagnosis not present

## 2019-08-05 NOTE — Telephone Encounter (Signed)
Spoke with patient, and let her know that [place] ([id number]. Jude - rep) will be contacting her to help her remote transmit from home, instead of coming in.

## 2019-08-12 DIAGNOSIS — S4292XA Fracture of left shoulder girdle, part unspecified, initial encounter for closed fracture: Secondary | ICD-10-CM | POA: Diagnosis not present

## 2019-08-12 DIAGNOSIS — R42 Dizziness and giddiness: Secondary | ICD-10-CM | POA: Diagnosis not present

## 2019-08-12 DIAGNOSIS — E1169 Type 2 diabetes mellitus with other specified complication: Secondary | ICD-10-CM | POA: Diagnosis not present

## 2019-08-12 DIAGNOSIS — Z8679 Personal history of other diseases of the circulatory system: Secondary | ICD-10-CM | POA: Diagnosis not present

## 2019-08-12 DIAGNOSIS — Z950 Presence of cardiac pacemaker: Secondary | ICD-10-CM | POA: Diagnosis not present

## 2019-08-12 DIAGNOSIS — I10 Essential (primary) hypertension: Secondary | ICD-10-CM | POA: Diagnosis not present

## 2019-08-31 DIAGNOSIS — I10 Essential (primary) hypertension: Secondary | ICD-10-CM | POA: Diagnosis not present

## 2019-08-31 DIAGNOSIS — F419 Anxiety disorder, unspecified: Secondary | ICD-10-CM | POA: Diagnosis not present

## 2019-08-31 DIAGNOSIS — E1169 Type 2 diabetes mellitus with other specified complication: Secondary | ICD-10-CM | POA: Diagnosis not present

## 2019-09-09 DIAGNOSIS — M25512 Pain in left shoulder: Secondary | ICD-10-CM | POA: Diagnosis not present

## 2019-09-15 DIAGNOSIS — M25612 Stiffness of left shoulder, not elsewhere classified: Secondary | ICD-10-CM | POA: Diagnosis not present

## 2019-09-15 DIAGNOSIS — M25512 Pain in left shoulder: Secondary | ICD-10-CM | POA: Diagnosis not present

## 2019-09-15 DIAGNOSIS — M6281 Muscle weakness (generalized): Secondary | ICD-10-CM | POA: Diagnosis not present

## 2019-09-15 DIAGNOSIS — E1169 Type 2 diabetes mellitus with other specified complication: Secondary | ICD-10-CM | POA: Diagnosis not present

## 2019-09-15 DIAGNOSIS — E7849 Other hyperlipidemia: Secondary | ICD-10-CM | POA: Diagnosis not present

## 2019-09-15 DIAGNOSIS — S42202D Unspecified fracture of upper end of left humerus, subsequent encounter for fracture with routine healing: Secondary | ICD-10-CM | POA: Diagnosis not present

## 2019-09-15 DIAGNOSIS — M859 Disorder of bone density and structure, unspecified: Secondary | ICD-10-CM | POA: Diagnosis not present

## 2019-09-17 DIAGNOSIS — M25612 Stiffness of left shoulder, not elsewhere classified: Secondary | ICD-10-CM | POA: Diagnosis not present

## 2019-09-17 DIAGNOSIS — M6281 Muscle weakness (generalized): Secondary | ICD-10-CM | POA: Diagnosis not present

## 2019-09-17 DIAGNOSIS — S42202D Unspecified fracture of upper end of left humerus, subsequent encounter for fracture with routine healing: Secondary | ICD-10-CM | POA: Diagnosis not present

## 2019-09-17 DIAGNOSIS — M25512 Pain in left shoulder: Secondary | ICD-10-CM | POA: Diagnosis not present

## 2019-09-21 DIAGNOSIS — H401132 Primary open-angle glaucoma, bilateral, moderate stage: Secondary | ICD-10-CM | POA: Diagnosis not present

## 2019-09-21 DIAGNOSIS — H04123 Dry eye syndrome of bilateral lacrimal glands: Secondary | ICD-10-CM | POA: Diagnosis not present

## 2019-09-21 DIAGNOSIS — H2511 Age-related nuclear cataract, right eye: Secondary | ICD-10-CM | POA: Diagnosis not present

## 2019-09-21 DIAGNOSIS — Z961 Presence of intraocular lens: Secondary | ICD-10-CM | POA: Diagnosis not present

## 2019-09-22 DIAGNOSIS — I442 Atrioventricular block, complete: Secondary | ICD-10-CM | POA: Diagnosis not present

## 2019-09-22 DIAGNOSIS — I10 Essential (primary) hypertension: Secondary | ICD-10-CM | POA: Diagnosis not present

## 2019-09-22 DIAGNOSIS — E1169 Type 2 diabetes mellitus with other specified complication: Secondary | ICD-10-CM | POA: Diagnosis not present

## 2019-09-22 DIAGNOSIS — F419 Anxiety disorder, unspecified: Secondary | ICD-10-CM | POA: Diagnosis not present

## 2019-09-22 DIAGNOSIS — R82998 Other abnormal findings in urine: Secondary | ICD-10-CM | POA: Diagnosis not present

## 2019-09-22 DIAGNOSIS — M25612 Stiffness of left shoulder, not elsewhere classified: Secondary | ICD-10-CM | POA: Diagnosis not present

## 2019-09-22 DIAGNOSIS — S42202D Unspecified fracture of upper end of left humerus, subsequent encounter for fracture with routine healing: Secondary | ICD-10-CM | POA: Diagnosis not present

## 2019-09-22 DIAGNOSIS — M25512 Pain in left shoulder: Secondary | ICD-10-CM | POA: Diagnosis not present

## 2019-09-22 DIAGNOSIS — Z1331 Encounter for screening for depression: Secondary | ICD-10-CM | POA: Diagnosis not present

## 2019-09-22 DIAGNOSIS — Z950 Presence of cardiac pacemaker: Secondary | ICD-10-CM | POA: Diagnosis not present

## 2019-09-22 DIAGNOSIS — Z8673 Personal history of transient ischemic attack (TIA), and cerebral infarction without residual deficits: Secondary | ICD-10-CM | POA: Diagnosis not present

## 2019-09-22 DIAGNOSIS — G459 Transient cerebral ischemic attack, unspecified: Secondary | ICD-10-CM | POA: Diagnosis not present

## 2019-09-22 DIAGNOSIS — E785 Hyperlipidemia, unspecified: Secondary | ICD-10-CM | POA: Diagnosis not present

## 2019-09-22 DIAGNOSIS — R809 Proteinuria, unspecified: Secondary | ICD-10-CM | POA: Diagnosis not present

## 2019-09-22 DIAGNOSIS — M8580 Other specified disorders of bone density and structure, unspecified site: Secondary | ICD-10-CM | POA: Diagnosis not present

## 2019-09-22 DIAGNOSIS — Z0000 Encounter for general adult medical examination without abnormal findings: Secondary | ICD-10-CM | POA: Diagnosis not present

## 2019-09-22 DIAGNOSIS — H409 Unspecified glaucoma: Secondary | ICD-10-CM | POA: Diagnosis not present

## 2019-09-22 DIAGNOSIS — M6281 Muscle weakness (generalized): Secondary | ICD-10-CM | POA: Diagnosis not present

## 2019-09-24 DIAGNOSIS — S42202D Unspecified fracture of upper end of left humerus, subsequent encounter for fracture with routine healing: Secondary | ICD-10-CM | POA: Diagnosis not present

## 2019-09-24 DIAGNOSIS — M25612 Stiffness of left shoulder, not elsewhere classified: Secondary | ICD-10-CM | POA: Diagnosis not present

## 2019-09-24 DIAGNOSIS — M6281 Muscle weakness (generalized): Secondary | ICD-10-CM | POA: Diagnosis not present

## 2019-09-24 DIAGNOSIS — M25512 Pain in left shoulder: Secondary | ICD-10-CM | POA: Diagnosis not present

## 2019-09-25 DIAGNOSIS — Z45018 Encounter for adjustment and management of other part of cardiac pacemaker: Secondary | ICD-10-CM | POA: Diagnosis not present

## 2019-09-25 DIAGNOSIS — Z950 Presence of cardiac pacemaker: Secondary | ICD-10-CM | POA: Diagnosis not present

## 2019-09-25 DIAGNOSIS — I442 Atrioventricular block, complete: Secondary | ICD-10-CM | POA: Diagnosis not present

## 2019-09-29 ENCOUNTER — Ambulatory Visit: Payer: Medicare Other | Admitting: Psychology

## 2019-09-29 DIAGNOSIS — M25612 Stiffness of left shoulder, not elsewhere classified: Secondary | ICD-10-CM | POA: Diagnosis not present

## 2019-09-29 DIAGNOSIS — M6281 Muscle weakness (generalized): Secondary | ICD-10-CM | POA: Diagnosis not present

## 2019-09-29 DIAGNOSIS — S42202D Unspecified fracture of upper end of left humerus, subsequent encounter for fracture with routine healing: Secondary | ICD-10-CM | POA: Diagnosis not present

## 2019-09-29 DIAGNOSIS — M25512 Pain in left shoulder: Secondary | ICD-10-CM | POA: Diagnosis not present

## 2019-09-30 ENCOUNTER — Telehealth: Payer: Self-pay | Admitting: Cardiology

## 2019-10-01 DIAGNOSIS — S42202D Unspecified fracture of upper end of left humerus, subsequent encounter for fracture with routine healing: Secondary | ICD-10-CM | POA: Diagnosis not present

## 2019-10-01 DIAGNOSIS — M6281 Muscle weakness (generalized): Secondary | ICD-10-CM | POA: Diagnosis not present

## 2019-10-01 DIAGNOSIS — M25512 Pain in left shoulder: Secondary | ICD-10-CM | POA: Diagnosis not present

## 2019-10-01 DIAGNOSIS — M25612 Stiffness of left shoulder, not elsewhere classified: Secondary | ICD-10-CM | POA: Diagnosis not present

## 2019-10-01 NOTE — Telephone Encounter (Signed)
Called patient, NA, LMAM

## 2019-10-04 NOTE — Telephone Encounter (Signed)
Pt returned call, made aware of results.//ah

## 2019-10-06 DIAGNOSIS — G4489 Other headache syndrome: Secondary | ICD-10-CM | POA: Diagnosis not present

## 2019-10-06 DIAGNOSIS — G894 Chronic pain syndrome: Secondary | ICD-10-CM | POA: Diagnosis not present

## 2019-10-06 DIAGNOSIS — M50321 Other cervical disc degeneration at C4-C5 level: Secondary | ICD-10-CM | POA: Diagnosis not present

## 2019-10-12 DIAGNOSIS — I10 Essential (primary) hypertension: Secondary | ICD-10-CM | POA: Diagnosis not present

## 2019-10-28 DIAGNOSIS — S42202D Unspecified fracture of upper end of left humerus, subsequent encounter for fracture with routine healing: Secondary | ICD-10-CM | POA: Diagnosis not present

## 2019-10-28 DIAGNOSIS — M25612 Stiffness of left shoulder, not elsewhere classified: Secondary | ICD-10-CM | POA: Diagnosis not present

## 2019-10-28 DIAGNOSIS — M6281 Muscle weakness (generalized): Secondary | ICD-10-CM | POA: Diagnosis not present

## 2019-10-28 DIAGNOSIS — M25512 Pain in left shoulder: Secondary | ICD-10-CM | POA: Diagnosis not present

## 2019-11-03 DIAGNOSIS — M6281 Muscle weakness (generalized): Secondary | ICD-10-CM | POA: Diagnosis not present

## 2019-11-03 DIAGNOSIS — M25612 Stiffness of left shoulder, not elsewhere classified: Secondary | ICD-10-CM | POA: Diagnosis not present

## 2019-11-03 DIAGNOSIS — S42202D Unspecified fracture of upper end of left humerus, subsequent encounter for fracture with routine healing: Secondary | ICD-10-CM | POA: Diagnosis not present

## 2019-11-03 DIAGNOSIS — M25512 Pain in left shoulder: Secondary | ICD-10-CM | POA: Diagnosis not present

## 2019-11-05 DIAGNOSIS — S42202D Unspecified fracture of upper end of left humerus, subsequent encounter for fracture with routine healing: Secondary | ICD-10-CM | POA: Diagnosis not present

## 2019-11-05 DIAGNOSIS — M25512 Pain in left shoulder: Secondary | ICD-10-CM | POA: Diagnosis not present

## 2019-11-05 DIAGNOSIS — M25612 Stiffness of left shoulder, not elsewhere classified: Secondary | ICD-10-CM | POA: Diagnosis not present

## 2019-11-05 DIAGNOSIS — M6281 Muscle weakness (generalized): Secondary | ICD-10-CM | POA: Diagnosis not present

## 2019-11-10 ENCOUNTER — Ambulatory Visit: Payer: Medicare Other | Admitting: Psychology

## 2019-11-10 DIAGNOSIS — M25512 Pain in left shoulder: Secondary | ICD-10-CM | POA: Diagnosis not present

## 2019-11-10 DIAGNOSIS — M25612 Stiffness of left shoulder, not elsewhere classified: Secondary | ICD-10-CM | POA: Diagnosis not present

## 2019-11-10 DIAGNOSIS — S42202D Unspecified fracture of upper end of left humerus, subsequent encounter for fracture with routine healing: Secondary | ICD-10-CM | POA: Diagnosis not present

## 2019-11-10 DIAGNOSIS — M6281 Muscle weakness (generalized): Secondary | ICD-10-CM | POA: Diagnosis not present

## 2019-11-12 DIAGNOSIS — M25612 Stiffness of left shoulder, not elsewhere classified: Secondary | ICD-10-CM | POA: Diagnosis not present

## 2019-11-12 DIAGNOSIS — S42202D Unspecified fracture of upper end of left humerus, subsequent encounter for fracture with routine healing: Secondary | ICD-10-CM | POA: Diagnosis not present

## 2019-11-12 DIAGNOSIS — M25512 Pain in left shoulder: Secondary | ICD-10-CM | POA: Diagnosis not present

## 2019-11-12 DIAGNOSIS — M6281 Muscle weakness (generalized): Secondary | ICD-10-CM | POA: Diagnosis not present

## 2019-11-16 DIAGNOSIS — M25612 Stiffness of left shoulder, not elsewhere classified: Secondary | ICD-10-CM | POA: Diagnosis not present

## 2019-11-16 DIAGNOSIS — S42202D Unspecified fracture of upper end of left humerus, subsequent encounter for fracture with routine healing: Secondary | ICD-10-CM | POA: Diagnosis not present

## 2019-11-16 DIAGNOSIS — M6281 Muscle weakness (generalized): Secondary | ICD-10-CM | POA: Diagnosis not present

## 2019-11-18 DIAGNOSIS — M6281 Muscle weakness (generalized): Secondary | ICD-10-CM | POA: Diagnosis not present

## 2019-11-18 DIAGNOSIS — M25512 Pain in left shoulder: Secondary | ICD-10-CM | POA: Diagnosis not present

## 2019-11-18 DIAGNOSIS — M25612 Stiffness of left shoulder, not elsewhere classified: Secondary | ICD-10-CM | POA: Diagnosis not present

## 2019-11-18 DIAGNOSIS — S42202D Unspecified fracture of upper end of left humerus, subsequent encounter for fracture with routine healing: Secondary | ICD-10-CM | POA: Diagnosis not present

## 2019-11-22 DIAGNOSIS — M6281 Muscle weakness (generalized): Secondary | ICD-10-CM | POA: Diagnosis not present

## 2019-11-22 DIAGNOSIS — M25512 Pain in left shoulder: Secondary | ICD-10-CM | POA: Diagnosis not present

## 2019-11-22 DIAGNOSIS — M25612 Stiffness of left shoulder, not elsewhere classified: Secondary | ICD-10-CM | POA: Diagnosis not present

## 2019-11-22 DIAGNOSIS — S42202D Unspecified fracture of upper end of left humerus, subsequent encounter for fracture with routine healing: Secondary | ICD-10-CM | POA: Diagnosis not present

## 2019-11-24 DIAGNOSIS — S42202D Unspecified fracture of upper end of left humerus, subsequent encounter for fracture with routine healing: Secondary | ICD-10-CM | POA: Diagnosis not present

## 2019-11-24 DIAGNOSIS — M25612 Stiffness of left shoulder, not elsewhere classified: Secondary | ICD-10-CM | POA: Diagnosis not present

## 2019-11-24 DIAGNOSIS — M6281 Muscle weakness (generalized): Secondary | ICD-10-CM | POA: Diagnosis not present

## 2019-11-24 DIAGNOSIS — M25512 Pain in left shoulder: Secondary | ICD-10-CM | POA: Diagnosis not present

## 2019-11-29 DIAGNOSIS — M25612 Stiffness of left shoulder, not elsewhere classified: Secondary | ICD-10-CM | POA: Diagnosis not present

## 2019-11-29 DIAGNOSIS — M25512 Pain in left shoulder: Secondary | ICD-10-CM | POA: Diagnosis not present

## 2019-11-29 DIAGNOSIS — S42202D Unspecified fracture of upper end of left humerus, subsequent encounter for fracture with routine healing: Secondary | ICD-10-CM | POA: Diagnosis not present

## 2019-11-29 DIAGNOSIS — M6281 Muscle weakness (generalized): Secondary | ICD-10-CM | POA: Diagnosis not present

## 2019-12-01 DIAGNOSIS — S42202D Unspecified fracture of upper end of left humerus, subsequent encounter for fracture with routine healing: Secondary | ICD-10-CM | POA: Diagnosis not present

## 2019-12-01 DIAGNOSIS — M6281 Muscle weakness (generalized): Secondary | ICD-10-CM | POA: Diagnosis not present

## 2019-12-01 DIAGNOSIS — M25612 Stiffness of left shoulder, not elsewhere classified: Secondary | ICD-10-CM | POA: Diagnosis not present

## 2019-12-06 DIAGNOSIS — M25612 Stiffness of left shoulder, not elsewhere classified: Secondary | ICD-10-CM | POA: Diagnosis not present

## 2019-12-06 DIAGNOSIS — S42202D Unspecified fracture of upper end of left humerus, subsequent encounter for fracture with routine healing: Secondary | ICD-10-CM | POA: Diagnosis not present

## 2019-12-06 DIAGNOSIS — M25512 Pain in left shoulder: Secondary | ICD-10-CM | POA: Diagnosis not present

## 2019-12-06 DIAGNOSIS — M6281 Muscle weakness (generalized): Secondary | ICD-10-CM | POA: Diagnosis not present

## 2019-12-07 DIAGNOSIS — H2511 Age-related nuclear cataract, right eye: Secondary | ICD-10-CM | POA: Diagnosis not present

## 2019-12-07 DIAGNOSIS — H04123 Dry eye syndrome of bilateral lacrimal glands: Secondary | ICD-10-CM | POA: Diagnosis not present

## 2019-12-07 DIAGNOSIS — Z961 Presence of intraocular lens: Secondary | ICD-10-CM | POA: Diagnosis not present

## 2019-12-07 DIAGNOSIS — H35372 Puckering of macula, left eye: Secondary | ICD-10-CM | POA: Diagnosis not present

## 2019-12-07 DIAGNOSIS — H401132 Primary open-angle glaucoma, bilateral, moderate stage: Secondary | ICD-10-CM | POA: Diagnosis not present

## 2019-12-09 DIAGNOSIS — M25512 Pain in left shoulder: Secondary | ICD-10-CM | POA: Diagnosis not present

## 2019-12-09 DIAGNOSIS — S42202D Unspecified fracture of upper end of left humerus, subsequent encounter for fracture with routine healing: Secondary | ICD-10-CM | POA: Diagnosis not present

## 2019-12-09 DIAGNOSIS — M6281 Muscle weakness (generalized): Secondary | ICD-10-CM | POA: Diagnosis not present

## 2019-12-09 DIAGNOSIS — M25612 Stiffness of left shoulder, not elsewhere classified: Secondary | ICD-10-CM | POA: Diagnosis not present

## 2019-12-15 DIAGNOSIS — M25612 Stiffness of left shoulder, not elsewhere classified: Secondary | ICD-10-CM | POA: Diagnosis not present

## 2019-12-15 DIAGNOSIS — M25512 Pain in left shoulder: Secondary | ICD-10-CM | POA: Diagnosis not present

## 2019-12-15 DIAGNOSIS — M6281 Muscle weakness (generalized): Secondary | ICD-10-CM | POA: Diagnosis not present

## 2019-12-15 DIAGNOSIS — S42202D Unspecified fracture of upper end of left humerus, subsequent encounter for fracture with routine healing: Secondary | ICD-10-CM | POA: Diagnosis not present

## 2019-12-17 DIAGNOSIS — S42202D Unspecified fracture of upper end of left humerus, subsequent encounter for fracture with routine healing: Secondary | ICD-10-CM | POA: Diagnosis not present

## 2019-12-17 DIAGNOSIS — M6281 Muscle weakness (generalized): Secondary | ICD-10-CM | POA: Diagnosis not present

## 2019-12-17 DIAGNOSIS — M25512 Pain in left shoulder: Secondary | ICD-10-CM | POA: Diagnosis not present

## 2019-12-17 DIAGNOSIS — M25612 Stiffness of left shoulder, not elsewhere classified: Secondary | ICD-10-CM | POA: Diagnosis not present

## 2019-12-21 DIAGNOSIS — M6281 Muscle weakness (generalized): Secondary | ICD-10-CM | POA: Diagnosis not present

## 2019-12-21 DIAGNOSIS — S42202D Unspecified fracture of upper end of left humerus, subsequent encounter for fracture with routine healing: Secondary | ICD-10-CM | POA: Diagnosis not present

## 2019-12-21 DIAGNOSIS — M25512 Pain in left shoulder: Secondary | ICD-10-CM | POA: Diagnosis not present

## 2019-12-21 DIAGNOSIS — M25612 Stiffness of left shoulder, not elsewhere classified: Secondary | ICD-10-CM | POA: Diagnosis not present

## 2020-01-03 ENCOUNTER — Encounter: Payer: Self-pay | Admitting: Emergency Medicine

## 2020-01-03 ENCOUNTER — Other Ambulatory Visit: Payer: Self-pay

## 2020-01-03 ENCOUNTER — Ambulatory Visit
Admission: EM | Admit: 2020-01-03 | Discharge: 2020-01-03 | Disposition: A | Discharge status: Home / Self Care | Payer: Medicare Other

## 2020-01-03 DIAGNOSIS — B9689 Other specified bacterial agents as the cause of diseases classified elsewhere: Secondary | ICD-10-CM | POA: Diagnosis not present

## 2020-01-03 DIAGNOSIS — L089 Local infection of the skin and subcutaneous tissue, unspecified: Secondary | ICD-10-CM

## 2020-01-03 DIAGNOSIS — M25471 Effusion, right ankle: Secondary | ICD-10-CM

## 2020-01-03 MED ORDER — TRIAMCINOLONE ACETONIDE 0.1 % EX CREA: 1.0000 "application " | TOPICAL_CREAM | Freq: Two times a day (BID) | CUTANEOUS | 0 refills | Status: DC

## 2020-01-03 MED ORDER — DOXYCYCLINE HYCLATE 100 MG PO CAPS: 100.0000 mg | ORAL_CAPSULE | Freq: Two times a day (BID) | ORAL | 0 refills | Status: AC

## 2020-01-03 NOTE — ED Triage Notes (Addendum)
concerned for insect bite on right foot.  Patient noticed this place on right foot this morning after being in her garage.  Patient noticed veins in foot appearing engorged.   Patient has referred frequently to putting onion on this as a remedy.    Patient is concerned for red area around a darker center, bruised appearance.  No pain.    Reports a fall on steps on Friday or saturday

## 2020-01-05 DIAGNOSIS — H35352 Cystoid macular degeneration, left eye: Secondary | ICD-10-CM | POA: Diagnosis not present

## 2020-01-05 DIAGNOSIS — H35372 Puckering of macula, left eye: Secondary | ICD-10-CM | POA: Diagnosis not present

## 2020-01-05 DIAGNOSIS — H401132 Primary open-angle glaucoma, bilateral, moderate stage: Secondary | ICD-10-CM | POA: Diagnosis not present

## 2020-01-05 DIAGNOSIS — H2511 Age-related nuclear cataract, right eye: Secondary | ICD-10-CM | POA: Diagnosis not present

## 2020-01-05 NOTE — ED Provider Notes (Signed)
[facility]    CSN: [id number] Arrival date & time: 01/03/20  1946      History   Chief Complaint Chief Complaint  Patient presents with  . Insect Bite    HPI Charlene Barker is a 74 y.o. female.   HPI Patient presents with a concern for an insect bite while working in garage earlier today. She reports two areas top of her right  foot that red and irritated. Since noticing the red area of top of the right foot, she endorses right ankle swelling. Denies any leg pain or leg swelling. Area is non painful only slight itchy. Report fullness felling in her throat earlier today which resolved after drinking water. Uncertain if this was an reaction from the insect bite.  Past Medical History:  Diagnosis Date  . Anxiety   . Atrioventricular block, complete -intermittent       . Complete heart block ([place]) 04/14/2012  . Drug-induced hyperkalemia -associated with Aldactone   . Encounter for care of pacemaker 10/27/2018  . GERD (gastroesophageal reflux disease)   . Glaucoma    bilateral  . HTN (hypertension)   . Left bundle branch block   . Lightheadedness    Associated with exercise  . 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 Pacemaker    St Jude  . Paresthesia of both legs 03/02/2015  . Rheumatoid arthritis ([place])   . Sinus node dysfunction ([place]) 02/17/2019  . Spinal stenosis of lumbar region 03/02/2015   L4-5  . Syncope     Patient Active Problem List   Diagnosis Date Noted  . Sinus node dysfunction ([place]) 02/17/2019  . Palpitations 02/17/2019  . Encounter for care of pacemaker 10/27/2018  . Tremor 07/18/2016  . Paresthesia 03/02/2015  . Spinal stenosis of lumbar region 03/02/2015  . Other specified transient cerebral ischemias 12/27/2014  . 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 Anxiety 08/13/2012  . Pacemaker--St Jude Accent DR 2110 dual chamber pacemaker12/18/2013 04/15/2012  . Complete heart block ([place]) 04/14/2012  . Syncope   . Left bundle  branch block   . Lightheadedness     Past Surgical History:  Procedure Laterality Date  . BLADDER SURGERY     sling  . EYE SURGERY     several, bilateral  . LOOP RECORDER IMPLANT N/A 09/20/2011   Procedure: LOOP RECORDER IMPLANT;  Surgeon: Deboraha Sprang, MD;  Location: [facility] CATH LAB;  Service: Cardiovascular;  Laterality: N/A;  . PERMANENT PACEMAKER INSERTION N/A 04/15/2012    OB History   No obstetric history on file.      Home Medications    Prior to Admission medications   Medication Sig Start Date End Date Taking? Authorizing Provider  amLODipine (NORVASC) 2.5 MG tablet SMARTSIG:1 Tablet(s) By Mouth Every Evening 11/24/19  Yes [provider]  acetaminophen (TYLENOL) 650 MG CR tablet Take 650 mg by mouth daily as needed (for headaches, discomfort or back pain).    [provider]  aspirin EC 81 MG tablet Take 81 mg by mouth every other day.    [provider]  AZO-CRANBERRY PO Take by mouth daily.    [provider]  Cholecalciferol (VITAMIN D3 HIGH POTENCY PO) Take 1,000 mcg by mouth daily.     [provider]  DORZOLAMIDE HCL OP Apply to eye daily.    [provider]  doxycycline (VIBRAMYCIN) 100 MG capsule Take 1 capsule (100 mg total) by mouth 2 (two) times daily for 5 days.  01/03/20 01/08/20  Scot Jun, FNP  Flaxseed Oil OIL by Does not apply route daily.    [provider]  guaiFENesin (MUCINEX) 600 MG 12 hr tablet Take by mouth continuous as needed.    [provider]  HYDROcodone-acetaminophen (NORCO/VICODIN) 5-325 MG tablet Take either a half of a pill or a whole pill every 4-6 hours for pain 07/27/19   Milton Ferguson, MD  latanoprost (XALATAN) 0.005 % ophthalmic solution 1 drop at bedtime.    [provider]  meclizine (ANTIVERT) 25 MG tablet Take 25 mg by mouth 3 (three) times daily as needed. For dizziness/vertigo    [provider]  Nutritional Supplements (JUICE PLUS  FIBRE PO) Take by mouth 2 (two) times daily.    [provider]  triamcinolone cream (KENALOG) 0.1 % Apply 1 application topically 2 (two) times daily. 01/03/20   Scot Jun, FNP    Family History Family History  Problem Relation Age of Onset  . Leukemia Mother   . Stroke Father   . Heart failure Father   . Heart attack Father   . Heart disease Father   . Diabetes Sister   . Asthma Brother   . Restless legs syndrome Brother     Social History Social History   Tobacco Use  . Smoking status: Never Smoker  . Smokeless tobacco: Never Used  Vaping Use  . Vaping Use: Never used  Substance Use Topics  . Alcohol use: Not Currently    Comment: wine only at christmas time  . Drug use: No     Allergies   Codeine, Contrast media [iodinated diagnostic agents], Penicillins, Sulfa drugs cross reactors, Chlorhexidine, Latex, and Povidone   Review of Systems Review of Systems Pertinent negatives listed in HPI  Physical Exam Triage Vital Signs ED Triage Vitals  Enc Vitals Group     BP 01/03/20 2103 (Abnormal) 159/71     Pulse Rate 01/03/20 2103 71     Resp 01/03/20 2103 20     Temp 01/03/20 2103 98.2 F (36.8 C)     Temp Source 01/03/20 2103 Oral     SpO2 01/03/20 2103 99 %     Weight --      Height --      Head Circumference --      Peak Flow --      Pain Score 01/03/20 2059 0     Pain Loc --      Pain Edu? --      Excl. in [place]? --    No data found.  Updated Vital Signs Blood Pressure (Abnormal) 159/71 (BP Location: Right Arm)   Pulse 71   Temperature 98.2 F (36.8 C) (Oral)   Respiration 20   Oxygen Saturation 99%   Visual Acuity Right Eye Distance:   Left Eye Distance:   Bilateral Distance:    Right Eye Near:   Left Eye Near:    Bilateral Near:     Physical Exam   UC Treatments / Results  Labs (all labs ordered are listed, but only abnormal results are displayed) Labs Reviewed - No data to display  EKG   Radiology No results  found.  Procedures Procedures (including critical care time)  Medications Ordered in UC Medications - No data to display  Initial Impression / Assessment and Plan / UC Course  I have reviewed the triage vital signs and the nursing notes.  Pertinent labs & imaging results that were available during my care of  the patient were reviewed by me and considered in my medical decision making (see chart for details).    Treating for a superficial bacterial skin infection per orders. Right ankle swelling recommend reducing sodium intake, elevation,and compression socks. Encouraged follow-up with PCP regarding ankle swelling appears to be chronic. An After Visit Summary was printed and given to the patient/family. Precautions discussed. Red flags discussed.Questions invited and answered. They voiced understanding and agreement. Final Clinical Impressions(s) / UC Diagnoses   Final diagnoses:  Superficial bacterial skin infection  Right ankle swelling   Discharge Instructions   None    ED Prescriptions    Medication Sig Dispense Auth. Provider   triamcinolone cream (KENALOG) 0.1 % Apply 1 application topically 2 (two) times daily. 30 g Scot Jun, FNP   doxycycline (VIBRAMYCIN) 100 MG capsule Take 1 capsule (100 mg total) by mouth 2 (two) times daily for 5 days. 10 capsule Scot Jun, FNP     PDMP not reviewed this encounter.   Scot Jun, FNP 01/07/20 [phone]

## 2020-01-13 DIAGNOSIS — G4489 Other headache syndrome: Secondary | ICD-10-CM | POA: Diagnosis not present

## 2020-01-13 DIAGNOSIS — G894 Chronic pain syndrome: Secondary | ICD-10-CM | POA: Diagnosis not present

## 2020-01-13 DIAGNOSIS — M50321 Other cervical disc degeneration at C4-C5 level: Secondary | ICD-10-CM | POA: Diagnosis not present

## 2020-02-02 DIAGNOSIS — G894 Chronic pain syndrome: Secondary | ICD-10-CM | POA: Diagnosis not present

## 2020-02-02 DIAGNOSIS — M50321 Other cervical disc degeneration at C4-C5 level: Secondary | ICD-10-CM | POA: Diagnosis not present

## 2020-02-04 DIAGNOSIS — E1169 Type 2 diabetes mellitus with other specified complication: Secondary | ICD-10-CM | POA: Diagnosis not present

## 2020-02-04 DIAGNOSIS — F329 Major depressive disorder, single episode, unspecified: Secondary | ICD-10-CM | POA: Diagnosis not present

## 2020-02-04 DIAGNOSIS — E785 Hyperlipidemia, unspecified: Secondary | ICD-10-CM | POA: Diagnosis not present

## 2020-02-04 DIAGNOSIS — R945 Abnormal results of liver function studies: Secondary | ICD-10-CM | POA: Diagnosis not present

## 2020-02-04 DIAGNOSIS — I442 Atrioventricular block, complete: Secondary | ICD-10-CM | POA: Diagnosis not present

## 2020-02-04 DIAGNOSIS — G459 Transient cerebral ischemic attack, unspecified: Secondary | ICD-10-CM | POA: Diagnosis not present

## 2020-02-04 DIAGNOSIS — I10 Essential (primary) hypertension: Secondary | ICD-10-CM | POA: Diagnosis not present

## 2020-02-04 DIAGNOSIS — Z8673 Personal history of transient ischemic attack (TIA), and cerebral infarction without residual deficits: Secondary | ICD-10-CM | POA: Diagnosis not present

## 2020-02-04 DIAGNOSIS — Z950 Presence of cardiac pacemaker: Secondary | ICD-10-CM | POA: Diagnosis not present

## 2020-02-04 DIAGNOSIS — M542 Cervicalgia: Secondary | ICD-10-CM | POA: Diagnosis not present

## 2020-02-04 DIAGNOSIS — M8580 Other specified disorders of bone density and structure, unspecified site: Secondary | ICD-10-CM | POA: Diagnosis not present

## 2020-02-08 DIAGNOSIS — Z961 Presence of intraocular lens: Secondary | ICD-10-CM | POA: Diagnosis not present

## 2020-02-08 DIAGNOSIS — H04123 Dry eye syndrome of bilateral lacrimal glands: Secondary | ICD-10-CM | POA: Diagnosis not present

## 2020-02-08 DIAGNOSIS — H2511 Age-related nuclear cataract, right eye: Secondary | ICD-10-CM | POA: Diagnosis not present

## 2020-02-08 DIAGNOSIS — H401132 Primary open-angle glaucoma, bilateral, moderate stage: Secondary | ICD-10-CM | POA: Diagnosis not present

## 2020-02-11 DIAGNOSIS — G894 Chronic pain syndrome: Secondary | ICD-10-CM | POA: Diagnosis not present

## 2020-02-14 ENCOUNTER — Other Ambulatory Visit: Payer: Self-pay | Admitting: Internal Medicine

## 2020-02-14 DIAGNOSIS — M8580 Other specified disorders of bone density and structure, unspecified site: Secondary | ICD-10-CM

## 2020-02-15 ENCOUNTER — Ambulatory Visit
Admission: RE | Admit: 2020-02-15 | Discharge: 2020-02-15 | Disposition: A | Discharge status: Home / Self Care | Payer: Medicare Other | Source: Ambulatory Visit | Attending: Internal Medicine | Admitting: Internal Medicine

## 2020-02-15 ENCOUNTER — Other Ambulatory Visit: Payer: Self-pay

## 2020-02-15 DIAGNOSIS — G894 Chronic pain syndrome: Secondary | ICD-10-CM | POA: Diagnosis not present

## 2020-02-15 DIAGNOSIS — G4489 Other headache syndrome: Secondary | ICD-10-CM | POA: Diagnosis not present

## 2020-02-15 DIAGNOSIS — Z780 Asymptomatic menopausal state: Secondary | ICD-10-CM | POA: Diagnosis not present

## 2020-02-15 DIAGNOSIS — M8580 Other specified disorders of bone density and structure, unspecified site: Secondary | ICD-10-CM

## 2020-02-15 DIAGNOSIS — M50321 Other cervical disc degeneration at C4-C5 level: Secondary | ICD-10-CM | POA: Diagnosis not present

## 2020-02-15 DIAGNOSIS — M85851 Other specified disorders of bone density and structure, right thigh: Secondary | ICD-10-CM | POA: Diagnosis not present

## 2020-02-17 ENCOUNTER — Encounter: Payer: Self-pay | Admitting: Cardiology

## 2020-02-17 ENCOUNTER — Ambulatory Visit: Payer: Medicare Other | Admitting: Cardiology

## 2020-02-17 ENCOUNTER — Other Ambulatory Visit: Payer: Self-pay

## 2020-02-17 VITALS — BP 126/70 | HR 64 | Resp 15 | Ht 64.0 in | Wt 118.0 lb

## 2020-02-17 DIAGNOSIS — E785 Hyperlipidemia, unspecified: Secondary | ICD-10-CM

## 2020-02-17 DIAGNOSIS — Z45018 Encounter for adjustment and management of other part of cardiac pacemaker: Secondary | ICD-10-CM | POA: Diagnosis not present

## 2020-02-17 DIAGNOSIS — I442 Atrioventricular block, complete: Secondary | ICD-10-CM | POA: Diagnosis not present

## 2020-02-17 DIAGNOSIS — I447 Left bundle-branch block, unspecified: Secondary | ICD-10-CM | POA: Diagnosis not present

## 2020-02-17 DIAGNOSIS — Z950 Presence of cardiac pacemaker: Secondary | ICD-10-CM

## 2020-02-17 DIAGNOSIS — Z8673 Personal history of transient ischemic attack (TIA), and cerebral infarction without residual deficits: Secondary | ICD-10-CM | POA: Diagnosis not present

## 2020-02-17 NOTE — Progress Notes (Signed)
Subjective:  Primary Physician:  Crist Infante, MD  Patient ID: Charlene Barker, female    DOB: 04-Dec-1945, 74 y.o.   MRN: [id number]  Chief Complaint  Patient presents with  . Follow-up    1 year  . Pacemaker Check  . Loss of Consciousness    HPI: Khalil Belote  is a 74 y.o. female  with sinus node dysfunction and also high degree AV block and recurrent syncope leading to permanent pacemaker implantation on 04/15/2012.  Her past medical history is significant for hypertension, benign positional vertigo, left bundle branch block, prediabetes mellitus. She also has history of vertigo but this is different from her dizzy spells.  She was noted to have a small lacunar stroke in the left internal capsule in 2016.  She is presently doing well, no chest pain, she has not had any syncope or palpitations, no dyspnea.  Continues to have balance issues but no fall.  Presents here for annual visit and pacemaker check.  Past Medical History:  Diagnosis Date  . Anxiety   . Atrioventricular block, complete -intermittent       . Complete heart block ([place]) 04/14/2012  . Diabetes mellitus without complication ([place])    per patient.   . Drug-induced hyperkalemia -associated with Aldactone   . Encounter for care of pacemaker 10/27/2018  . GERD (gastroesophageal reflux disease)   . Glaucoma    bilateral  . HTN (hypertension)   . Left bundle branch block   . Lightheadedness    Associated with exercise  . 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 Pacemaker    St Jude  . Paresthesia of both legs 03/02/2015  . Rheumatoid arthritis ([place])   . Sinus node dysfunction ([place]) 02/17/2019  . Spinal stenosis of lumbar region 03/02/2015   L4-5  . Syncope     Past Surgical History:  Procedure Laterality Date  . BLADDER SURGERY     sling  . EYE SURGERY     several, bilateral  . LOOP RECORDER IMPLANT N/A 09/20/2011   Procedure: LOOP RECORDER IMPLANT;  Surgeon: Deboraha Sprang, MD;   Location: [facility] CATH LAB;  Service: Cardiovascular;  Laterality: N/A;  . PERMANENT PACEMAKER INSERTION N/A 04/15/2012   Social History   Tobacco Use  . Smoking status: Never Smoker  . Smokeless tobacco: Never Used  Substance Use Topics  . Alcohol use: Not Currently    Comment: wine only at christmas time  Marital Status: Married    Current Outpatient Medications on File Prior to Visit  Medication Sig Dispense Refill  . acetaminophen (TYLENOL) 650 MG CR tablet Take 650 mg by mouth daily as needed (for headaches, discomfort or back pain).    Marland Kitchen amLODipine (NORVASC) 2.5 MG tablet Every morning    . aspirin EC 81 MG tablet Take 81 mg by mouth every other day.    . AZO-CRANBERRY PO Take by mouth daily.    . Cholecalciferol (VITAMIN D3 HIGH POTENCY PO) Take 1,000 mcg by mouth daily.     Marland Kitchen CINNAMON PO Take by mouth.    . DORZOLAMIDE HCL OP Apply to eye daily.    . Flaxseed Oil OIL by Does not apply route daily.    Marland Kitchen guaiFENesin (MUCINEX) 600 MG 12 hr tablet Take by mouth continuous as needed.    . latanoprost (XALATAN) 0.005 % ophthalmic solution 1 drop at bedtime.    . meclizine (ANTIVERT) 25 MG tablet Take 25 mg by mouth 3 (three) times daily  as needed. For dizziness/vertigo    . Nutritional Supplements (JUICE PLUS FIBRE PO) Take by mouth 2 (two) times daily.     No current facility-administered medications on file prior to visit.   Review of Systems  Cardiovascular: Negative for chest pain, dyspnea on exertion and leg swelling.  Gastrointestinal: Negative for melena.  Neurological: Positive for disturbances in coordination.  Psychiatric/Behavioral: Positive for depression and hypervigilance. The patient is nervous/anxious.     Objective:  Blood pressure 126/70, pulse 64, resp. rate 15, height 5\' 4"  (1.626 m), weight 118 lb (53.5 kg), SpO2 99 %. Body mass index is 20.25 kg/m.  Vitals with BMI 02/17/2020 01/03/2020 07/27/2019  Height 5\' 4"  - -  Weight 118 lbs - -  BMI 16.10 - -   Systolic 960 454 098  Diastolic 70 71 75  Pulse 64 71 66    Physical Exam Constitutional:      General: She is not in acute distress.    Appearance: She is well-developed.  Neck:     Thyroid: No thyromegaly.     Vascular: No JVD.  Cardiovascular:     Rate and Rhythm: Normal rate and regular rhythm.     Pulses: Intact distal pulses.     Heart sounds: Normal heart sounds. No murmur heard.  No gallop.   Pulmonary:     Effort: Pulmonary effort is normal.     Breath sounds: Normal breath sounds.  Abdominal:     General: Bowel sounds are normal.     Palpations: Abdomen is soft.  Musculoskeletal:        General: Normal range of motion.     Cervical back: Neck supple.  Skin:    General: Skin is warm and dry.  Neurological:     Mental Status: She is alert.    Radiology:   CT Head 07/16/2016: No change from 01/06/2015 (small vessel disease).  1. No acute intracranial findings. 2. 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  Labs:   External Labs:  Cholesterol, total 182.000 m 09/15/2019 HDL 57 MG/DL 09/15/2019 LDL 106.000 m 09/15/2019 Triglycerides 93.000 09/15/2019  A1C 6.300 % 09/15/2019 TSH 1.550 09/15/2019  Hemoglobin 14.200 g/d 09/15/2019  Creatinine, Serum 0.800 mg/ 09/15/2019 Potassium 4.100 mm 07/16/2016 Magnesium N/D ALT (SGPT) 47.000 uni 09/15/2019  CARDIAC STUDIES:   Echocardiogram 05/27/2018: Left ventricle cavity is normal in size. Mild concentric hypertrophy of the left ventricle. Abnormal septal wall motion due to left bundle branch block or paced rhythm. Calculated EF 50%. Mild (Grade I) mitral regurgitation. Moderate tricuspid regurgitation. Estimated pulmonary artery systolic pressure 22  mmHg. Compared to previous study in 2013, pacemaker, mitral and tricuspid regurgitation are new findings.  Lexiscan stress 02/07/12: Normal perfusion. No ischemia. Normal LVEF.  Device check: Pacemaker--St Jude Accent DR 2110 dual chamber pacemaker  04/15/2012   Scheduled  In office pacemaker check 02/17/20  Single (S)/Dual (D)/BV: D. Presenting ASVS. Sinus @ 60/min Pacemaker dependant:  No. Underlying NSR. AP 1.4%, VP <1%.. AMS Episodes 3.  EGM suggest noise. HVR 0. Longevity 8 Years. Magnet rate: >85%. Lead measurements: Stable. Histogram: Low (L)/normal (N)/high (H)  Normal. Patient activity Active.   Observations: Normal pacemaker function. Changes: None.   EKG:     EKG 08/25/2017: Normal sinus rhythm at rate of 62 bpm, left bundle branch block.no further analysis  Assessment:   1. Encounter for care of pacemaker   2. Pacemaker--St Jude Accent DR 2110 dual chamber pacemaker12/18/2013   3. Complete heart block ([place])  4. Left bundle branch block   5. Mild hyperlipidemia   6. 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  Medications Discontinued During This Encounter  Medication Reason  . HYDROcodone-acetaminophen (NORCO/VICODIN) 5-325 MG tablet No longer needed (for PRN medications)  . triamcinolone cream (KENALOG) 0.1 % Patient Preference      Recommendations:   HPI: JILIAN WEST  is a 75 y.o. female  with sinus node dysfunction and also high degree AV block and recurrent syncope leading to permanent pacemaker implantation on 04/15/2012. Her past medical history is significant for hypertension, benign positional vertigo, left bundle branch block, prediabetes mellitus. She also has history of vertigo but this is different from her dizzy spells.  She has had a small stroke in the left internal capsule that was noted in 2016.  Except for continued balance issues, she has no specific complaints today.  No syncope, no further palpitations.  I reviewed her pacemaker function, normal pacemaker function.  She is not pacer dependent.  Blood pressure is well controlled.  I reviewed her external labs, her LDL is >100 and would prefer her to have LDL at least less  than 100 or even closer to 70 in view of small cerebral vascular disease.  Patient prefers to be on medical therapy for now, advised her to try flaxseed flakes.  Otherwise no changes in the medications were done today.  I will see her back in a year with pacemaker check.    Adrian Prows, MD, [facility]  02/17/2020, 12:26 PM Office: [phone] Pager: [phone]

## 2020-02-18 DIAGNOSIS — G894 Chronic pain syndrome: Secondary | ICD-10-CM | POA: Diagnosis not present

## 2020-02-23 DIAGNOSIS — G894 Chronic pain syndrome: Secondary | ICD-10-CM | POA: Diagnosis not present

## 2020-02-23 DIAGNOSIS — H9201 Otalgia, right ear: Secondary | ICD-10-CM | POA: Diagnosis not present

## 2020-02-25 DIAGNOSIS — H838X3 Other specified diseases of inner ear, bilateral: Secondary | ICD-10-CM | POA: Diagnosis not present

## 2020-02-25 DIAGNOSIS — J343 Hypertrophy of nasal turbinates: Secondary | ICD-10-CM | POA: Diagnosis not present

## 2020-02-25 DIAGNOSIS — H903 Sensorineural hearing loss, bilateral: Secondary | ICD-10-CM | POA: Diagnosis not present

## 2020-02-25 DIAGNOSIS — R42 Dizziness and giddiness: Secondary | ICD-10-CM | POA: Diagnosis not present

## 2020-02-25 DIAGNOSIS — H6983 Other specified disorders of Eustachian tube, bilateral: Secondary | ICD-10-CM | POA: Diagnosis not present

## 2020-02-25 DIAGNOSIS — J310 Chronic rhinitis: Secondary | ICD-10-CM | POA: Diagnosis not present

## 2020-03-01 DIAGNOSIS — G894 Chronic pain syndrome: Secondary | ICD-10-CM | POA: Diagnosis not present

## 2020-03-08 DIAGNOSIS — G894 Chronic pain syndrome: Secondary | ICD-10-CM | POA: Diagnosis not present

## 2020-03-15 DIAGNOSIS — G894 Chronic pain syndrome: Secondary | ICD-10-CM | POA: Diagnosis not present

## 2020-03-22 DIAGNOSIS — M50321 Other cervical disc degeneration at C4-C5 level: Secondary | ICD-10-CM | POA: Diagnosis not present

## 2020-03-22 DIAGNOSIS — G894 Chronic pain syndrome: Secondary | ICD-10-CM | POA: Diagnosis not present

## 2020-03-28 DIAGNOSIS — M50321 Other cervical disc degeneration at C4-C5 level: Secondary | ICD-10-CM | POA: Diagnosis not present

## 2020-03-28 DIAGNOSIS — R269 Unspecified abnormalities of gait and mobility: Secondary | ICD-10-CM | POA: Diagnosis not present

## 2020-03-28 DIAGNOSIS — G894 Chronic pain syndrome: Secondary | ICD-10-CM | POA: Diagnosis not present

## 2020-03-28 DIAGNOSIS — G4489 Other headache syndrome: Secondary | ICD-10-CM | POA: Diagnosis not present

## 2020-04-07 DIAGNOSIS — M79644 Pain in right finger(s): Secondary | ICD-10-CM | POA: Diagnosis not present

## 2020-05-02 DIAGNOSIS — I10 Essential (primary) hypertension: Secondary | ICD-10-CM | POA: Diagnosis not present

## 2020-05-02 DIAGNOSIS — R42 Dizziness and giddiness: Secondary | ICD-10-CM | POA: Diagnosis not present

## 2020-05-08 DIAGNOSIS — M25571 Pain in right ankle and joints of right foot: Secondary | ICD-10-CM | POA: Diagnosis not present

## 2020-05-22 DIAGNOSIS — Z950 Presence of cardiac pacemaker: Secondary | ICD-10-CM | POA: Diagnosis not present

## 2020-05-22 DIAGNOSIS — I442 Atrioventricular block, complete: Secondary | ICD-10-CM | POA: Diagnosis not present

## 2020-05-22 DIAGNOSIS — Z45018 Encounter for adjustment and management of other part of cardiac pacemaker: Secondary | ICD-10-CM | POA: Diagnosis not present

## 2020-05-30 ENCOUNTER — Telehealth: Payer: Self-pay | Admitting: Cardiology

## 2020-06-06 NOTE — Telephone Encounter (Signed)
Called and spoke with patient regarding her remote pacemaker check results.

## 2020-06-28 DIAGNOSIS — N3281 Overactive bladder: Secondary | ICD-10-CM | POA: Diagnosis not present

## 2020-07-04 DIAGNOSIS — H2511 Age-related nuclear cataract, right eye: Secondary | ICD-10-CM | POA: Diagnosis not present

## 2020-07-04 DIAGNOSIS — H401132 Primary open-angle glaucoma, bilateral, moderate stage: Secondary | ICD-10-CM | POA: Diagnosis not present

## 2020-07-04 DIAGNOSIS — Z961 Presence of intraocular lens: Secondary | ICD-10-CM | POA: Diagnosis not present

## 2020-07-04 DIAGNOSIS — H04123 Dry eye syndrome of bilateral lacrimal glands: Secondary | ICD-10-CM | POA: Diagnosis not present

## 2020-07-13 ENCOUNTER — Ambulatory Visit (INDEPENDENT_AMBULATORY_CARE_PROVIDER_SITE_OTHER): Payer: Medicare Other | Admitting: Neurology

## 2020-07-13 ENCOUNTER — Encounter: Payer: Self-pay | Admitting: Neurology

## 2020-07-13 ENCOUNTER — Telehealth: Payer: Self-pay | Admitting: Neurology

## 2020-07-13 VITALS — BP 149/76 | HR 69 | Ht 64.0 in | Wt 121.0 lb

## 2020-07-13 DIAGNOSIS — M542 Cervicalgia: Secondary | ICD-10-CM

## 2020-07-13 DIAGNOSIS — E538 Deficiency of other specified B group vitamins: Secondary | ICD-10-CM | POA: Diagnosis not present

## 2020-07-13 DIAGNOSIS — R269 Unspecified abnormalities of gait and mobility: Secondary | ICD-10-CM | POA: Diagnosis not present

## 2020-07-13 DIAGNOSIS — M4807 Spinal stenosis, lumbosacral region: Secondary | ICD-10-CM | POA: Diagnosis not present

## 2020-07-13 NOTE — Telephone Encounter (Signed)
medicare/[employer] supp order sent to GI. No auth they will reach out to the patient to schedule.
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
## 2020-07-18 ENCOUNTER — Telehealth: Payer: Self-pay | Admitting: *Deleted

## 2020-07-18 LAB — RPR: RPR Ser Ql: NONREACTIVE

## 2020-07-18 LAB — COPPER, SERUM: Copper: 101 ug/dL (ref 80–158)

## 2020-07-18 LAB — VITAMIN B12: Vitamin B-12: 701 pg/mL (ref 232–1245)

## 2020-07-18 NOTE — Telephone Encounter (Signed)
LVM advising her labs are normal. Left # for questions.

## 2020-07-20 ENCOUNTER — Ambulatory Visit
Admission: RE | Admit: 2020-07-20 | Discharge: 2020-07-20 | Disposition: A | Discharge status: Home / Self Care | Payer: Medicare Other | Source: Ambulatory Visit | Attending: Neurology | Admitting: Neurology

## 2020-07-20 DIAGNOSIS — M47812 Spondylosis without myelopathy or radiculopathy, cervical region: Secondary | ICD-10-CM | POA: Diagnosis not present

## 2020-07-20 DIAGNOSIS — M542 Cervicalgia: Secondary | ICD-10-CM

## 2020-07-20 DIAGNOSIS — M4807 Spinal stenosis, lumbosacral region: Secondary | ICD-10-CM | POA: Diagnosis not present

## 2020-07-20 DIAGNOSIS — R269 Unspecified abnormalities of gait and mobility: Secondary | ICD-10-CM | POA: Diagnosis not present

## 2020-07-20 DIAGNOSIS — M48061 Spinal stenosis, lumbar region without neurogenic claudication: Secondary | ICD-10-CM | POA: Diagnosis not present

## 2020-07-20 DIAGNOSIS — M47816 Spondylosis without myelopathy or radiculopathy, lumbar region: Secondary | ICD-10-CM | POA: Diagnosis not present

## 2020-07-20 DIAGNOSIS — G8929 Other chronic pain: Secondary | ICD-10-CM | POA: Diagnosis not present

## 2020-07-20 DIAGNOSIS — M4802 Spinal stenosis, cervical region: Secondary | ICD-10-CM | POA: Diagnosis not present

## 2020-07-21 ENCOUNTER — Telehealth: Payer: Self-pay | Admitting: Neurology

## 2020-07-21 DIAGNOSIS — N281 Cyst of kidney, acquired: Secondary | ICD-10-CM

## 2020-07-21 NOTE — Telephone Encounter (Signed)
I called the patient.  The CT of the lumbar spine showed severe spinal stenosis at the L4-5 level with moderate to severe spinal stenosis at the levels above and below.  The patient also has renal cyst on the right, she was unaware of this, I will go ahead and get a renal ultrasound as recommended by the radiologist.  I discussed the possible surgical referral for consultation, she will think about it and let me know if she wants to do this in the future.   CT lumbar 07/20/20:  IMPRESSION: 1. Progressive discogenic and facet degenerative changes throughout the lumbar spine as described above, maximal features are most pronounced at L4-L5 with severe canal stenosis, severe right foraminal narrowing, moderate left foraminal narrowing and effacement of lateral recesses including contact of the traversing and exiting nerve roots. 2. Moderate to severe canal stenosis at L3-L4 and L5-S1 as well including foraminal narrowing and at least partial effacement of the lateral recesses. 3. Absence of the left twelfth rib. 4. Focal region of mid mesenteric hazy stranding with numerous reactive appearing clustered mid mesenteric lymph nodes compatible with mesenteritis. 5. Complex right renal cyst with questionable mural thickening and internal calcified septation. Consider further characterization with outpatient ultrasound or renal MR. 6. Aortic Atherosclerosis (ICD10-I70.0).    CT cervical 07/20/20:  IMPRESSION: Multilevel degenerative change in the cervical spine most prominent at C5-6 and C6-7  Moderate right foraminal encroachment at C5-6 due to prominent spurring. Mild spinal stenosis  Mild to moderate foraminal encroachment bilaterally at C6-7 with mild spinal stenosis  Degenerative anterolisthesis C7-T1 without stenosis.

## 2020-07-25 DIAGNOSIS — Z1231 Encounter for screening mammogram for malignant neoplasm of breast: Secondary | ICD-10-CM | POA: Diagnosis not present

## 2020-08-04 DIAGNOSIS — F329 Major depressive disorder, single episode, unspecified: Secondary | ICD-10-CM | POA: Diagnosis not present

## 2020-08-04 DIAGNOSIS — I10 Essential (primary) hypertension: Secondary | ICD-10-CM | POA: Diagnosis not present

## 2020-08-04 DIAGNOSIS — I6381 Other cerebral infarction due to occlusion or stenosis of small artery: Secondary | ICD-10-CM | POA: Diagnosis not present

## 2020-08-04 DIAGNOSIS — R002 Palpitations: Secondary | ICD-10-CM | POA: Diagnosis not present

## 2020-08-04 DIAGNOSIS — R0789 Other chest pain: Secondary | ICD-10-CM | POA: Diagnosis not present

## 2020-08-04 DIAGNOSIS — F419 Anxiety disorder, unspecified: Secondary | ICD-10-CM | POA: Diagnosis not present

## 2020-08-04 DIAGNOSIS — E1169 Type 2 diabetes mellitus with other specified complication: Secondary | ICD-10-CM | POA: Diagnosis not present

## 2020-08-08 ENCOUNTER — Ambulatory Visit
Admission: RE | Admit: 2020-08-08 | Discharge: 2020-08-08 | Disposition: A | Discharge status: Home / Self Care | Payer: Medicare Other | Source: Ambulatory Visit | Attending: Neurology | Admitting: Neurology

## 2020-08-08 DIAGNOSIS — I10 Essential (primary) hypertension: Secondary | ICD-10-CM | POA: Diagnosis not present

## 2020-08-08 DIAGNOSIS — N281 Cyst of kidney, acquired: Secondary | ICD-10-CM

## 2020-08-08 DIAGNOSIS — R002 Palpitations: Secondary | ICD-10-CM | POA: Diagnosis not present

## 2020-08-09 ENCOUNTER — Telehealth: Payer: Self-pay | Admitting: Neurology

## 2020-08-09 NOTE — Progress Notes (Signed)
Subjective:  Primary Physician:  Crist Infante, MD  Patient ID: Charlene Barker, female    DOB: 11-Jun-1945, 75 y.o.   MRN: [id number]  Chief Complaint  Patient presents with  . Palpitations  . Follow-up    Abnormal EKG  . Left bundle branch block    HPI: Charlene Barker  is a 75 y.o. female  with sinus node dysfunction and also high degree AV block and recurrent syncope leading to permanent pacemaker implantation on 04/15/2012.  Patient presents for urgent visit as recommended by her PCP.  She was seen in her PCPs office 08/04/2020 for complaint of heart racing symptoms.  EKG done that day revealed T wave inversions V5 and V6, therefore PCP recommended urgent evaluation in our office.  Upon further review of PCP EKG ST-T wave changes appear consistent with changes secondary to known left bundle branch block.  Patient reports she has trouble remembering the details of how she was feeling last week, but states she was seen by her PCP on 08/04/2020 because for 2 to 3 days prior to that she was having intermittent episodes of heart racing symptoms and generally not feeling well.  She states she also had some intermittent chest pressure for those 3 days, but has had no recurrence since then.  She is unsure of what she was doing when symptoms started, and does not recall how long symptoms lasted.  She denies dyspnea, syncope, near syncope, dizziness, radiation of chest pain.  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  Interrogation of patient's pacemaker on 08/04/2020 revealed normal pacemaker functioning.  Past Medical History:  Diagnosis Date  . Anxiety   . Atrioventricular block, complete -intermittent       . Complete heart block ([place]) 04/14/2012  . Diabetes mellitus without complication ([place])    per patient.   . Drug-induced hyperkalemia -associated with Aldactone   . Encounter for care of  pacemaker 10/27/2018  . GERD (gastroesophageal reflux disease)   . Glaucoma    bilateral  . HTN (hypertension)   . Left bundle branch block   . Lightheadedness    Associated with exercise  . 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 Pacemaker    St Jude  . Paresthesia of both legs 03/02/2015  . Rheumatoid arthritis ([place])   . Sinus node dysfunction ([place]) 02/17/2019  . Spinal stenosis of lumbar region 03/02/2015   L4-5  . Syncope     Past Surgical History:  Procedure Laterality Date  . BLADDER SURGERY     sling  . EYE SURGERY     several, bilateral  . LOOP RECORDER IMPLANT N/A 09/20/2011   Procedure: LOOP RECORDER IMPLANT;  Surgeon: Deboraha Sprang, MD;  Location: [facility] CATH LAB;  Service: Cardiovascular;  Laterality: N/A;  . PERMANENT PACEMAKER INSERTION N/A 04/15/2012   Family History  Problem Relation Age of Onset  . Leukemia Mother   . Stroke Father   . Heart failure Father   . Heart attack Father   . Heart disease Father   . Diabetes Sister   . Asthma Brother   . Restless legs syndrome Brother     Social History   Tobacco Use  . Smoking status: Never Smoker  . Smokeless tobacco: Never Used  Substance Use Topics  . Alcohol use: Not Currently    Comment: wine only at christmas time  Marital Status: Married    Medications  and Allergies:   Allergies  Allergen Reactions  . Codeine Other (See Comments)    Makes the patient feel like she has "cotton mouth" and "weird"; ineffective, also  . Contrast Media [Iodinated Diagnostic Agents] Other (See Comments)    Numbness, burning  . Penicillins Other (See Comments)    "Childhood allergy" Has patient had a PCN reaction causing immediate rash, facial/tongue/throat swelling, SOB or lightheadedness with hypotension: Unk Has patient had a PCN reaction causing severe rash involving mucus membranes or skin necrosis: Unk Has patient had a PCN reaction that required hospitalization: Unk Has patient had a PCN reaction occurring  within the last 10 years: No If all of the above answers are "NO", then may proceed with Cephalosporin use.   . Sulfa Drugs Cross Reactors Other (See Comments)    Reaction unknown (allergy is from childhood)  . Chlorhexidine Rash  . Latex Rash    No purple gloves  . Povidone Rash    Current Outpatient Medications on File Prior to Visit  Medication Sig Dispense Refill  . acetaminophen (TYLENOL) 650 MG CR tablet Take 650 mg by mouth daily as needed (for headaches, discomfort or back pain).    Marland Kitchen amLODipine (NORVASC) 2.5 MG tablet Every morning    . aspirin EC 81 MG tablet Take 81 mg by mouth every other day.    . AZO-CRANBERRY PO Take by mouth daily.    . Cholecalciferol (VITAMIN D3 HIGH POTENCY PO) Take 1,000 mcg by mouth daily.     Marland Kitchen CINNAMON PO Take by mouth.    . DORZOLAMIDE HCL OP Apply to eye daily.    . Flaxseed Oil OIL by Does not apply route daily.    Marland Kitchen latanoprost (XALATAN) 0.005 % ophthalmic solution 1 drop at bedtime.    Marland Kitchen LORazepam (ATIVAN) 0.5 MG tablet as needed.    . meclizine (ANTIVERT) 25 MG tablet Take 25 mg by mouth 3 (three) times daily as needed. For dizziness/vertigo    . Nutritional Supplements (JUICE PLUS FIBRE PO) Take by mouth 2 (two) times daily.    . timolol (BETIMOL) 0.25 % ophthalmic solution 1-2 drops 2 (two) times daily.     No current facility-administered medications on file prior to visit.   ROS:   Review of Systems  Constitutional: Negative for malaise/fatigue and weight gain.  Cardiovascular: Positive for chest pain and palpitations. Negative for claudication, dyspnea on exertion, leg swelling, near-syncope, orthopnea, paroxysmal nocturnal dyspnea and syncope.  Respiratory: Negative for shortness of breath.   Hematologic/Lymphatic: Does not bruise/bleed easily.  Gastrointestinal: Negative for melena.  Neurological: Positive for disturbances in coordination. Negative for dizziness and weakness.  Psychiatric/Behavioral: Positive for depression  and hypervigilance. The patient is nervous/anxious.     Objective:  Blood pressure 140/70, pulse 62, temperature 98 F (36.7 C), resp. rate 16, height 5\' 4"  (1.626 m), weight 122 lb (55.3 kg), SpO2 98 %. Body mass index is 20.94 kg/m.  Vitals with BMI 08/10/2020 07/13/2020 02/17/2020  Height 5\' 4"  5\' 4"  5\' 4"   Weight 122 lbs 121 lbs 118 lbs  BMI 20.93 97.02 63.78  Systolic 588 502 774  Diastolic 70 76 70  Pulse 62 69 64    Physical Exam Vitals reviewed.  Constitutional:      General: She is not in acute distress.    Appearance: She is well-developed.  HENT:     Head: Normocephalic and atraumatic.  Neck:     Thyroid: No thyromegaly.     Vascular: No carotid  bruit or JVD.  Cardiovascular:     Rate and Rhythm: Normal rate and regular rhythm.     Pulses: Intact distal pulses.     Heart sounds: Normal heart sounds, S1 normal and S2 normal. No murmur heard. No gallop.   Pulmonary:     Effort: Pulmonary effort is normal. No respiratory distress.     Breath sounds: Normal breath sounds. No wheezing, rhonchi or rales.  Abdominal:     General: Bowel sounds are normal. There is no distension.     Palpations: Abdomen is soft.  Musculoskeletal:        General: Normal range of motion.     Cervical back: Neck supple.     Right lower leg: No edema.     Left lower leg: No edema.  Skin:    General: Skin is warm and dry.  Neurological:     General: No focal deficit present.     Mental Status: She is alert and oriented to person, place, and time.    Radiology:   CT Head 07/16/2016: No change from 01/06/2015 (small vessel disease).  1. No acute intracranial findings. 2. 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  Labs:   CMP Latest Ref Rng & Units 07/16/2016 07/16/2016 03/02/2015  Glucose 65 - 99 mg/dL 160(H) 162(H) -  BUN 6 - 20 mg/dL 7 5(L) -  Creatinine 0.44 - 1.00 mg/dL 0.60 0.67 -  Sodium 135 - 145 mmol/L 140 139 -  Potassium 3.5 - 5.1 mmol/L 4.1 4.1 -   Chloride 101 - 111 mmol/L 104 104 -  CO2 22 - 32 mmol/L - 25 -  Calcium 8.9 - 10.3 mg/dL - 9.6 -  Total Protein 6.5 - 8.1 g/dL - 6.7 6.5  Total Bilirubin 0.3 - 1.2 mg/dL - 0.7 -  Alkaline Phos 38 - 126 U/L - 84 -  AST 15 - 41 U/L - 73(H) -  ALT 14 - 54 U/L - 75(H) -   CBC Latest Ref Rng & Units 07/16/2016 07/16/2016 04/16/2012  WBC 4.0 - 10.5 K/uL - 9.0 10.2  Hemoglobin 12.0 - 15.0 g/dL 13.3 12.6 13.8  Hematocrit 36.0 - 46.0 % 39.0 37.1 39.8  Platelets 150 - 400 K/uL - 282 213   Lipid Panel     Component Value Date/Time   CHOL 218 (H) 12/27/2014 1320   TRIG 125 12/27/2014 1320   HDL 59 12/27/2014 1320   CHOLHDL 3.7 12/27/2014 1320   LDLCALC 134 (H) 12/27/2014 1320   HEMOGLOBIN A1C Lab Results  Component Value Date   HGBA1C 6.6 (H) 12/27/2014   TSH No results for input(s): TSH in the last 8760 hours.  External Labs:  Cholesterol, total 182.000 m 09/15/2019 HDL 57 MG/DL 09/15/2019 LDL 106.000 m 09/15/2019 Triglycerides 93.000 09/15/2019  A1C 6.300 % 09/15/2019 TSH 1.550 09/15/2019  Hemoglobin 14.200 g/d 09/15/2019  Creatinine, Serum 0.800 mg/ 09/15/2019 Potassium 4.100 mm 07/16/2016 Magnesium N/D ALT (SGPT) 47.000 uni 09/15/2019   Cardiac Studies     Echocardiogram 05/27/2018: Left ventricle cavity is normal in size. Mild concentric hypertrophy of the left ventricle. Abnormal septal wall motion due to left bundle branch block or paced rhythm. Calculated EF 50%. Mild (Grade I) mitral regurgitation. Moderate tricuspid regurgitation. Estimated pulmonary artery systolic pressure 22  mmHg. Compared to previous study in 2013, pacemaker, mitral and tricuspid regurgitation are new findings.  Lexiscan stress 02/07/12: Normal perfusion. No ischemia. Normal LVEF.  Device check: Pacemaker--St Jude Accent DR 2110 dual chamber pacemaker  04/15/2012  Scheduled Remote pacemaker check 05/30/2020:  RA lead noise and false AHR episodes, brief. No VHR episodes. Normal pacemaker  function. Battery longevity is 6.2 years. RA pacing is <1.0 %, RV pacing is <1.0 %. No change from prior evaluation.  Scheduled  In office pacemaker check 02/17/20  Single (S)/Dual (D)/BV: D. Presenting ASVS. Sinus @ 60/min Pacemaker dependant:  No. Underlying NSR. AP 1.4%, VP <1%.. AMS Episodes 3.  EGM suggest noise. HVR 0. Longevity 8 Years. Magnet rate: >85%. Lead measurements: Stable. Histogram: Low (L)/normal (N)/high (H)  Normal. Patient activity Active.   Observations: Normal pacemaker function. Changes: None.   EKG:   08/11/2020: Atrial paced rhythm at a rate of 67 bpm.  Left bundle branch block with secondary ST-T wave changes.  No further analysis.  PCP EKG 08/04/2020: Sinus rhythm at a rate of 63 bpm.  Left bundle branch block with secondary ST-T changes.  No further analysis.  EKG 08/25/2017: Normal sinus rhythm at rate of 62 bpm, left bundle branch block.no further analysis  Assessment:   1. Left bundle branch block   2. Palpitations   3. Precordial pain    No orders of the defined types were placed in this encounter.  Medications Discontinued During This Encounter  Medication Reason  . Pumpkin Seed-Soy Germ (AZO BLADDER CONTROL/GO-LESS PO) Error  . guaiFENesin (MUCINEX) 600 MG 12 hr tablet Error      Recommendations:   HPI: Charlene Barker  is a 75 y.o. female  with sinus node dysfunction and also high degree AV block and recurrent syncope leading to permanent pacemaker implantation on 04/15/2012. Her past medical history is significant for hypertension, benign positional vertigo, left bundle branch block, prediabetes mellitus.  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   Patient presents for urgent visit as recommended by her PCP.  She was seen in her PCPs office 08/04/2020 for complaint of heart racing symptoms.  EKG done that day revealed T wave inversions V5 and V6, therefore PCP recommended urgent evaluation in  our office.  Pacemaker is functioning normally per interrogation on 08/04/20.  Further review of EKG from PCPs office as well as EKG today shows ST-T wave changes consistent with changes secondary to known left bundle branch block.  Patient denies active chest pain, she has had no recurrence of "chest pressure" since visit to PCPs office on 08/04/2020.  However in view of symptoms and cardiovascular risk factors including hypertension, advanced age, prediabetes, recommend patient proceed with further ischemic evaluation.  Discussed at length with patient regarding options of undergoing nuclear stress testing versus coronary CTA.  Patient had significant concerns as she states she her previous stress tests were painful and people administering the stress test were unkind and made her uncomfortable.  Discussed with patient that in view of her pacemaker option for coronary CTA is likely limited, however I would be willing to discuss with radiology if patient would like to pursue that option.  However patient states she is also not willing to do CTA at this time due to concerns that she may not react well to contrast used.  I have reassured patient that stress test would be done by staff in our office who would treat her well, however patient remains adamant that she does not wish to proceed with further ischemic testing.  I therefore discussed at length with patient the risk of not proceeding with further testing including myocardial infarction, patient verbalized  understanding of this risk and despite risk patient is not agreeable to proceed with further ischemic evaluation at this time.  As patient is not willing to proceed with ischemic evaluation, I have given her strict precautions and counseled her regarding signs and symptoms that would warrant emergent evaluation, she verbalized understanding and agreement.  I also recommended patient undergo echocardiogram.  Patient was hesitant to proceed with echo, however  after discussing benefits at length patient is willing to have echocardiogram done at our office.  Patient's blood pressure is above goal in the office today, however she is very hesitant to make medication changes at this time and would prefer to discuss that with her PCP.  I will therefore defer management of hypertension to primary care.  Follow-up in 6 weeks for precordial pain and results of cardiac testing.  Patient was seen in collaboration with Dr. Terri Skains who reviewed patient's EKG with me.   This was a 50-minute encounter with face-to-face counseling, medical records review, coordination of care, explanation of complex medical issues, complex medical decision making.    Alethia Berthold, PA-C 08/11/2020, 10:39 AM Office: [phone]

## 2020-08-09 NOTE — Telephone Encounter (Signed)
I called the patient.  The renal cyst documented was apparently known from prior evaluation in 2009, no change in cyst characteristics over that period of time.   Renal US 08/09/20:  IMPRESSION: 1. Mildly complicated right renal cyst measuring 3.8 x 3.1 x 4 cm similar in appearance to the prior examination of 09/01/2007 2. No obstructive uropathy.

## 2020-08-10 ENCOUNTER — Encounter: Payer: Self-pay | Admitting: Student

## 2020-08-10 ENCOUNTER — Other Ambulatory Visit: Payer: Self-pay

## 2020-08-10 ENCOUNTER — Ambulatory Visit: Payer: Medicare Other | Admitting: Student

## 2020-08-10 VITALS — BP 140/70 | HR 62 | Temp 98.0°F | Resp 16 | Ht 64.0 in | Wt 122.0 lb

## 2020-08-10 DIAGNOSIS — I447 Left bundle-branch block, unspecified: Secondary | ICD-10-CM | POA: Diagnosis not present

## 2020-08-10 DIAGNOSIS — R072 Precordial pain: Secondary | ICD-10-CM

## 2020-08-10 DIAGNOSIS — R002 Palpitations: Secondary | ICD-10-CM

## 2020-08-21 DIAGNOSIS — Z950 Presence of cardiac pacemaker: Secondary | ICD-10-CM | POA: Diagnosis not present

## 2020-08-21 DIAGNOSIS — Z45018 Encounter for adjustment and management of other part of cardiac pacemaker: Secondary | ICD-10-CM | POA: Diagnosis not present

## 2020-08-21 DIAGNOSIS — Z45010 Encounter for checking and testing of cardiac pacemaker pulse generator [battery]: Secondary | ICD-10-CM | POA: Diagnosis not present

## 2020-08-21 DIAGNOSIS — I442 Atrioventricular block, complete: Secondary | ICD-10-CM | POA: Diagnosis not present

## 2020-08-22 ENCOUNTER — Other Ambulatory Visit: Payer: Medicare Other

## 2020-08-23 ENCOUNTER — Other Ambulatory Visit: Payer: Self-pay

## 2020-08-23 ENCOUNTER — Ambulatory Visit: Payer: Medicare Other

## 2020-08-23 DIAGNOSIS — I447 Left bundle-branch block, unspecified: Secondary | ICD-10-CM | POA: Diagnosis not present

## 2020-08-23 DIAGNOSIS — R072 Precordial pain: Secondary | ICD-10-CM

## 2020-08-28 NOTE — Progress Notes (Signed)
Please inform patient no significant change compared to prior echo.

## 2020-08-28 NOTE — Progress Notes (Signed)
Patient had Echocardiogram 05/27/2018. If she wanted to know when that is it.

## 2020-08-28 NOTE — Progress Notes (Signed)
Called pt to inform her about her echo results. Pt mention she has never had a echo before. Pt understood.

## 2020-08-30 ENCOUNTER — Encounter: Payer: Self-pay | Admitting: Obstetrics and Gynecology

## 2020-08-30 ENCOUNTER — Ambulatory Visit (INDEPENDENT_AMBULATORY_CARE_PROVIDER_SITE_OTHER): Payer: Medicare Other | Admitting: Obstetrics and Gynecology

## 2020-08-30 ENCOUNTER — Other Ambulatory Visit: Payer: Self-pay

## 2020-08-30 VITALS — BP 127/74 | HR 67 | Ht 64.0 in | Wt 122.0 lb

## 2020-08-30 DIAGNOSIS — N3281 Overactive bladder: Secondary | ICD-10-CM | POA: Diagnosis not present

## 2020-08-30 DIAGNOSIS — R350 Frequency of micturition: Secondary | ICD-10-CM | POA: Diagnosis not present

## 2020-08-30 LAB — POCT URINALYSIS DIPSTICK
Appearance: NORMAL
Bilirubin, UA: NEGATIVE
Blood, UA: NEGATIVE
Glucose, UA: NEGATIVE
Ketones, UA: NEGATIVE
Leukocytes, UA: NEGATIVE
Nitrite, UA: NEGATIVE
Protein, UA: NEGATIVE
Spec Grav, UA: 1.01 (ref 1.010–1.025)
Urobilinogen, UA: 0.2 E.U./dL
pH, UA: 6.5 (ref 5.0–8.0)

## 2020-08-30 MED ORDER — MIRABEGRON ER 25 MG PO TB24: 25.0000 mg | ORAL_TABLET | Freq: Every day | ORAL | 5 refills | Status: DC

## 2020-08-30 NOTE — Patient Instructions (Signed)
We discussed the symptoms of overactive bladder (OAB), which include urinary urgency, urinary frequency, night-time urination, with or without urge incontinence.  We discussed management including behavioral therapy (decreasing bladder irritants by following a bladder diet, urge suppression strategies, timed voids, bladder retraining), physical therapy, medication; and for refractory cases posterior tibial nerve stimulation, sacral neuromodulation, and intravesical botulinum toxin injection.   For Beta-3 agonist medication, we discussed the potential side effect of elevated blood pressure which is more likely to occur in individuals with uncontrolled hypertension. You were given Myrbetriq 25 mg.  It can take a month to start working so give it time, but if you have bothersome side effects call sooner and we can try a different medication.  Call us if you have trouble filling the prescription or if it's not covered by your insurance.   We also sent a referral for physical therapy.

## 2020-08-30 NOTE — Progress Notes (Signed)
[facility] New Patient Evaluation and Consultation  Referring Provider: Bobbye Charleston, MD PCP: Crist Infante, MD Date of Service: 08/30/2020  SUBJECTIVE Chief Complaint: New Patient (Initial Visit) (Cant hold urine once she gets to bathroom)  History of Present Illness: Charlene Barker is a 75 y.o. White or Caucasian female seen in consultation at the request of Dr. Philis Pique for evaluation of incontinence.    Review of records significant for: Has a history of TVT sling and OAB. Has been on vesicare, toviaz and oxybutynin. Has glaucoma and not able to take medications recently. Noted to have elevated pelvic floor muscle tone and poor strength. [place] for pelvic floor rehab.   Had cystoscopy in 2017 with [facility] which did not show any sling erosion.  Urinary Symptoms: Leaks urine with with movement to the bathroom- "just comes", does not have control of her bladder when she gets the urge. Happens every time on the way to the bathroom.  Pad use: several  pads and adult diapers together per day.  Used 4-5 overnight.  She is bothered by her UI symptoms. Takes Azo for bladder irritation, helps to control her ferquency  Day time voids- every few hours but does not have control.  Nocturia: 4-5  times per night to void. Voiding dysfunction: she empties her bladder well.  does not use a catheter to empty bladder.  When urinating, she feels she has no difficulties Drinks: 1 cup coffee (half caf), water (amount depends on if she is going out) per day  UTIs: 0 UTI's in the last year.   Denies history of blood in urine and kidney or bladder stones  Pelvic Organ Prolapse Symptoms:                  She Denies a feeling of a bulge the vaginal area.  Bowel Symptom: Bowel movements: 1 time(s) per day Stool consistency: soft  Straining: no.  Splinting: no.  Incomplete evacuation: no.  She Denies accidental bowel leakage / fecal incontinence  Sexual  Function Sexually active: no.   Pelvic Pain Denies pelvic pain    Past Medical History:  Past Medical History:  Diagnosis Date  . Anxiety   . Atrioventricular block, complete -intermittent       . Complete heart block ([place]) 04/14/2012  . Diabetes mellitus without complication ([place])    per patient.   . Drug-induced hyperkalemia -associated with Aldactone   . Encounter for care of pacemaker 10/27/2018  . GERD (gastroesophageal reflux disease)   . Glaucoma    bilateral  . HTN (hypertension)   . Left bundle branch block   . Lightheadedness    Associated with exercise  . 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 Pacemaker    St Jude  . Paresthesia of both legs 03/02/2015  . Rheumatoid arthritis ([place])   . Sinus node dysfunction ([place]) 02/17/2019  . Spinal stenosis of lumbar region 03/02/2015   L4-5  . Syncope      Past Surgical History:   Past Surgical History:  Procedure Laterality Date  . BLADDER SURGERY     sling  . EYE SURGERY     several, bilateral  . LOOP RECORDER IMPLANT N/A 09/20/2011   Procedure: LOOP RECORDER IMPLANT;  Surgeon: Deboraha Sprang, MD;  Location: [facility] CATH LAB;  Service: Cardiovascular;  Laterality: N/A;  . PERMANENT PACEMAKER INSERTION N/A 04/15/2012     Past OB/GYN History: OB History  Gravida Para Term Preterm AB Living  1 1   1   1   SAB IAB Ectopic Multiple Live Births               # Outcome Date GA Lbr Len/2nd Weight Sex Delivery Anes PTL Lv  1 Preterm 05/31/67 [redacted]w[redacted]d    Vag-Breech      Menopausal: Yes, Denies vaginal bleeding since menopause Last pap smear was 2 years ago. Any history of abnormal pap smears: no.   Medications: She has a current medication list which includes the following prescription(s): acetaminophen, amlodipine, aspirin ec, cranberry, cholecalciferol, cinnamon, dorzolamide hcl, flaxseed oil, latanoprost, lorazepam, meclizine, mirabegron er, nutritional supplements, probiotic product, and timolol.   Allergies:  Patient is allergic to codeine, contrast media [iodinated diagnostic agents], penicillins, sulfa drugs cross reactors, chlorhexidine, latex, and povidone.   Social History:  Social History   Tobacco Use  . Smoking status: Never Smoker  . Smokeless tobacco: Never Used  Vaping Use  . Vaping Use: Never used  Substance Use Topics  . Alcohol use: Not Currently    Comment: wine only at christmas time  . Drug use: No    Relationship status: married She is not employed- retired. Regular exercise: No   Family History:   Family History  Problem Relation Age of Onset  . Leukemia Mother   . Stroke Father   . Heart failure Father   . Heart attack Father   . Heart disease Father   . Diabetes Sister   . Asthma Brother   . Restless legs syndrome Brother      Review of Systems: Review of Systems  Constitutional: Negative for fever, malaise/fatigue and weight loss.  Respiratory: Negative for cough, shortness of breath and wheezing.   Cardiovascular: Negative for chest pain, palpitations and leg swelling.  Gastrointestinal: Negative for abdominal pain and blood in stool.  Genitourinary: Negative for dysuria.  Musculoskeletal: Positive for myalgias.  Skin: Negative for rash.  Neurological: Positive for dizziness. Negative for headaches.  Endo/Heme/Allergies: Bruises/bleeds easily.       + hot flashes  Psychiatric/Behavioral: Negative for depression. The patient is nervous/anxious.      OBJECTIVE Physical Exam: Vitals:   08/30/20 1211  BP: 127/74  Pulse: 67  Weight: 122 lb (55.3 kg)  Height: 5\' 4"  (1.626 m)    Physical Exam Constitutional:      General: She is not in acute distress. Pulmonary:     Effort: Pulmonary effort is normal.  Abdominal:     General: There is no distension.     Palpations: Abdomen is soft.     Tenderness: There is no abdominal tenderness. There is no rebound.  Musculoskeletal:        General: No swelling. Normal range of motion.  Skin:     General: Skin is warm and dry.     Findings: No rash.  Neurological:     Mental Status: She is alert and oriented to person, place, and time.  Psychiatric:        Mood and Affect: Mood normal.        Behavior: Behavior normal.      GU / Detailed Urogynecologic Evaluation:  Pelvic Exam: Normal external female genitalia; Bartholin's and Skene's glands normal in appearance; urethral meatus normal in appearance, no urethral masses or discharge.   CST: negative  Speculum exam reveals normal vaginal mucosa with atrophy. Cervix normal appearance. Uterus normal single, nontender. Adnexa no mass, fullness, tenderness.     Pelvic floor strength I/V  Pelvic floor musculature: Right  levator non-tender, Right obturator non-tender, Left levator non-tender, Left obturator non-tender  POP-Q:   POP-Q  -2.5                                            Aa   -2.5                                           Ba  -6                                              C   2                                            Gh  2.5                                            Pb  7.5                                            tvl   -2                                            Ap  -2                                            Bp  -7.5                                              D     Rectal Exam:  Normal external rectum  Post-Void Residual (PVR) by Bladder Scan: In order to evaluate bladder emptying, we discussed obtaining a postvoid residual and she agreed to this procedure.  Procedure: The ultrasound unit was placed on the patient's abdomen in the suprapubic region after the patient had voided. A PVR of 7 ml was obtained by bladder scan.  Laboratory Results: POC urine: negative  I visualized the urine specimen, noting the specimen to be clear yellow  ASSESSMENT AND PLAN Ms. Chiles is a 75 y.o. with:  1. Overactive bladder   2. Urinary frequency     1. OAB We discussed the symptoms of  overactive bladder (OAB), which include urinary urgency, urinary frequency, nocturia, with or without urge incontinence.  While we do not know the exact etiology of OAB, several treatment options exist. We discussed management including behavioral therapy (decreasing bladder irritants, urge suppression strategies, timed voids, bladder retraining), physical therapy,  medication; for refractory cases posterior tibial nerve stimulation, sacral neuromodulation, and intravesical botulinum toxin injection.  For anticholinergic medications, we discussed the potential side effects of anticholinergics including dry eyes, dry mouth, constipation, cognitive impairment and urinary retention. She is not a candidate for these medications due to her glaucoma. For Beta-3 agonist medication, we discussed the potential side effect of elevated blood pressure which is more likely to occur in individuals with uncontrolled hypertension. - Prescribed Myrbetriq 25mg  daily. PA started for this medication. Was potentially interested in PTNS, but has a cardiac pacemaker.   Return 2-3 months to review symptoms   Jaquita Folds, MD   Medical Decision Making:  - Reviewed/ ordered a clinical laboratory test - Review and summation of prior records

## 2020-09-12 DIAGNOSIS — H401132 Primary open-angle glaucoma, bilateral, moderate stage: Secondary | ICD-10-CM | POA: Diagnosis not present

## 2020-09-12 DIAGNOSIS — H04123 Dry eye syndrome of bilateral lacrimal glands: Secondary | ICD-10-CM | POA: Diagnosis not present

## 2020-09-12 DIAGNOSIS — H2511 Age-related nuclear cataract, right eye: Secondary | ICD-10-CM | POA: Diagnosis not present

## 2020-09-18 DIAGNOSIS — Z1152 Encounter for screening for COVID-19: Secondary | ICD-10-CM | POA: Diagnosis not present

## 2020-09-18 DIAGNOSIS — E1169 Type 2 diabetes mellitus with other specified complication: Secondary | ICD-10-CM | POA: Diagnosis not present

## 2020-09-18 DIAGNOSIS — J309 Allergic rhinitis, unspecified: Secondary | ICD-10-CM | POA: Diagnosis not present

## 2020-09-18 DIAGNOSIS — U071 COVID-19: Secondary | ICD-10-CM | POA: Diagnosis not present

## 2020-09-18 DIAGNOSIS — R059 Cough, unspecified: Secondary | ICD-10-CM | POA: Diagnosis not present

## 2020-09-29 DIAGNOSIS — R739 Hyperglycemia, unspecified: Secondary | ICD-10-CM | POA: Diagnosis not present

## 2020-09-29 DIAGNOSIS — E785 Hyperlipidemia, unspecified: Secondary | ICD-10-CM | POA: Diagnosis not present

## 2020-09-29 DIAGNOSIS — E559 Vitamin D deficiency, unspecified: Secondary | ICD-10-CM | POA: Diagnosis not present

## 2020-10-05 ENCOUNTER — Ambulatory Visit: Payer: Medicare Other | Admitting: Student

## 2020-10-09 ENCOUNTER — Ambulatory Visit: Payer: Medicare Other | Admitting: Student

## 2020-10-09 ENCOUNTER — Ambulatory Visit: Payer: Medicare Other | Admitting: Cardiology

## 2020-10-10 NOTE — Progress Notes (Signed)
Subjective:  Primary Physician:  Crist Infante, MD  Patient ID: Cathie Beams, female    DOB: [birth year], 75 y.o.   MRN: [id number]  Chief Complaint  Patient presents with   Chest Pain   Follow-up    HPI: ILANNA DEIHL  is a 75 y.o. female  with sinus node dysfunction and also high degree AV block and recurrent syncope leading to permanent pacemaker implantation on 04/15/2012.  Patient with history of prediabetes.  She was seen in her PCPs office 08/04/2020 for complaint of heart racing symptoms.  EKG done that day revealed T wave inversions V5 and V6, therefore PCP recommended urgent evaluation in our office.  Upon further review of PCP EKG ST-T wave changes appear consistent with changes secondary to known left bundle branch block.  Patient presents for 6-week follow-up of precordial pain and results of cardiac testing.  Patient reports since last visit she feels her chest discomfort is worsened, particularly following echocardiogram.  Patient expressed great frustration and that she was asked to lay on her left side with her left arm up for an extended period of time in order to complete the echocardiogram.  She states this position exacerbated chest discomfort and since then she has been experiencing episodes of sharp left-sided chest pain, which are worse when she lays on her left side.  Notably since last visit patient had COVID-19 infection approximately 3 weeks ago and she has quit exercising on a regular basis. She denies dyspnea, syncope, near syncope, dizziness, radiation of chest pain. Of note patient did break her left humerus last year and expresses chest pain sometimes seems associated with left arm pain as well.   Patient continues to express that she does not wish to be on medications and refuses to undergo nuclear stress testing in our office, in fact she states there is only one office she is willing to undergo stress testing and but she has been unable to find the name of  the place where her previous stress test was done. Patient expressly wishes for her PCP to manage her hypertension and further medication recommendations as well.  Past Medical History:  Diagnosis Date   Anxiety    Atrioventricular block, complete -intermittent        Complete heart block ([place]) 04/14/2012   Diabetes mellitus without complication ([place])    per patient.    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   Pacemaker    St Jude   Paresthesia of both legs 03/02/2015   Rheumatoid arthritis ([place])    Sinus node dysfunction ([place]) 02/17/2019   Spinal stenosis of lumbar region 03/02/2015   L4-5   Syncope     Past Surgical History:  Procedure Laterality Date   BLADDER SURGERY     sling   EYE SURGERY     several, bilateral   LOOP RECORDER IMPLANT N/A 09/20/2011   Procedure: LOOP RECORDER IMPLANT;  Surgeon: Deboraha Sprang, MD;  Location: [facility] CATH LAB;  Service: Cardiovascular;  Laterality: N/A;   PERMANENT PACEMAKER INSERTION N/A 04/15/2012   Family History  Problem Relation Age of Onset   Leukemia Mother    Stroke Father    Heart failure Father  Heart attack Father    Heart disease Father    Diabetes Sister    Asthma Brother    Restless legs syndrome Brother     Social History   Tobacco Use   Smoking status: Never   Smokeless tobacco: Never  Substance Use Topics   Alcohol use: Not Currently    Comment: wine only at [employer] time  Marital Status: Married    Allergies   Allergies  Allergen Reactions   Codeine Other (See Comments)    Makes the patient feel like she has "cotton mouth" and "weird"; ineffective, also   Contrast Media [Iodinated Diagnostic Agents] Other (See Comments)    Numbness,  burning   Penicillins Other (See Comments)    "Childhood allergy" Has patient had a PCN reaction causing immediate rash, facial/tongue/throat swelling, SOB or lightheadedness with hypotension: Unk Has patient had a PCN reaction causing severe rash involving mucus membranes or skin necrosis: Unk Has patient had a PCN reaction that required hospitalization: Unk Has patient had a PCN reaction occurring within the last 10 years: No If all of the above answers are "NO", then may proceed with Cephalosporin use.    Sulfa Drugs Cross Reactors Other (See Comments)    Reaction unknown (allergy is from childhood)   Chlorhexidine Rash   Latex Rash    No purple gloves   Povidone Rash      Medications Prior to Visit:   Outpatient Medications Prior to Visit  Medication Sig Dispense Refill   acetaminophen (TYLENOL) 650 MG CR tablet Take 650 mg by mouth daily as needed (for headaches, discomfort or back pain).     aspirin EC 81 MG tablet Take 81 mg by mouth every other day.     AZO-CRANBERRY PO Take by mouth daily.     DORZOLAMIDE HCL OP Apply to eye daily.     latanoprost (XALATAN) 0.005 % ophthalmic solution 1 drop at bedtime.     LORazepam (ATIVAN) 0.5 MG tablet as needed.     meclizine (ANTIVERT) 25 MG tablet Take 25 mg by mouth 3 (three) times daily as needed. For dizziness/vertigo     Misc Natural Products (PUMPKIN SEED OIL PO) Take 1 capsule by mouth daily.     Nutritional Supplements (JUICE PLUS FIBRE PO) Take by mouth 2 (two) times daily.     Probiotic Product (PROBIOTIC ADVANCED PO) Take by mouth.     timolol (BETIMOL) 0.25 % ophthalmic solution 1-2 drops 2 (two) times daily.     Cholecalciferol (VITAMIN D3 HIGH POTENCY PO) Take 1,000 mcg by mouth daily.     amLODipine (NORVASC) 2.5 MG tablet Every morning (Patient not taking: Reported on 10/11/2020)     CINNAMON PO Take by mouth. (Patient not taking: Reported on 10/11/2020)     Flaxseed Oil OIL by Does not apply route daily. (Patient not  taking: Reported on 10/11/2020)     mirabegron ER (MYRBETRIQ) 25 MG TB24 tablet Take 1 tablet (25 mg total) by mouth daily. (Patient not taking: Reported on 10/11/2020) 30 tablet 5   No facility-administered medications prior to visit.     Final Medications at End of Visit    Current Meds  Medication Sig   acetaminophen (TYLENOL) 650 MG CR tablet Take 650 mg by mouth daily as needed (for headaches, discomfort or back pain).   aspirin EC 81 MG tablet Take 81 mg by mouth every other day.   AZO-CRANBERRY PO Take by mouth daily.   DORZOLAMIDE HCL OP Apply to  eye daily.   latanoprost (XALATAN) 0.005 % ophthalmic solution 1 drop at bedtime.   LORazepam (ATIVAN) 0.5 MG tablet as needed.   meclizine (ANTIVERT) 25 MG tablet Take 25 mg by mouth 3 (three) times daily as needed. For dizziness/vertigo   Misc Natural Products (PUMPKIN SEED OIL PO) Take 1 capsule by mouth daily.   Nutritional Supplements (JUICE PLUS FIBRE PO) Take by mouth 2 (two) times daily.   Probiotic Product (PROBIOTIC ADVANCED PO) Take by mouth.   timolol (BETIMOL) 0.25 % ophthalmic solution 1-2 drops 2 (two) times daily.   [DISCONTINUED] Cholecalciferol (VITAMIN D3 HIGH POTENCY PO) Take 1,000 mcg by mouth daily.     ROS:   Review of Systems  Constitutional: Negative for malaise/fatigue and weight gain.  Cardiovascular:  Positive for chest pain and palpitations. Negative for claudication, dyspnea on exertion, leg swelling, near-syncope, orthopnea, paroxysmal nocturnal dyspnea and syncope.  Respiratory:  Negative for shortness of breath.   Hematologic/Lymphatic: Does not bruise/bleed easily.  Gastrointestinal:  Negative for melena.  Neurological:  Positive for disturbances in coordination. Negative for dizziness and weakness.  Psychiatric/Behavioral:  Positive for depression and hypervigilance. The patient is nervous/anxious.    Objective:  Blood pressure (!) 155/82, pulse 74, temperature 98.1 F (36.7 C), height 5\' 4"   (1.626 m), weight 119 lb (54 kg), SpO2 99 %. Body mass index is 20.43 kg/m.  Vitals with BMI 10/11/2020 08/30/2020 08/10/2020  Height 5\' 4"  5\' 4"  5\' 4"   Weight 119 lbs 122 lbs 122 lbs  BMI 20.42 41.28 78.67  Systolic 672 094 709  Diastolic 82 74 70  Pulse 74 67 62    Physical Exam Vitals reviewed.  Constitutional:      General: She is not in acute distress.    Appearance: She is well-developed.  Neck:     Thyroid: No thyromegaly.     Vascular: No carotid bruit or JVD.  Cardiovascular:     Rate and Rhythm: Normal rate and regular rhythm.     Pulses: Intact distal pulses.     Heart sounds: Normal heart sounds, S1 normal and S2 normal. No murmur heard.   No gallop.  Pulmonary:     Effort: Pulmonary effort is normal. No respiratory distress.     Breath sounds: Normal breath sounds. No wheezing, rhonchi or rales.  Musculoskeletal:     Right lower leg: No edema.     Left lower leg: No edema.  Skin:    General: Skin is warm and dry.  Neurological:     General: No focal deficit present.     Mental Status: She is alert and oriented to person, place, and time.   Labs:   CMP Latest Ref Rng & Units 07/16/2016 07/16/2016 03/02/2015  Glucose 65 - 99 mg/dL 160(H) 162(H) -  BUN 6 - 20 mg/dL 7 5(L) -  Creatinine 0.44 - 1.00 mg/dL 0.60 0.67 -  Sodium 135 - 145 mmol/L 140 139 -  Potassium 3.5 - 5.1 mmol/L 4.1 4.1 -  Chloride 101 - 111 mmol/L 104 104 -  CO2 22 - 32 mmol/L - 25 -  Calcium 8.9 - 10.3 mg/dL - 9.6 -  Total Protein 6.5 - 8.1 g/dL - 6.7 6.5  Total Bilirubin 0.3 - 1.2 mg/dL - 0.7 -  Alkaline Phos 38 - 126 U/L - 84 -  AST 15 - 41 U/L - 73(H) -  ALT 14 - 54 U/L - 75(H) -   CBC Latest Ref Rng & Units 07/16/2016 07/16/2016 04/16/2012  WBC 4.0 - 10.5 K/uL - 9.0 10.2  Hemoglobin 12.0 - 15.0 g/dL 13.3 12.6 13.8  Hematocrit 36.0 - 46.0 % 39.0 37.1 39.8  Platelets 150 - 400 K/uL - 282 213   Lipid Panel     Component Value Date/Time   CHOL 218 (H) 12/27/2014 1320   TRIG 125  12/27/2014 1320   HDL 59 12/27/2014 1320   CHOLHDL 3.7 12/27/2014 1320   LDLCALC 134 (H) 12/27/2014 1320   HEMOGLOBIN A1C Lab Results  Component Value Date   HGBA1C 6.6 (H) 12/27/2014   TSH No results for input(s): TSH in the last 8760 hours.  External Labs:  Cholesterol, total 182.000 m 09/15/2019 HDL 57 MG/DL 09/15/2019 LDL 106.000 m 09/15/2019 Triglycerides 93.000 09/15/2019  A1C 6.300 % 09/15/2019 TSH 1.550 09/15/2019  Hemoglobin 14.200 g/d 09/15/2019  Creatinine, Serum 0.800 mg/ 09/15/2019 Potassium 4.100 mm 07/16/2016 Magnesium N/D ALT (SGPT) 47.000 uni 09/15/2019   Radiology:   CT Head 07/16/2016: No change from 01/06/2015 (small vessel disease).  1. No acute intracranial findings. 2. 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  Cardiac Studies    Echocardiogram 08/23/2020:  Normal LV systolic function with visual EF 55-60%. Left ventricle cavity is normal in size. Mild to moderate left ventricular hypertrophy. Normal global wall motion. Indeterminate diastolic filling pattern, elevated LAP.  Endocardial wires noted within the right cardiac chambers.  Mild (Grade I) mitral regurgitation.  Moderate tricuspid regurgitation.  Compared to prior study dated 05/27/2018: no significant change.  Lexiscan stress 02/07/12: Normal perfusion. No ischemia. Normal LVEF.  Device check: Pacemaker--St Jude Accent DR 2110 dual chamber pacemaker 04/15/2012  Scheduled remote pacemaker transmission 08/05/2020: Longevity >6 years. RA paced 1.2% and RV paced 1.3%. Brief AMS episodes, EGM suggest atrial tachycardia and noise. There were no high ventricular rate episodes. Normal pacemaker function. No change from prior evaluations.  Scheduled  In office pacemaker check 02/17/20  Single (S)/Dual (D)/BV: D. Presenting ASVS. Sinus @ 60/min Pacemaker dependant:  No. Underlying NSR. AP 1.4%, VP <1%.. AMS Episodes 3.  EGM suggest noise. HVR 0. Longevity 8 Years. Magnet rate:  >85%. Lead measurements: Stable. Histogram: Low (L)/normal (N)/high (H)  Normal. Patient activity Active.   Observations: Normal pacemaker function. Changes: None.   EKG:   08/11/2020: Atrial paced rhythm at a rate of 67 bpm.  Left bundle branch block with secondary ST-T wave changes.  No further analysis.  PCP EKG 08/04/2020: Sinus rhythm at a rate of 63 bpm.  Left bundle branch block with secondary ST-T changes.  No further analysis.  EKG 08/25/2017: Normal sinus rhythm at rate of 62 bpm, left bundle branch block.no further analysis  Assessment:   1. Left bundle branch block   2. Palpitations   3. Precordial pain    No orders of the defined types were placed in this encounter.  Medications Discontinued During This Encounter  Medication Reason   CINNAMON PO Error   Flaxseed Oil OIL Error   mirabegron ER (MYRBETRIQ) 25 MG TB24 tablet Error   amLODipine (NORVASC) 2.5 MG tablet Patient has not taken in last 30 days   Cholecalciferol (VITAMIN D3 HIGH POTENCY PO) Patient has not taken in last 30 days      Recommendations:   HPI: AUDREYANNA BUTKIEWICZ  is a 75 y.o. female  with sinus node dysfunction and also high degree AV block and recurrent syncope leading to permanent pacemaker implantation on 04/15/2012. Her past medical history is significant for hypertension, benign positional vertigo, left  bundle branch block, prediabetes mellitus.  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   She was seen in her PCPs office 08/04/2020 for complaint of heart racing symptoms.  EKG done that day revealed T wave inversions V5 and V6, therefore PCP recommended urgent evaluation in our office.  Pacemaker is functioning normally per interrogation on 08/04/20.  Further review of EKG from PCPs office as well as EKG today shows ST-T wave changes consistent with changes secondary to known left bundle branch block.    Patient presents for 6-week follow-up of precordial  pain and results of cardiac testing.  Reviewed and discussed with patient regarding results of echocardiogram, details above.  Echo revealed normal LVEF with mild to moderate LVH mild mitral regurgitation with moderate tricuspid regurgitation.  Echocardiogram is stable compared to previous and January 2020.  In regard to patient's chest pain symptoms appear more musculoskeletal, particularly when exacerbated with laying on her left side and laying with her left arm up.  However cannot rule out ischemia.  Therefore again discussed with patient regarding coronary CTA versus stress test for further ischemic evaluation.  Understands the risks of delaying further ischemic evaluation, however she does not wish to proceed with coronary CTA at this time given concern for reaction to contrast medium.  She also refuses to have nuclear stress test done in our office, and rather wishes to figure out where she had her last nuclear stress test done.  Patient states she will not have stress test done anymore there.  I informed patient I am happy to order stress test and refer her to the facility, however I have been a unable to discern where that was done previously and therefore must rely on her to let us know.  Patient will let our office know when she figures out where she would like a stress test done.  Also again discussed with patient at length regarding medical management of symptoms and empiric treatment with guideline directed medical therapy for CAD.  Discussed with patient Friday of medication management options including antianginal medications of amlodipine, beta-blocker therapy, nitrates.  Patient refuses to initiate any of the discussed therapies at this time.  She will instead go home and research carvedilol as well as statin therapy and ARB/ACE inhibitor's.  Patient does not wish to make any medication adjustments at this time, rather prefers to follow-up with her PCP to discuss further.  I would recommend  initiation of beta-blocker therapy if patient is willing to consider.  Advised patient that she may take Tylenol for chest and shoulder pain as needed.  As patient is resistant to current recommendations for medical therapy and we are awaiting information regarding where she would like her stress test done we will plan to follow-up in 6 months, unless needed sooner.  Follow up in 6 months, sooner if needed, for sinus node dysfunction, and precordial pain.    This was a 35-minute encounter with face-to-face counseling, medical records review, coordination of care, explanation of complex medical issues, complex medical decision making.  Majority of time was spent in patient education.    Alethia Berthold, PA-C 10/11/2020, 2:50 PM Office: [phone]

## 2020-10-11 ENCOUNTER — Other Ambulatory Visit: Payer: Self-pay

## 2020-10-11 ENCOUNTER — Ambulatory Visit: Payer: Medicare Other | Admitting: Student

## 2020-10-11 ENCOUNTER — Encounter: Payer: Self-pay | Admitting: Student

## 2020-10-11 VITALS — BP 155/82 | HR 74 | Temp 98.1°F | Ht 64.0 in | Wt 119.0 lb

## 2020-10-11 DIAGNOSIS — I447 Left bundle-branch block, unspecified: Secondary | ICD-10-CM | POA: Diagnosis not present

## 2020-10-11 DIAGNOSIS — R002 Palpitations: Secondary | ICD-10-CM | POA: Diagnosis not present

## 2020-10-11 DIAGNOSIS — R072 Precordial pain: Secondary | ICD-10-CM | POA: Diagnosis not present

## 2020-10-25 DIAGNOSIS — Z8673 Personal history of transient ischemic attack (TIA), and cerebral infarction without residual deficits: Secondary | ICD-10-CM | POA: Diagnosis not present

## 2020-10-25 DIAGNOSIS — Z950 Presence of cardiac pacemaker: Secondary | ICD-10-CM | POA: Diagnosis not present

## 2020-10-25 DIAGNOSIS — I10 Essential (primary) hypertension: Secondary | ICD-10-CM | POA: Diagnosis not present

## 2020-10-25 DIAGNOSIS — E785 Hyperlipidemia, unspecified: Secondary | ICD-10-CM | POA: Diagnosis not present

## 2020-10-25 DIAGNOSIS — R32 Unspecified urinary incontinence: Secondary | ICD-10-CM | POA: Diagnosis not present

## 2020-10-25 DIAGNOSIS — M7989 Other specified soft tissue disorders: Secondary | ICD-10-CM | POA: Diagnosis not present

## 2020-10-25 DIAGNOSIS — E1169 Type 2 diabetes mellitus with other specified complication: Secondary | ICD-10-CM | POA: Diagnosis not present

## 2020-10-25 DIAGNOSIS — Z1331 Encounter for screening for depression: Secondary | ICD-10-CM | POA: Diagnosis not present

## 2020-10-25 DIAGNOSIS — E559 Vitamin D deficiency, unspecified: Secondary | ICD-10-CM | POA: Diagnosis not present

## 2020-10-25 DIAGNOSIS — Z0000 Encounter for general adult medical examination without abnormal findings: Secondary | ICD-10-CM | POA: Diagnosis not present

## 2020-10-25 DIAGNOSIS — F419 Anxiety disorder, unspecified: Secondary | ICD-10-CM | POA: Diagnosis not present

## 2020-10-25 DIAGNOSIS — M8580 Other specified disorders of bone density and structure, unspecified site: Secondary | ICD-10-CM | POA: Diagnosis not present

## 2020-10-26 ENCOUNTER — Other Ambulatory Visit: Payer: Self-pay

## 2020-10-26 ENCOUNTER — Encounter: Payer: Self-pay | Admitting: Physical Therapy

## 2020-10-26 ENCOUNTER — Ambulatory Visit: Payer: Medicare Other | Attending: Obstetrics and Gynecology | Admitting: Physical Therapy

## 2020-10-26 DIAGNOSIS — M6281 Muscle weakness (generalized): Secondary | ICD-10-CM

## 2020-10-26 DIAGNOSIS — N3281 Overactive bladder: Secondary | ICD-10-CM | POA: Diagnosis present

## 2020-10-26 DIAGNOSIS — R278 Other lack of coordination: Secondary | ICD-10-CM
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
## 2020-10-26 NOTE — Patient Instructions (Signed)
Access Code: [email or web address] URL: [email or web address] Date: 10/26/2020 Prepared by: Earlie Counts  Program Notes go to the bathroom every 2 hours during the day   Exercises Supine Hip Adduction Isometric with Ball - 3 x daily - 7 x weekly - 1 sets - 5 reps - 5 sec hold [facility] [address] [place], [place] [address] Phone # [phone] Fax [phone]

## 2020-10-31 ENCOUNTER — Encounter: Payer: Medicare Other | Attending: Obstetrics and Gynecology | Admitting: Physical Therapy

## 2020-10-31 ENCOUNTER — Encounter: Payer: Self-pay | Admitting: Physical Therapy

## 2020-10-31 ENCOUNTER — Other Ambulatory Visit: Payer: Self-pay

## 2020-10-31 DIAGNOSIS — R269 Unspecified abnormalities of gait and mobility: Secondary | ICD-10-CM | POA: Insufficient documentation

## 2020-10-31 DIAGNOSIS — N3281 Overactive bladder: Secondary | ICD-10-CM | POA: Diagnosis present

## 2020-10-31 DIAGNOSIS — M6281 Muscle weakness (generalized): Secondary | ICD-10-CM

## 2020-10-31 DIAGNOSIS — R278 Other lack of coordination: Secondary | ICD-10-CM
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
## 2020-10-31 NOTE — Patient Instructions (Signed)
Access Code: [email or web address] URL: [email or web address] Date: 10/31/2020 Prepared by: Charlene Barker  Program Notes go to the bathroom every 2 hours during the day   Exercises Supine Hip Adduction Isometric with Ball - 3 x daily - 7 x weekly - 1 sets - 5 reps - 5 sec hold Supine Figure 4 Piriformis Stretch - 1 x daily - 7 x weekly - 1 sets - 1 reps - 30 sec hold Supine Hamstring Stretch - 1 x daily - 7 x weekly - 1 sets - 1 reps - 30 sec hold Sidelying Hip Adduction Isometric with Ball - 1 x daily - 7 x weekly - 1 sets - 5 reps - 5 sec hold Charlene Barker, PT [facility] [name] [place], [place] [address] W: [phone] [email or web address]

## 2020-11-07 ENCOUNTER — Encounter: Payer: Self-pay | Admitting: Physical Therapy

## 2020-11-07 ENCOUNTER — Encounter: Payer: Medicare Other | Admitting: Physical Therapy

## 2020-11-07 ENCOUNTER — Other Ambulatory Visit: Payer: Self-pay

## 2020-11-07 DIAGNOSIS — N3281 Overactive bladder: Secondary | ICD-10-CM

## 2020-11-07 DIAGNOSIS — M6281 Muscle weakness (generalized): Secondary | ICD-10-CM

## 2020-11-07 DIAGNOSIS — R269 Unspecified abnormalities of gait and mobility: Secondary | ICD-10-CM | POA: Diagnosis not present

## 2020-11-07 DIAGNOSIS — R278 Other lack of coordination: Secondary | ICD-10-CM
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
## 2020-11-09 DIAGNOSIS — R82998 Other abnormal findings in urine: Secondary | ICD-10-CM | POA: Diagnosis not present

## 2020-11-09 DIAGNOSIS — Z1212 Encounter for screening for malignant neoplasm of rectum: Secondary | ICD-10-CM | POA: Diagnosis not present

## 2020-11-14 ENCOUNTER — Encounter: Payer: Medicare Other | Admitting: Physical Therapy

## 2020-11-14 ENCOUNTER — Other Ambulatory Visit: Payer: Self-pay

## 2020-11-14 ENCOUNTER — Encounter: Payer: Self-pay | Admitting: Physical Therapy

## 2020-11-14 DIAGNOSIS — N3281 Overactive bladder: Secondary | ICD-10-CM

## 2020-11-14 DIAGNOSIS — R278 Other lack of coordination: Secondary | ICD-10-CM

## 2020-11-14 DIAGNOSIS — R269 Unspecified abnormalities of gait and mobility: Secondary | ICD-10-CM | POA: Diagnosis not present

## 2020-11-14 DIAGNOSIS — M6281 Muscle weakness (generalized): Secondary | ICD-10-CM

## 2020-11-14 NOTE — Patient Instructions (Addendum)
Bladder Irritants  Certain foods and beverages can be irritating to the bladder.  Avoiding these irritants may decrease your symptoms of urinary urgency, frequency or bladder pain.  Even reducing your intake can help with your symptoms.  Not everyone is sensitive to all bladder irritants, so you may consider focusing on one irritant at a time, removing or reducing your intake of that irritant for 7-10 days to see if this change helps your symptoms.  Water intake is also very important.  Below is a list of bladder irritants.  Drinks: alcohol, carbonated beverages, caffeinated beverages such as coffee and tea, drinks with artificial sweeteners, citrus juices, apple juice, tomato juice  Foods: tomatoes and tomato based foods, spicy food, sugar and artificial sweeteners, vinegar, chocolate, raw onion, apples, citrus fruits, pineapple, cranberries, tomatoes, strawberries, plums, peaches, cantaloupe  Other: acidic urine (too concentrated) - see water intake info below  Substitutes you can try that are NOT irritating to the bladder: cooked onion, pears, papayas, sun-brewed decaf teas, watermelons, non-citrus herbal teas, apricots, kava and low-acid instant drinks ([employer]).    WATER INTAKE: Remember to drink lots of water (aim for fluid intake of half your body weight with 2/3 of fluids being water).  You may be limiting fluids due to fear of leakage, but this can actually worsen urgency symptoms due to highly concentrated urine.  Water helps balance the pH of your urine so it doesn't become too acidic - acidic urine is a bladder irritant!  Access Code: [email or web address] URL: [email or web address] Date: 11/14/2020 Prepared by: Earlie Counts  Program Notes go to the bathroom every 2 hours during the day   Exercises Supine Figure 4 Piriformis Stretch - 1 x daily - 7 x weekly - 1 sets - 1 reps - 30 sec hold Supine Hamstring Stretch - 1 x daily - 7 x weekly - 1 sets - 1 reps - 30 sec  hold Sidelying Hip Adduction Isometric with Ball - 1 x daily - 7 x weekly - 1 sets - 5 reps - 5 sec hold Hooklying Isometric Hip Flexion - 1 x daily - 7 x weekly - 1 sets - 10 reps - 5 sec hold Hooklying Isometric Hip Flexion with Opposite Arm - 1 x daily - 7 x weekly - 1 sets - 10 reps - 5 sec hold Supine Bridge with Mini Swiss Ball Between Knees - 1 x daily - 7 x weekly - 1 sets - 15 reps  Earlie Counts, PT [facility] [address] [place], [place] [address] W: [phone] [email or web address]
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
## 2020-11-15 ENCOUNTER — Encounter: Payer: Self-pay | Admitting: Neurology

## 2020-11-15 ENCOUNTER — Ambulatory Visit (INDEPENDENT_AMBULATORY_CARE_PROVIDER_SITE_OTHER): Payer: Medicare Other | Admitting: Neurology

## 2020-11-15 VITALS — BP 146/78 | HR 67 | Ht 64.0 in | Wt 119.4 lb

## 2020-11-15 DIAGNOSIS — M48061 Spinal stenosis, lumbar region without neurogenic claudication: Secondary | ICD-10-CM | POA: Diagnosis not present

## 2020-11-15 DIAGNOSIS — R269 Unspecified abnormalities of gait and mobility: Secondary | ICD-10-CM
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
## 2020-11-21 DIAGNOSIS — Z20828 Contact with and (suspected) exposure to other viral communicable diseases: Secondary | ICD-10-CM | POA: Diagnosis not present

## 2020-11-21 DIAGNOSIS — Z1152 Encounter for screening for COVID-19: Secondary | ICD-10-CM | POA: Diagnosis not present

## 2020-11-21 DIAGNOSIS — B349 Viral infection, unspecified: Secondary | ICD-10-CM | POA: Diagnosis not present

## 2020-11-21 DIAGNOSIS — I10 Essential (primary) hypertension: Secondary | ICD-10-CM | POA: Diagnosis not present

## 2020-11-21 DIAGNOSIS — L0889 Other specified local infections of the skin and subcutaneous tissue: Secondary | ICD-10-CM | POA: Diagnosis not present

## 2020-11-21 DIAGNOSIS — R52 Pain, unspecified: Secondary | ICD-10-CM | POA: Diagnosis not present

## 2020-11-21 DIAGNOSIS — J3489 Other specified disorders of nose and nasal sinuses: Secondary | ICD-10-CM | POA: Diagnosis not present

## 2020-11-21 NOTE — Progress Notes (Deleted)
[facility] Return Visit  SUBJECTIVE  History of Present Illness: Charlene Barker is a 75 y.o. female seen in follow-up for overactive bladder. Plan at last visit was Myrbetriq '25mg'$  daily and to start physical therapy.     Past Medical History: Patient  has a past medical history of Anxiety, Atrioventricular block, complete -intermittent, Complete heart block ([place]) (04/14/2012), Diabetes mellitus without complication ([place]), Drug-induced hyperkalemia -associated with Aldactone, Encounter for care of pacemaker (10/27/2018), GERD (gastroesophageal reflux disease), Glaucoma, HTN (hypertension), Left bundle branch block, Lightheadedness, Oversensing on the atrial lead (08/27/2013), Pacemaker, Paresthesia of both legs (03/02/2015), Rheumatoid arthritis ([place]), Sinus node dysfunction ([place]) (02/17/2019), Spinal stenosis of lumbar region (03/02/2015), and Syncope.   Past Surgical History: She  has a past surgical history that includes Bladder surgery; loop recorder implant (N/A, 09/20/2011); permanent pacemaker insertion (N/A, 04/15/2012); and Eye surgery.   Medications: She has a current medication list which includes the following prescription(s): acetaminophen, aspirin ec, dorzolamide hcl, latanoprost, lorazepam, meclizine, misc natural products, nutritional supplements, phenazopyridine hcl, probiotic product, timolol, and UNABLE TO FIND.   Allergies: Patient is allergic to codeine, contrast media [iodinated diagnostic agents], penicillins, sulfa drugs cross reactors, wound dressing adhesive, chlorhexidine, latex, and povidone.   Social History: Patient  reports that she has never smoked. She has never used smokeless tobacco. She reports previous alcohol use. She reports that she does not use drugs.      OBJECTIVE     Physical Exam: There were no vitals filed for this visit. Gen: No apparent distress, A&O x 3.  Detailed Urogynecologic Evaluation:  Deferred. Prior exam showed:  No  flowsheet data found.     ASSESSMENT AND PLAN    Charlene Barker is a 75 y.o. with:  No diagnosis found.

## 2020-11-22 ENCOUNTER — Ambulatory Visit: Payer: 59 | Admitting: Obstetrics and Gynecology

## 2020-11-28 ENCOUNTER — Encounter: Payer: Medicare Other | Admitting: Physical Therapy

## 2020-12-05 DIAGNOSIS — H2511 Age-related nuclear cataract, right eye: Secondary | ICD-10-CM | POA: Diagnosis not present

## 2020-12-05 DIAGNOSIS — Z961 Presence of intraocular lens: Secondary | ICD-10-CM | POA: Diagnosis not present

## 2020-12-05 DIAGNOSIS — H401132 Primary open-angle glaucoma, bilateral, moderate stage: Secondary | ICD-10-CM | POA: Diagnosis not present

## 2020-12-05 DIAGNOSIS — H04123 Dry eye syndrome of bilateral lacrimal glands: Secondary | ICD-10-CM | POA: Diagnosis not present

## 2020-12-07 ENCOUNTER — Telehealth: Payer: Self-pay

## 2020-12-07 NOTE — Telephone Encounter (Signed)
Called patient regarding her missed remote transmissions. Overdue 104 days.   Charlene Barker home #: [phone] Charlene Barker cell #: [phone] Charlene Barker cell (spouse)#: [phone]  Also documented in [place].

## 2020-12-08 ENCOUNTER — Ambulatory Visit (INDEPENDENT_AMBULATORY_CARE_PROVIDER_SITE_OTHER): Payer: Medicare Other | Admitting: Obstetrics and Gynecology

## 2020-12-08 ENCOUNTER — Encounter: Payer: Self-pay | Admitting: Obstetrics and Gynecology

## 2020-12-08 ENCOUNTER — Other Ambulatory Visit: Payer: Self-pay

## 2020-12-08 VITALS — BP 163/81 | HR 85

## 2020-12-08 DIAGNOSIS — N3281 Overactive bladder: Secondary | ICD-10-CM

## 2020-12-08 MED ORDER — VIBEGRON 75 MG PO TABS: 1.0000 | ORAL_TABLET | Freq: Every day | ORAL | 5 refills | Status: DC

## 2020-12-08 NOTE — Progress Notes (Signed)
[facility] Return Visit  SUBJECTIVE  History of Present Illness: Charlene Barker is a 75 y.o. female seen in follow-up for overactive bladder. Plan at last visit was to start Myrbetriq '25mg'$ . She never picked up the medication.   Previously tried: vesicare, toviaz, oxybutynin for her symptoms.   Has been going to pelvic physical therapy which has been helpful but has been having trouble getting appointments.    Past Medical History: Patient  has a past medical history of Anxiety, Atrioventricular block, complete -intermittent, Complete heart block ([place]) (04/14/2012), Diabetes mellitus without complication ([place]), Drug-induced hyperkalemia -associated with Aldactone, Encounter for care of pacemaker (10/27/2018), GERD (gastroesophageal reflux disease), Glaucoma, HTN (hypertension), Left bundle branch block, Lightheadedness, Oversensing on the atrial lead (08/27/2013), Pacemaker, Paresthesia of both legs (03/02/2015), Rheumatoid arthritis ([place]), Sinus node dysfunction ([place]) (02/17/2019), Spinal stenosis of lumbar region (03/02/2015), and Syncope.   Past Surgical History: She  has a past surgical history that includes Bladder surgery; loop recorder implant (N/A, 09/20/2011); permanent pacemaker insertion (N/A, 04/15/2012); and Eye surgery.   Medications: She has a current medication list which includes the following prescription(s): acetaminophen, aspirin ec, dorzolamide hcl, latanoprost, lorazepam, meclizine, misc natural products, nutritional supplements, phenazopyridine hcl, probiotic product, timolol, UNABLE TO FIND, and vibegron.   Allergies: Patient is allergic to codeine, contrast media [iodinated diagnostic agents], penicillins, sulfa drugs cross reactors, wound dressing adhesive, chlorhexidine, latex, and povidone.   Social History: Patient  reports that she has never smoked. She has never used smokeless tobacco. She reports that she does not currently use alcohol. She reports  that she does not use drugs.      OBJECTIVE     Physical Exam: Vitals:   12/08/20 1146  BP: (!) 163/81  Pulse: 85   Gen: No apparent distress, A&O x 3.  Detailed Urogynecologic Evaluation:  Deferred. Prior exam showed: POP-Q:    POP-Q   -2.5                                            Aa   -2.5                                           Ba   -6                                              C    2                                            Gh   2.5                                            Pb   7.5  tvl    -2                                            Ap   -2                                            Bp   -7.5                                              D      ASSESSMENT AND PLAN    Charlene Barker is a 75 y.o. with:  1. Overactive bladder    - Due to elevated BP today, will not represcibe the myrbetriq. Not a candidate for anticholinergics due to her glaucoma. Prescribed Gemtasa '75mg'$  daily.  - Continue with PT- has several upcoming appointment scheduled.   Follow up in 6 weeks.   Jaquita Folds, MD  Time spent: I spent 25 minutes dedicated to the care of this patient on the date of this encounter to include pre-visit review of records, face-to-face time with the patient and post visit documentation and ordering medication/ testing.

## 2020-12-11 ENCOUNTER — Other Ambulatory Visit: Payer: Self-pay | Admitting: Obstetrics and Gynecology

## 2020-12-11 DIAGNOSIS — N3281 Overactive bladder: Secondary | ICD-10-CM

## 2020-12-12 ENCOUNTER — Other Ambulatory Visit: Payer: Self-pay

## 2020-12-12 ENCOUNTER — Encounter: Payer: Self-pay | Admitting: Physical Therapy

## 2020-12-12 ENCOUNTER — Encounter: Payer: Medicare Other | Attending: Physician Assistant | Admitting: Physical Therapy

## 2020-12-12 DIAGNOSIS — R278 Other lack of coordination: Secondary | ICD-10-CM | POA: Diagnosis not present

## 2020-12-12 DIAGNOSIS — N3281 Overactive bladder: Secondary | ICD-10-CM | POA: Diagnosis not present

## 2020-12-12 DIAGNOSIS — M6281 Muscle weakness (generalized): Secondary | ICD-10-CM | POA: Diagnosis not present

## 2020-12-12 MED ORDER — GEMTESA 75 MG PO TABS: 1.0000 | ORAL_TABLET | Freq: Every day | ORAL | 5 refills | Status: DC

## 2020-12-12 NOTE — Telephone Encounter (Signed)
PA was approved and approval was sent to the pharmacy
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
## 2020-12-12 NOTE — Patient Instructions (Signed)
Access Code: [email or web address] URL: [email or web address] Date: 12/12/2020 Prepared by: Earlie Counts  Program Notes go to the bathroom every 2 hours during the day   Exercises Supine Figure 4 Piriformis Stretch - 1 x daily - 4 x weekly - 1 sets - 1 reps - 30 sec hold Supine Hamstring Stretch - 1 x daily - 4 x weekly - 1 sets - 1 reps - 30 sec hold Sidelying Hip Adduction Isometric with Ball - 1 x daily - 4 x weekly - 1 sets - 5 reps - 5 sec hold Supine Bridge with Mini Swiss Ball Between Knees - 1 x daily - 4 x weekly - 1 sets - 15 reps Hooklying Isometric Hip Flexion - 1 x daily - 4 x weekly - 1 sets - 10 reps - 5 sec hold Hooklying Isometric Hip Flexion with Opposite Arm - 1 x daily - 4 x weekly - 1 sets - 10 reps - 5 sec hold Seated Pelvic Floor Contraction with Isometric Hip Adduction - 3 x daily - 7 x weekly - 1 sets - 10 reps - 1 sec hold Seated Pelvic Floor Contraction with Isometric Hip Adduction - 3 x daily - 7 x weekly - 1 sets - 5 reps - 5 sec hold  Earlie Counts, PT [facility] [address], [place], [place] [address] W: [phone] [email or web address]

## 2020-12-12 NOTE — Progress Notes (Signed)
Submitted PA for Gemtesa '75mg'$  on Cover my Meds. Key: [id number]  PA Case ID: [id number]  Status:Approved; Start Date:11/12/2020- Coverage End Date:04/28/2098  Pharmacy has been notified

## 2020-12-12 NOTE — Addendum Note (Signed)
Addended by: Jaquita Folds on: 12/12/2020 03:51 PM   Modules accepted: Orders

## 2020-12-15 ENCOUNTER — Telehealth: Payer: Self-pay | Admitting: Obstetrics and Gynecology

## 2020-12-15 NOTE — Telephone Encounter (Signed)
Spoke with pt who is concerned about the medication she was prescribed by her PCP, doxazosin. She states that she has had some side effects from it and is wanting them to lower the dosage. She is concerned it may cause her to have urinary retention. I advised that she should consult with the prescribing doctor about her concerns. I advised that gemtesa should not interfere with the medication.  Pt states that she didn't feel like she could start taking the gemtesa until she was able to get stabilized with the other prescribed med. I advised that she could just let us know when she is able to start taking it. Pt verbalized understanding.

## 2020-12-19 ENCOUNTER — Encounter: Payer: 59 | Admitting: Physical Therapy

## 2020-12-25 DIAGNOSIS — I5042 Chronic combined systolic (congestive) and diastolic (congestive) heart failure: Secondary | ICD-10-CM | POA: Diagnosis not present

## 2020-12-25 DIAGNOSIS — I428 Other cardiomyopathies: Secondary | ICD-10-CM | POA: Diagnosis not present

## 2020-12-26 ENCOUNTER — Other Ambulatory Visit: Payer: Self-pay

## 2020-12-26 ENCOUNTER — Encounter: Payer: Medicare Other | Admitting: Physical Therapy

## 2020-12-26 ENCOUNTER — Encounter: Payer: Self-pay | Admitting: Physical Therapy

## 2020-12-26 DIAGNOSIS — N3281 Overactive bladder: Secondary | ICD-10-CM | POA: Diagnosis not present

## 2020-12-26 DIAGNOSIS — R278 Other lack of coordination: Secondary | ICD-10-CM

## 2020-12-26 DIAGNOSIS — M6281 Muscle weakness (generalized): Secondary | ICD-10-CM

## 2020-12-26 NOTE — Patient Instructions (Signed)
Access Code: [email or web address] URL: [email or web address] Date: 12/26/2020 Prepared by: Earlie Counts  Program Notes go to the bathroom every 2 hours during the day   Exercises Sidelying Hip Adduction Isometric with Ball - 1 x daily - 4 x weekly - 1 sets - 5 reps - 5 sec hold Supine Bridge with Mini Swiss Ball Between Knees - 1 x daily - 4 x weekly - 1 sets - 15 reps Hooklying Isometric Hip Flexion - 1 x daily - 4 x weekly - 1 sets - 10 reps - 5 sec hold Hooklying Isometric Hip Flexion with Opposite Arm - 1 x daily - 4 x weekly - 1 sets - 10 reps - 5 sec hold Seated Pelvic Floor Contraction with Isometric Hip Adduction - 3 x daily - 7 x weekly - 1 sets - 10 reps - 1 sec hold Seated Pelvic Floor Contraction with Isometric Hip Adduction - 3 x daily - 7 x weekly - 1 sets - 5 reps - 5 sec hold Earlie Counts, PT [facility] [address], [place], [place] [address] W: [phone] [email or web address]
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
## 2021-01-08 DIAGNOSIS — Z45018 Encounter for adjustment and management of other part of cardiac pacemaker: Secondary | ICD-10-CM | POA: Diagnosis not present

## 2021-01-08 DIAGNOSIS — Z950 Presence of cardiac pacemaker: Secondary | ICD-10-CM | POA: Diagnosis not present

## 2021-01-08 DIAGNOSIS — I442 Atrioventricular block, complete: Secondary | ICD-10-CM | POA: Diagnosis not present

## 2021-01-18 ENCOUNTER — Encounter: Payer: Medicare Other | Attending: Physician Assistant | Admitting: Physical Therapy

## 2021-01-18 ENCOUNTER — Other Ambulatory Visit: Payer: Self-pay

## 2021-01-18 ENCOUNTER — Encounter: Payer: Self-pay | Admitting: Physical Therapy

## 2021-01-18 DIAGNOSIS — N3281 Overactive bladder: Secondary | ICD-10-CM | POA: Diagnosis not present

## 2021-01-18 DIAGNOSIS — M6281 Muscle weakness (generalized): Secondary | ICD-10-CM | POA: Diagnosis not present

## 2021-01-18 DIAGNOSIS — R278 Other lack of coordination: Secondary | ICD-10-CM | POA: Diagnosis not present
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
## 2021-01-18 NOTE — Patient Instructions (Signed)
Access Code: [email or web address] URL: [email or web address] Date: 01/18/2021 Prepared by: Earlie Counts  Program Notes go to the bathroom every 2 hours during the day   Exercises Seated Pelvic Floor Contraction with Isometric Hip Adduction - 3 x daily - 7 x weekly - 1 sets - 10 reps - 1 sec hold Seated Pelvic Floor Contraction with Isometric Hip Adduction - 3 x daily - 7 x weekly - 1 sets - 5 reps - 5 sec hold Supine Hip Adduction Isometric with Ball - 2 x daily - 7 x weekly - 1 sets - 10 reps Sidelying Hip Adduction Isometric with Ball - 1 x daily - 7 x weekly - 3 sets - 10 reps  Earlie Counts, PT [facility] [address], [place], [place] [address] W: [phone] [email or web address]

## 2021-02-01 ENCOUNTER — Encounter: Payer: Medicare Other | Admitting: Physical Therapy

## 2021-02-05 ENCOUNTER — Ambulatory Visit: Payer: 59 | Admitting: Obstetrics and Gynecology

## 2021-02-13 ENCOUNTER — Encounter: Payer: Medicare Other | Admitting: Physical Therapy

## 2021-02-16 ENCOUNTER — Ambulatory Visit: Payer: Medicare Other | Admitting: Cardiology

## 2021-02-16 ENCOUNTER — Encounter: Payer: Self-pay | Admitting: Cardiology

## 2021-02-16 ENCOUNTER — Other Ambulatory Visit: Payer: Self-pay

## 2021-02-16 VITALS — BP 169/84 | HR 67 | Temp 97.8°F | Resp 16 | Ht 64.0 in | Wt 121.4 lb

## 2021-02-16 DIAGNOSIS — Z8673 Personal history of transient ischemic attack (TIA), and cerebral infarction without residual deficits: Secondary | ICD-10-CM

## 2021-02-16 DIAGNOSIS — I442 Atrioventricular block, complete: Secondary | ICD-10-CM | POA: Diagnosis not present

## 2021-02-16 DIAGNOSIS — Z45018 Encounter for adjustment and management of other part of cardiac pacemaker: Secondary | ICD-10-CM

## 2021-02-16 DIAGNOSIS — Z950 Presence of cardiac pacemaker: Secondary | ICD-10-CM

## 2021-02-16 DIAGNOSIS — I10 Essential (primary) hypertension: Secondary | ICD-10-CM | POA: Diagnosis not present

## 2021-02-16 MED ORDER — LOSARTAN POTASSIUM 25 MG PO TABS: 25.0000 mg | ORAL_TABLET | Freq: Every day | ORAL | 2 refills | Status: DC

## 2021-02-16 NOTE — Progress Notes (Signed)
Subjective:  Primary Physician:  Crist Infante, MD  Patient ID: Charlene Barker, female    DOB: 1945/05/22, 75 y.o.   MRN: [id number]  Chief Complaint  Patient presents with   Hypertension   Pacemaker Check   LBBB   Follow-up    1 year    HPI: Charlene Barker  is a 75 y.o. female  with sinus node dysfunction and also high degree AV block and recurrent syncope leading to permanent pacemaker implantation on 04/15/2012, TIA in 2016.  Patient with history of prediabetes. She comes in for pacemaker check but also has been concerned about continued dizziness.  Patient states that she is dizzy all the time irrespective of her body position.  Denies any ringing in the ears or any new neurologic deficits.  She also complains of elevated blood pressure at home.  Past Medical History:  Diagnosis Date   Anxiety    Atrioventricular block, complete -intermittent        Complete heart block ([place]) 04/14/2012   Diabetes mellitus without complication ([place])    per patient.    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   Pacemaker    St Jude   Paresthesia of both legs 03/02/2015   Rheumatoid arthritis ([place])    Sinus node dysfunction ([place]) 02/17/2019   Spinal stenosis of lumbar region 03/02/2015   L4-5   Syncope     Past Surgical History:  Procedure Laterality Date   BLADDER SURGERY     sling   EYE SURGERY     several, bilateral   LOOP RECORDER IMPLANT N/A 09/20/2011   Procedure: LOOP RECORDER IMPLANT;  Surgeon: Deboraha Sprang, MD;  Location: [facility]ATH [facility];  Service: Cardiovascular;  Laterality: N/A;   PERMANENT PACEMAKER INSERTION N/A 04/15/2012   Family History  Problem Relation Age of Onset   Leukemia Mother    Stroke Father     Heart failure Father    Heart attack Father    Heart disease Father    Diabetes Sister    Asthma Brother    Restless legs syndrome Brother     Social History   Tobacco Use   Smoking status: Never   Smokeless tobacco: Never  Substance Use Topics   Alcohol use: Not Currently    Comment: wine only at [employer] time  Marital Status: Married    Allergies   Allergies  Allergen Reactions   Codeine Other (See Comments)    Makes the patient feel like she has "cotton mouth" and "weird"; ineffective, also   Contrast Media [Iodinated Diagnostic Agents] Other (See Comments)    Numbness, burning   Penicillins Other (See Comments)    "Childhood allergy" Has patient had a PCN reaction causing immediate rash, facial/tongue/throat swelling, SOB or lightheadedness with hypotension: Unk Has patient had a PCN reaction causing severe rash involving mucus membranes or skin necrosis: Unk Has patient had a PCN reaction that required hospitalization: Unk Has patient had a PCN reaction occurring within the last 10 years: No If all of the above answers are "NO", then may proceed with Cephalosporin use.    Sulfa Drugs Cross Reactors Other (See Comments)    Reaction unknown (allergy is from  childhood)   Wound Dressing Adhesive Other (See Comments)    Bandaids- Red, blisters   Chlorhexidine Rash   Latex Rash    No purple gloves   Povidone Rash      Medications Prior to Visit:   Outpatient Medications Prior to Visit  Medication Sig Dispense Refill   acetaminophen (TYLENOL) 650 MG CR tablet Take 650 mg by mouth daily as needed (for headaches, discomfort or back pain).     aspirin EC 81 MG tablet Take 81 mg by mouth every other day.     DORZOLAMIDE HCL OP Apply to eye daily.     latanoprost (XALATAN) 0.005 % ophthalmic solution 1 drop at bedtime.     LORazepam (ATIVAN) 0.5 MG tablet as needed.     meclizine (ANTIVERT) 25 MG tablet Take 25 mg by mouth 3 (three) times daily as needed. For  dizziness/vertigo     Misc Natural Products (PUMPKIN SEED OIL PO) Take 1 capsule by mouth daily.     Nutritional Supplements (JUICE PLUS FIBRE PO) Take by mouth 2 (two) times daily.     Phenazopyridine HCl (AZO TABS PO) Take by mouth as needed.     Probiotic Product (PROBIOTIC ADVANCED PO) Take by mouth.     timolol (BETIMOL) 0.25 % ophthalmic solution 1-2 drops 2 (two) times daily.     UNABLE TO FIND Med Name: pumpkin seed oil     Vibegron (GEMTESA) 75 MG TABS Take 1 tablet by mouth daily. 30 tablet 5   No facility-administered medications prior to visit.     Final Medications at End of Visit    Current Meds  Medication Sig   acetaminophen (TYLENOL) 650 MG CR tablet Take 650 mg by mouth daily as needed (for headaches, discomfort or back pain).   aspirin EC 81 MG tablet Take 81 mg by mouth every other day.   DORZOLAMIDE HCL OP Apply to eye daily.   latanoprost (XALATAN) 0.005 % ophthalmic solution 1 drop at bedtime.   LORazepam (ATIVAN) 0.5 MG tablet as needed.   losartan (COZAAR) 25 MG tablet Take 1 tablet (25 mg total) by mouth daily.   meclizine (ANTIVERT) 25 MG tablet Take 25 mg by mouth 3 (three) times daily as needed. For dizziness/vertigo   Misc Natural Products (PUMPKIN SEED OIL PO) Take 1 capsule by mouth daily.   Nutritional Supplements (JUICE PLUS FIBRE PO) Take by mouth 2 (two) times daily.   Phenazopyridine HCl (AZO TABS PO) Take by mouth as needed.   Probiotic Product (PROBIOTIC ADVANCED PO) Take by mouth.   timolol (BETIMOL) 0.25 % ophthalmic solution 1-2 drops 2 (two) times daily.   UNABLE TO FIND Med Name: pumpkin seed oil   Vibegron (GEMTESA) 75 MG TABS Take 1 tablet by mouth daily.     ROS:   Review of Systems  Cardiovascular:  Negative for chest pain, dyspnea on exertion and leg swelling.  Gastrointestinal:  Negative for melena.   Objective:  Blood pressure (!) 169/84, pulse 67, temperature 97.8 F (36.6 C), temperature source Temporal, resp. rate 16,  height 5\' 4"  (1.626 m), weight 121 lb 6.4 oz (55.1 kg), SpO2 99 %. Body mass index is 20.84 kg/m.  Vitals with BMI 02/16/2021 12/08/2020 11/15/2020  Height 5\' 4"  - 5\' 4"   Weight 121 lbs 6 oz - 119 lbs 6 oz  BMI 56.25 - 63.89  Systolic 373 428 768  Diastolic 84 81 78  Pulse 67 85 67    Physical Exam Vitals reviewed.  Constitutional:      General: She is not in acute distress.    Appearance: She is well-developed.  Neck:     Thyroid: No thyromegaly.     Vascular: No carotid bruit or JVD.  Cardiovascular:     Rate and Rhythm: Normal rate and regular rhythm.     Pulses: Intact distal pulses.     Heart sounds: Normal heart sounds, S1 normal and S2 normal. No murmur heard.   No gallop.  Pulmonary:     Effort: Pulmonary effort is normal. No respiratory distress.     Breath sounds: Normal breath sounds. No wheezing, rhonchi or rales.  Musculoskeletal:     Right lower leg: No edema.     Left lower leg: No edema.  Skin:    General: Skin is warm and dry.  Neurological:     General: No focal deficit present.     Mental Status: She is alert and oriented to person, place, and time.   Labs:   CMP Latest Ref Rng & Units 07/16/2016 07/16/2016 03/02/2015  Glucose 65 - 99 mg/dL 160(H) 162(H) -  BUN 6 - 20 mg/dL 7 5(L) -  Creatinine 0.44 - 1.00 mg/dL 0.60 0.67 -  Sodium 135 - 145 mmol/L 140 139 -  Potassium 3.5 - 5.1 mmol/L 4.1 4.1 -  Chloride 101 - 111 mmol/L 104 104 -  CO2 22 - 32 mmol/L - 25 -  Calcium 8.9 - 10.3 mg/dL - 9.6 -  Total Protein 6.5 - 8.1 g/dL - 6.7 6.5  Total Bilirubin 0.3 - 1.2 mg/dL - 0.7 -  Alkaline Phos 38 - 126 U/L - 84 -  AST 15 - 41 U/L - 73(H) -  ALT 14 - 54 U/L - 75(H) -   CBC Latest Ref Rng & Units 07/16/2016 07/16/2016 04/16/2012  WBC 4.0 - 10.5 K/uL - 9.0 10.2  Hemoglobin 12.0 - 15.0 g/dL 13.3 12.6 13.8  Hematocrit 36.0 - 46.0 % 39.0 37.1 39.8  Platelets 150 - 400 K/uL - 282 213   Lipid Panel     Component Value Date/Time   CHOL 218 (H) 12/27/2014 1320    TRIG 125 12/27/2014 1320   HDL 59 12/27/2014 1320   CHOLHDL 3.7 12/27/2014 1320   LDLCALC 134 (H) 12/27/2014 1320   HEMOGLOBIN A1C Lab Results  Component Value Date   HGBA1C 6.6 (H) 12/27/2014   TSH No results for input(s): TSH in the last 8760 hours.  External Labs:  Cholesterol, total 182.000 m 09/15/2019 HDL 57 MG/DL 09/15/2019 LDL 106.000 m 09/15/2019 Triglycerides 93.000 09/15/2019  A1C 6.300 % 09/15/2019 TSH 1.550 09/15/2019  Hemoglobin 14.200 g/d 09/15/2019  Creatinine, Serum 0.800 mg/ 09/15/2019 Potassium 4.100 mm 07/16/2016 Magnesium N/D ALT (SGPT) 47.000 uni 09/15/2019   Radiology:   CT Head 07/16/2016: No change from 01/06/2015 (small vessel disease).  1. No acute intracranial findings. 2. 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  Cardiac Studies    Echocardiogram 08/23/2020:  Normal LV systolic function with visual EF 55-60%. Left ventricle cavity is normal in size. Mild to moderate left ventricular hypertrophy. Normal global wall motion. Indeterminate diastolic filling pattern, elevated LAP.  Endocardial wires noted within the right cardiac chambers.  Mild (Grade I) mitral regurgitation.  Moderate tricuspid regurgitation.  Compared to prior study dated 05/27/2018: no significant change.  Lexiscan stress 02/07/12: Normal perfusion. No ischemia. Normal LVEF.  Device check: Pacemaker--St Jude Accent DR 2110 dual chamber pacemaker 04/15/2012   Remote pacemaker transmission 01/16/2021:  AP 2%, VP <1%.  There were frequent AMS episodes.  Mode switch <1%, AT AF burden <1%.  EGM = lead noise and artifact and brief atrial tachycardia.  There was 1 high ventricular rate episode no EGM.   Scheduled  In office pacemaker check 02/16/21  Single (S)/Dual (D)/BV: D. Presenting ASVS @ 65/min. Pacemaker dependant:  Not. Underlying  NSR. AP 6%, VP <1%.   AMS Episodes False mode switch = Lead noise (chronic).  HVR 0.  Longevity 3.5 Years. Magnet rate:  >85%. Histogram: Low (L)/normal (N)/high (H)  Normal. Patient activity Normal.   Observations: Normal pacemaker function. Changes: None.    EKG:    08/11/2020: Atrial paced rhythm at a rate of 67 bpm.  Left bundle branch block with secondary ST-T wave changes.  No further analysis.  Assessment:   1. Encounter for care of pacemaker   2. Pacemaker--St Jude Accent DR 2110 dual chamber pacemaker12/18/2013   3. Complete heart block (HCC)   4. 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 Primary hypertension    Meds ordered this encounter  Medications   losartan (COZAAR) 25 MG tablet    Sig: Take 1 tablet (25 mg total) by mouth daily.    Dispense:  30 tablet    Refill:  2    There are no discontinued medications.     Recommendations:   HPI: RINDY KOLLMAN  is a 75 y.o. female  with sinus node dysfunction and also high degree AV block and recurrent syncope leading to permanent pacemaker implantation on 04/15/2012. Her past medical history is significant for hypertension, benign positional vertigo, left bundle branch block, prediabetes mellitus and ? TIA in 2016 and chronic dizziness.  She comes in for pacemaker check but also has been concerned about continued dizziness.  Pacemaker is functioning normally, lead noise discussed with the patient which has been chronic.  She has not had any recurrence of strokelike symptoms, she has chronic dizziness unrelated to orthostasis.  She also states her blood pressure has been elevated.  She has been intolerant to many medications.  Advised her that we could try losartan at a very low dose and after long discussion she agrees and will let us know about the blood pressure.  If she is tolerating this we will make 90-day Rx.  With regard to old stroke, hyperlipidemia, she does not want to be on a statin.  I will see her back in 6 months for follow-up and will check orthostasis at that time.    Adrian Prows, PA-C 02/16/2021, 12:06 PM Office:  [phone]

## 2021-02-20 ENCOUNTER — Encounter: Payer: Medicare Other | Admitting: Physical Therapy

## 2021-02-27 ENCOUNTER — Ambulatory Visit: Payer: 59 | Admitting: Obstetrics and Gynecology

## 2021-02-27 DIAGNOSIS — H401132 Primary open-angle glaucoma, bilateral, moderate stage: Secondary | ICD-10-CM | POA: Diagnosis not present

## 2021-02-27 DIAGNOSIS — H2511 Age-related nuclear cataract, right eye: Secondary | ICD-10-CM | POA: Diagnosis not present

## 2021-02-27 DIAGNOSIS — Z961 Presence of intraocular lens: Secondary | ICD-10-CM | POA: Diagnosis not present

## 2021-03-01 ENCOUNTER — Encounter: Payer: 59 | Admitting: Physical Therapy

## 2021-03-08 ENCOUNTER — Encounter: Payer: 59 | Admitting: Physical Therapy

## 2021-03-15 ENCOUNTER — Encounter: Payer: 59 | Admitting: Physical Therapy

## 2021-03-20 ENCOUNTER — Encounter: Payer: 59 | Admitting: Physical Therapy

## 2021-03-26 DIAGNOSIS — M542 Cervicalgia: Secondary | ICD-10-CM | POA: Diagnosis not present

## 2021-03-26 DIAGNOSIS — M5451 Vertebrogenic low back pain: Secondary | ICD-10-CM | POA: Diagnosis not present

## 2021-03-27 ENCOUNTER — Encounter: Payer: 59 | Admitting: Physical Therapy

## 2021-03-28 ENCOUNTER — Ambulatory Visit (INDEPENDENT_AMBULATORY_CARE_PROVIDER_SITE_OTHER): Payer: Medicare Other | Admitting: Obstetrics and Gynecology

## 2021-03-28 ENCOUNTER — Other Ambulatory Visit: Payer: Self-pay

## 2021-03-28 ENCOUNTER — Encounter: Payer: Self-pay | Admitting: Obstetrics and Gynecology

## 2021-03-28 VITALS — BP 145/76 | HR 71

## 2021-03-28 DIAGNOSIS — N3281 Overactive bladder: Secondary | ICD-10-CM

## 2021-03-28 NOTE — Progress Notes (Signed)
[facility] Return Visit  SUBJECTIVE  History of Present Illness: KEERSTIN BJELLAND is a 75 y.o. female seen in follow-up for overactive bladder. Plan at last visit was to start Gemtesa 75mg .   Previously tried: vesicare, toviaz, oxybutynin, myrbetriq for her symptoms.   Logan Bores has been causing dizziness. Tried three times and still had the same symptoms. But she is also having issues with vertigo so is not able to differentiate if this is from the medication.  PT was helpful but no longer covered by Medicare.    Past Medical History: Patient  has a past medical history of Anxiety, Atrioventricular block, complete -intermittent, Complete heart block ([place]) (04/14/2012), Diabetes mellitus without complication ([place]), Drug-induced hyperkalemia -associated with Aldactone, Encounter for care of pacemaker (10/27/2018), GERD (gastroesophageal reflux disease), Glaucoma, HTN (hypertension), Left bundle branch block, Lightheadedness, Oversensing on the atrial lead (08/27/2013), Pacemaker, Paresthesia of both legs (03/02/2015), Rheumatoid arthritis ([place]), Sinus node dysfunction ([place]) (02/17/2019), Spinal stenosis of lumbar region (03/02/2015), and Syncope.   Past Surgical History: She  has a past surgical history that includes Bladder surgery; loop recorder implant (N/A, 09/20/2011); permanent pacemaker insertion (N/A, 04/15/2012); and Eye surgery.   Medications: She has a current medication list which includes the following prescription(s): acetaminophen, aspirin ec, dorzolamide hcl, latanoprost, lorazepam, losartan, meclizine, misc natural products, nutritional supplements, phenazopyridine hcl, probiotic product, timolol, UNABLE TO FIND, and gemtesa.   Allergies: Patient is allergic to codeine, contrast media [iodinated diagnostic agents], penicillins, sulfa drugs cross reactors, wound dressing adhesive, chlorhexidine, latex, and povidone.   Social History: Patient  reports that she has never  smoked. She has never used smokeless tobacco. She reports that she does not currently use alcohol. She reports that she does not use drugs.      OBJECTIVE     Physical Exam: Vitals:   03/28/21 1133  BP: (!) 145/76  Pulse: 71    Gen: No apparent distress, A&O x 3.  Detailed Urogynecologic Evaluation:  Deferred. Prior exam showed: POP-Q:    POP-Q   -2.5                                            Aa   -2.5                                           Ba   -6                                              C    2                                            Gh   2.5                                            Pb   7.5  tvl    -2                                            Ap   -2                                            Bp   -7.5                                              D      ASSESSMENT AND PLAN    Ms. Constantine is a 75 y.o. with:  1. Overactive bladder     - We discussed trying Gemtesa again to see if it causes similar side effects.  - We also discussed third line therapies- specifically cystoscopy with botox. Reviewed potential need to self-catheterize.  Handout provided and she will consider this option.   Follow up in 3 months or sooner if needed  Jaquita Folds, MD  Time spent: I spent 25 minutes dedicated to the care of this patient on the date of this encounter to include pre-visit review of records, face-to-face time with the patient and post visit documentation and ordering medication/ testing.

## 2021-04-27 DIAGNOSIS — I442 Atrioventricular block, complete: Secondary | ICD-10-CM | POA: Diagnosis not present

## 2021-04-27 DIAGNOSIS — L0889 Other specified local infections of the skin and subcutaneous tissue: Secondary | ICD-10-CM | POA: Diagnosis not present

## 2021-04-27 DIAGNOSIS — J3489 Other specified disorders of nose and nasal sinuses: Secondary | ICD-10-CM | POA: Diagnosis not present

## 2021-04-27 DIAGNOSIS — I6381 Other cerebral infarction due to occlusion or stenosis of small artery: Secondary | ICD-10-CM | POA: Diagnosis not present

## 2021-04-27 DIAGNOSIS — I10 Essential (primary) hypertension: Secondary | ICD-10-CM | POA: Diagnosis not present

## 2021-04-27 DIAGNOSIS — E1169 Type 2 diabetes mellitus with other specified complication: Secondary | ICD-10-CM | POA: Diagnosis not present

## 2021-04-27 DIAGNOSIS — F329 Major depressive disorder, single episode, unspecified: Secondary | ICD-10-CM | POA: Diagnosis not present

## 2021-04-27 DIAGNOSIS — G459 Transient cerebral ischemic attack, unspecified: Secondary | ICD-10-CM | POA: Diagnosis not present

## 2021-04-27 DIAGNOSIS — M542 Cervicalgia: Secondary | ICD-10-CM | POA: Diagnosis not present

## 2021-04-27 DIAGNOSIS — E785 Hyperlipidemia, unspecified: Secondary | ICD-10-CM | POA: Diagnosis not present

## 2021-05-29 ENCOUNTER — Encounter: Payer: Self-pay | Admitting: Neurology

## 2021-05-29 ENCOUNTER — Ambulatory Visit: Payer: Medicare Other | Admitting: Neurology

## 2021-05-29 DIAGNOSIS — H04123 Dry eye syndrome of bilateral lacrimal glands: Secondary | ICD-10-CM | POA: Diagnosis not present

## 2021-05-29 DIAGNOSIS — H2511 Age-related nuclear cataract, right eye: Secondary | ICD-10-CM | POA: Diagnosis not present

## 2021-05-29 DIAGNOSIS — Z961 Presence of intraocular lens: Secondary | ICD-10-CM | POA: Diagnosis not present

## 2021-05-29 DIAGNOSIS — H401132 Primary open-angle glaucoma, bilateral, moderate stage: Secondary | ICD-10-CM | POA: Diagnosis not present

## 2021-05-29 NOTE — Progress Notes (Deleted)
PATIENT: Charlene Barker DOB: June 08, 1945  REASON FOR VISIT: Follow up HISTORY FROM: Patient PRIMARY NEUROLOGIST:   HISTORY OF PRESENT ILLNESS: Today 05/29/21 Charlene Barker here today for follow-up with history of lumbar stenosis.   HISTORY 11/15/2020 Dr. Jannifer Franklin: [name] is a 76 year old left-handed white female with a history of lumbar spinal stenosis that is worse at the L4-5 level but she has some spinal stenosis as well at the L3-4 and L5-S1 levels as well.  The patient does not have a lot of back pain, she feels as if her balance is unsteady as if she may veer to one side to the other.  When walking long distances, she usually is holding onto a cart.  She does not want to use a cane.  We discussed referral to a neurosurgeon, but the patient does not wish to consider surgery.  She may have good and bad days with her ability to walk.  She has problems with glaucoma and has extreme sensitivity to light and has difficulty with vision because of this.  She is still operating a motor vehicle.  She denies any falls since last seen.  In the past, she has had episodes of vertigo previously, she has diet-controlled diabetes and some urinary incontinence.  She comes here for further evaluation.   REVIEW OF SYSTEMS: Out of a complete 14 system review of symptoms, the patient complains only of the following symptoms, and all other reviewed systems are negative.  ALLERGIES: Allergies  Allergen Reactions   Codeine Other (See Comments)    Makes the patient feel like she has "cotton mouth" and "weird"; ineffective, also   Contrast Media [Iodinated Contrast Media] Other (See Comments)    Numbness, burning   Penicillins Other (See Comments)    "Childhood allergy" Has patient had a PCN reaction causing immediate rash, facial/tongue/throat swelling, SOB or lightheadedness with hypotension: Unk Has patient had a PCN reaction causing severe rash involving mucus membranes or skin necrosis: Unk Has  patient had a PCN reaction that required hospitalization: Unk Has patient had a PCN reaction occurring within the last 10 years: No If all of the above answers are "NO", then may proceed with Cephalosporin use.    Sulfa Drugs Cross Reactors Other (See Comments)    Reaction unknown (allergy is from childhood)   Wound Dressing Adhesive Other (See Comments)    Bandaids- Red, blisters   Chlorhexidine Rash   Latex Rash    No purple gloves   Povidone Rash    HOME MEDICATIONS: Outpatient Medications Prior to Visit  Medication Sig Dispense Refill   acetaminophen (TYLENOL) 650 MG CR tablet Take 650 mg by mouth daily as needed (for headaches, discomfort or back pain).     aspirin EC 81 MG tablet Take 81 mg by mouth every other day.     DORZOLAMIDE HCL OP Apply to eye daily.     latanoprost (XALATAN) 0.005 % ophthalmic solution 1 drop at bedtime.     LORazepam (ATIVAN) 0.5 MG tablet as needed.     losartan (COZAAR) 25 MG tablet Take 1 tablet (25 mg total) by mouth daily. 30 tablet 2   meclizine (ANTIVERT) 25 MG tablet Take 25 mg by mouth 3 (three) times daily as needed. For dizziness/vertigo     Misc Natural Products (PUMPKIN SEED OIL PO) Take 1 capsule by mouth daily.     Nutritional Supplements (JUICE PLUS FIBRE PO) Take by mouth 2 (two) times daily.     Phenazopyridine HCl (  AZO TABS PO) Take by mouth as needed.     Probiotic Product (PROBIOTIC ADVANCED PO) Take by mouth.     timolol (BETIMOL) 0.25 % ophthalmic solution 1-2 drops 2 (two) times daily.     UNABLE TO FIND Med Name: pumpkin seed oil     Vibegron (GEMTESA) 75 MG TABS Take 1 tablet by mouth daily. 30 tablet 5   No facility-administered medications prior to visit.    PAST MEDICAL HISTORY: Past Medical History:  Diagnosis Date   Anxiety    Atrioventricular block, complete -intermittent        Complete heart block ([place]) 04/14/2012   Diabetes mellitus without complication ([place])    per patient.    Drug-induced hyperkalemia  -associated with Aldactone    Encounter for care of pacemaker 10/27/2018   GERD (gastroesophageal reflux disease)    Glaucoma    bilateral   HTN (hypertension)    Left bundle branch block    Lightheadedness    Associated with exercise   Oversensing on the atrial lead 08/27/2013   Pacemaker    St Jude   Paresthesia of both legs 03/02/2015   Rheumatoid arthritis ([place])    Sinus node dysfunction ([place]) 02/17/2019   Spinal stenosis of lumbar region 03/02/2015   L4-5   Syncope     PAST SURGICAL HISTORY: Past Surgical History:  Procedure Laterality Date   BLADDER SURGERY     sling   EYE SURGERY     several, bilateral   LOOP RECORDER IMPLANT N/A 09/20/2011   Procedure: LOOP RECORDER IMPLANT;  Surgeon: Deboraha Sprang, MD;  Location[facility] CATH LAB;  Service: Cardiovascular;  Laterality: N/A;   PERMANENT PACEMAKER INSERTION N/A 04/15/2012    FAMILY HISTORY: Family History  Problem Relation Age of Onset   Leukemia Mother    Stroke Father    Heart failure Father    Heart attack Father    Heart disease Father    Diabetes Sister    Asthma Brother    Restless legs syndrome Brother     SOCIAL HISTORY: Social History   Socioeconomic History   Marital status: Married    Spouse name: Jori Moll   Number of children: 1   Years of education: 13   Highest education level: Not on file  Occupational History   Occupation: Retired  Tobacco Use   Smoking status: Never   Smokeless tobacco: Never  Vaping Use   Vaping Use: Never used  Substance and Sexual Activity   Alcohol use: Not Currently    Comment: wine only at [employer] time   Drug use: No   Sexual activity: Not Currently    Birth control/protection: Post-menopausal  Other Topics Concern   Not on file  Social History Narrative   Lives at home with husband   caffeine use - chocolate mainly   Patient is left handed.    Social Determinants of Health   Financial Resource Strain: Not on file  Food Insecurity: Not on file   Transportation Needs: Not on file  Physical Activity: Not on file  Stress: Not on file  Social Connections: Not on file  Intimate Partner Violence: Not on file      PHYSICAL EXAM  There were no vitals filed for this visit. There is no height or weight on file to calculate BMI.  Generalized: Well developed, in no acute distress   Neurological examination  Mentation: Alert oriented to time, place, history taking. Follows all commands speech and language fluent Cranial  nerve II-XII: Pupils were equal round reactive to light. Extraocular movements were full, visual field were full on confrontational test. Facial sensation and strength were normal. Uvula tongue midline. Head turning and shoulder shrug  were normal and symmetric. Motor: The motor testing reveals 5 over 5 strength of all 4 extremities. Good symmetric motor tone is noted throughout.  Sensory: Sensory testing is intact to soft touch on all 4 extremities. No evidence of extinction is noted.  Coordination: Cerebellar testing reveals good finger-nose-finger and heel-to-shin bilaterally.  Gait and station: Gait is normal. Tandem gait is normal. Romberg is negative. No drift is seen.  Reflexes: Deep tendon reflexes are symmetric and normal bilaterally.   DIAGNOSTIC DATA (LABS, IMAGING, TESTING) - I reviewed patient records, labs, notes, testing and imaging myself where available.  Lab Results  Component Value Date   WBC 9.0 07/16/2016   HGB 13.3 07/16/2016   HCT 39.0 07/16/2016   MCV 91.2 07/16/2016   PLT 282 07/16/2016      Component Value Date/Time   NA 140 07/16/2016 1559   NA 141 02/15/2015 1443   K 4.1 07/16/2016 1559   CL 104 07/16/2016 1559   CO2 25 07/16/2016 1525   GLUCOSE 160 (H) 07/16/2016 1559   BUN 7 07/16/2016 1559   BUN 9 02/15/2015 1443   CREATININE 0.60 07/16/2016 1559   CALCIUM 9.6 07/16/2016 1525   PROT 6.7 07/16/2016 1525   PROT 6.5 03/02/2015 1116   ALBUMIN 4.2 07/16/2016 1525   ALBUMIN 4.4  02/15/2015 1443   AST 73 (H) 07/16/2016 1525   ALT 75 (H) 07/16/2016 1525   ALKPHOS 84 07/16/2016 1525   BILITOT 0.7 07/16/2016 1525   BILITOT 0.6 02/15/2015 1443   GFRNONAA >60 07/16/2016 1525   GFRAA >60 07/16/2016 1525   Lab Results  Component Value Date   CHOL 218 (H) 12/27/2014   HDL 59 12/27/2014   LDLCALC 134 (H) 12/27/2014   TRIG 125 12/27/2014   CHOLHDL 3.7 12/27/2014   Lab Results  Component Value Date   HGBA1C 6.6 (H) 12/27/2014   Lab Results  Component Value Date   VITAMINB12 701 07/13/2020   Lab Results  Component Value Date   TSH 1.889 04/14/2012      ASSESSMENT AND PLAN 76 y.o. year old female  has a past medical history of Anxiety, Atrioventricular block, complete -intermittent, Complete heart block ([place]) (04/14/2012), Diabetes mellitus without complication ([place]), Drug-induced hyperkalemia -associated with Aldactone, Encounter for care of pacemaker (10/27/2018), GERD (gastroesophageal reflux disease), Glaucoma, HTN (hypertension), Left bundle branch block, Lightheadedness, Oversensing on the atrial lead (08/27/2013), Pacemaker, Paresthesia of both legs (03/02/2015), Rheumatoid arthritis ([place]), Sinus node dysfunction ([place]) (02/17/2019), Spinal stenosis of lumbar region (03/02/2015), and Syncope. here with ***   I spent 15 minutes with the patient. 50% of this time was spent   Butler Denmark, [place], [place] 05/29/2021, 5:38 AM [facility] [address] [place],  [address] [phone]

## 2021-06-11 ENCOUNTER — Telehealth: Payer: Self-pay

## 2021-06-11 NOTE — Telephone Encounter (Signed)
Sent in error. Another message is being sent for the pt.

## 2021-06-11 NOTE — Telephone Encounter (Signed)
Doubt, she has had dizziness for a long time. Maybe related to ear problem?? She has not had her pacemaker transmitted also

## 2021-06-11 NOTE — Telephone Encounter (Signed)
Patient called to say that she has been experiencing vertigo since 06/03/21, she is dizzy all the time and she was wondering if it could be her losartan ?

## 2021-06-12 ENCOUNTER — Telehealth: Payer: Self-pay

## 2021-06-12 DIAGNOSIS — R42 Dizziness and giddiness: Secondary | ICD-10-CM | POA: Diagnosis not present

## 2021-06-12 DIAGNOSIS — H903 Sensorineural hearing loss, bilateral: Secondary | ICD-10-CM | POA: Diagnosis not present

## 2021-06-12 DIAGNOSIS — H838X3 Other specified diseases of inner ear, bilateral: Secondary | ICD-10-CM | POA: Diagnosis not present

## 2021-06-12 DIAGNOSIS — Z45018 Encounter for adjustment and management of other part of cardiac pacemaker: Secondary | ICD-10-CM | POA: Diagnosis not present

## 2021-06-12 DIAGNOSIS — I442 Atrioventricular block, complete: Secondary | ICD-10-CM | POA: Diagnosis not present

## 2021-06-12 NOTE — Telephone Encounter (Signed)
error 

## 2021-06-12 NOTE — Telephone Encounter (Signed)
Call from earlier: Patient called again, 1st : she wanted you know that she has already transmitted, as you requested, 2nd : she wanted you to know that she was stopping the Losartan because of the coughing side effect. 3rd : she already a schedule appointment for the vertigo.

## 2021-06-12 NOTE — Telephone Encounter (Signed)
Normal pacemaker function.

## 2021-06-13 NOTE — Telephone Encounter (Signed)
Pt called to inform us that the provider she saw for her vertigo yesterday, Dr. Leotis Pain, was unable to determine the cause of her dizziness. The provider has referred her to [facility] and she is going to fill out paper work so we can receive the records.

## 2021-06-14 ENCOUNTER — Telehealth: Payer: Self-pay | Admitting: Cardiology

## 2021-06-14 DIAGNOSIS — I10 Essential (primary) hypertension: Secondary | ICD-10-CM

## 2021-06-14 DIAGNOSIS — Z7982 Long term (current) use of aspirin: Secondary | ICD-10-CM | POA: Diagnosis not present

## 2021-06-14 DIAGNOSIS — I358 Other nonrheumatic aortic valve disorders: Secondary | ICD-10-CM | POA: Diagnosis not present

## 2021-06-14 DIAGNOSIS — H53129 Transient visual loss, unspecified eye: Secondary | ICD-10-CM | POA: Diagnosis not present

## 2021-06-14 DIAGNOSIS — I517 Cardiomegaly: Secondary | ICD-10-CM | POA: Diagnosis not present

## 2021-06-14 DIAGNOSIS — I447 Left bundle-branch block, unspecified: Secondary | ICD-10-CM | POA: Diagnosis not present

## 2021-06-14 DIAGNOSIS — H53121 Transient visual loss, right eye: Secondary | ICD-10-CM | POA: Diagnosis not present

## 2021-06-14 DIAGNOSIS — I6381 Other cerebral infarction due to occlusion or stenosis of small artery: Secondary | ICD-10-CM | POA: Diagnosis not present

## 2021-06-14 DIAGNOSIS — Z20822 Contact with and (suspected) exposure to covid-19: Secondary | ICD-10-CM | POA: Diagnosis not present

## 2021-06-14 DIAGNOSIS — G459 Transient cerebral ischemic attack, unspecified: Secondary | ICD-10-CM | POA: Diagnosis not present

## 2021-06-14 DIAGNOSIS — Z79899 Other long term (current) drug therapy: Secondary | ICD-10-CM | POA: Diagnosis not present

## 2021-06-14 DIAGNOSIS — R42 Dizziness and giddiness: Secondary | ICD-10-CM

## 2021-06-14 DIAGNOSIS — Z5181 Encounter for therapeutic drug level monitoring: Secondary | ICD-10-CM | POA: Diagnosis not present

## 2021-06-14 MED ORDER — LOSARTAN POTASSIUM 25 MG PO TABS: 12.5000 mg | ORAL_TABLET | Freq: Every evening | ORAL | 2 refills | Status: DC

## 2021-06-14 NOTE — Telephone Encounter (Signed)
Patient called in several times regarding dizziness, has been evaluated by PCP, no change in symptoms and was advised to discontinue losartan, today she noticed her blood pressure to be elevated and due to persistent dizziness, she was recommended to go to the emergency room to exclude CVA.  Patient called me from [facility] emergency room stating that she just had CT scan of the head and is awaiting the results.  Blood pressure remains elevated.  Symptoms are extremely vague.  Symptoms of dizziness more pronounced when she is walking around may suggest orthostatic hypotension.  However she now is worried about elevated blood pressure measuring to be 160 to 170 mmHg.  I advised her to change her losartan from 25 mg in the morning that she was previously taking and when the blood pressure was well controlled, to change it to 12.5 mg in the evening.  She also states that her right side vision is disturbed.  Advised her to mention this to the emergency room providers.  We will follow her up in the outpatient basis once evaluated in the emergency room and discharged.  I spent 15 minutes on the telephone.    ICD-10-CM   1. Dizziness and giddiness  R42     2. Primary hypertension  I10 losartan (COZAAR) 25 MG tablet     Meds ordered this encounter  Medications   losartan (COZAAR) 25 MG tablet    Sig: Take 0.5 tablets (12.5 mg total) by mouth at bedtime.    Dispense:  15 tablet    Refill:  2     Adrian Prows, MD, [facility] 06/14/2021, 4:37 PM Office: [phone] Fax: [phone] Pager: [phone]

## 2021-06-15 DIAGNOSIS — I517 Cardiomegaly: Secondary | ICD-10-CM | POA: Diagnosis not present

## 2021-06-15 DIAGNOSIS — H53129 Transient visual loss, unspecified eye: Secondary | ICD-10-CM | POA: Diagnosis not present

## 2021-06-15 DIAGNOSIS — R42 Dizziness and giddiness: Secondary | ICD-10-CM | POA: Diagnosis not present

## 2021-06-15 DIAGNOSIS — I358 Other nonrheumatic aortic valve disorders: Secondary | ICD-10-CM | POA: Diagnosis not present

## 2021-06-19 ENCOUNTER — Telehealth: Payer: Self-pay

## 2021-06-19 NOTE — Telephone Encounter (Signed)
Patient was seen in [place] for her dizziness, echo was performed they also asked her to take a statin but she refused and started taking red yeast rice, fish oil and a supplement for her eyes. I added these to her med list. She wanted to know if you could see the info from [facility]

## 2021-06-19 NOTE — Telephone Encounter (Signed)
Yes I can see

## 2021-06-20 ENCOUNTER — Ambulatory Visit: Payer: 59 | Admitting: Obstetrics and Gynecology

## 2021-06-21 DIAGNOSIS — E1169 Type 2 diabetes mellitus with other specified complication: Secondary | ICD-10-CM | POA: Diagnosis not present

## 2021-06-21 DIAGNOSIS — Z950 Presence of cardiac pacemaker: Secondary | ICD-10-CM | POA: Diagnosis not present

## 2021-06-21 DIAGNOSIS — F329 Major depressive disorder, single episode, unspecified: Secondary | ICD-10-CM | POA: Diagnosis not present

## 2021-06-21 DIAGNOSIS — I10 Essential (primary) hypertension: Secondary | ICD-10-CM | POA: Diagnosis not present

## 2021-06-21 DIAGNOSIS — I442 Atrioventricular block, complete: Secondary | ICD-10-CM | POA: Diagnosis not present

## 2021-06-21 DIAGNOSIS — G459 Transient cerebral ischemic attack, unspecified: Secondary | ICD-10-CM | POA: Diagnosis not present

## 2021-06-21 DIAGNOSIS — H8190 Unspecified disorder of vestibular function, unspecified ear: Secondary | ICD-10-CM | POA: Diagnosis not present

## 2021-06-21 DIAGNOSIS — E785 Hyperlipidemia, unspecified: Secondary | ICD-10-CM | POA: Diagnosis not present

## 2021-06-29 ENCOUNTER — Other Ambulatory Visit: Payer: Self-pay

## 2021-06-29 DIAGNOSIS — I10 Essential (primary) hypertension: Secondary | ICD-10-CM

## 2021-06-29 MED ORDER — LOSARTAN POTASSIUM 25 MG PO TABS: 25.0000 mg | ORAL_TABLET | Freq: Every evening | ORAL | 1 refills | Status: DC

## 2021-07-19 DIAGNOSIS — U071 COVID-19: Secondary | ICD-10-CM | POA: Diagnosis not present

## 2021-07-19 DIAGNOSIS — Z1152 Encounter for screening for COVID-19: Secondary | ICD-10-CM | POA: Diagnosis not present

## 2021-07-19 DIAGNOSIS — R0981 Nasal congestion: Secondary | ICD-10-CM | POA: Diagnosis not present

## 2021-07-19 DIAGNOSIS — E1169 Type 2 diabetes mellitus with other specified complication: Secondary | ICD-10-CM | POA: Diagnosis not present

## 2021-07-19 DIAGNOSIS — R5383 Other fatigue: Secondary | ICD-10-CM | POA: Diagnosis not present

## 2021-07-19 DIAGNOSIS — R6883 Chills (without fever): Secondary | ICD-10-CM | POA: Diagnosis not present

## 2021-07-19 DIAGNOSIS — J029 Acute pharyngitis, unspecified: Secondary | ICD-10-CM | POA: Diagnosis not present

## 2021-08-02 ENCOUNTER — Other Ambulatory Visit: Payer: Self-pay

## 2021-08-02 DIAGNOSIS — I10 Essential (primary) hypertension: Secondary | ICD-10-CM

## 2021-08-02 MED ORDER — LOSARTAN POTASSIUM 25 MG PO TABS: 25.0000 mg | ORAL_TABLET | Freq: Every evening | ORAL | 1 refills | Status: DC

## 2021-08-08 ENCOUNTER — Ambulatory Visit: Payer: Medicare Other | Admitting: Obstetrics and Gynecology

## 2021-08-16 ENCOUNTER — Ambulatory Visit: Payer: Medicare Other | Admitting: Cardiology

## 2021-08-16 ENCOUNTER — Encounter: Payer: Self-pay | Admitting: Cardiology

## 2021-08-16 VITALS — BP 130/68 | Temp 98.2°F | Resp 16 | Ht 64.0 in | Wt 125.8 lb

## 2021-08-16 DIAGNOSIS — Z950 Presence of cardiac pacemaker: Secondary | ICD-10-CM

## 2021-08-16 DIAGNOSIS — I10 Essential (primary) hypertension: Secondary | ICD-10-CM | POA: Diagnosis not present

## 2021-08-16 DIAGNOSIS — E785 Hyperlipidemia, unspecified: Secondary | ICD-10-CM | POA: Diagnosis not present

## 2021-08-16 DIAGNOSIS — I447 Left bundle-branch block, unspecified: Secondary | ICD-10-CM | POA: Diagnosis not present

## 2021-08-16 NOTE — Progress Notes (Signed)
? ?Subjective:  ?Primary Physician:  Crist Infante, MD ? ?Patient ID: Charlene Barker, female    DOB: 16-Aug-1945, 76 y.o.   MRN: [id number] ? ?Chief Complaint  ?Patient presents with  ?? Hypertension  ?? Dizziness  ? ? ?HPI: Charlene Barker  is a 76 y.o. female   female  with sinus node dysfunction and also high degree AV block and recurrent syncope leading to permanent pacemaker implantation on 04/15/2012. Her past medical history is significant for hypertension, benign positional vertigo, left bundle branch block, prediabetes mellitus and ? TIA in 2016 and chronic dizziness. ? ?She is presently doing well and for the first time states that her blood pressure has been controlled.  ? ?Past Medical History:  ?Diagnosis Date  ?? Anxiety   ?? Atrioventricular block, complete -intermittent   ?    ?? Complete heart block ([place]) 04/14/2012  ?? Diabetes mellitus without complication ([place])   ? per patient.   ?? Drug-induced hyperkalemia -associated with Aldactone   ?? Encounter for care of pacemaker 10/27/2018  ?? GERD (gastroesophageal reflux disease)   ?? Glaucoma   ? bilateral  ?? HTN (hypertension)   ?? Left bundle branch block   ?? Lightheadedness   ? Associated with exercise  ?? Oversensing on the atrial lead 08/27/2013  ?? Pacemaker   ? St Jude  ?? Paresthesia of both legs 03/02/2015  ?? Rheumatoid arthritis ([place])   ?? Sinus node dysfunction ([place]) 02/17/2019  ?? Spinal stenosis of lumbar region 03/02/2015  ? L4-5  ?? Syncope   ? ?Social History  ? ?Tobacco Use  ?? Smoking status: Never  ?? Smokeless tobacco: Never  ?Substance Use Topics  ?? Alcohol use: Not Currently  ?  Comment: wine only at christmas time  ?Marital Status: Married  ?  ?Allergies  ? ?Allergies  ?Allergen Reactions  ?? Codeine Other (See Comments)  ?  Makes the patient feel like she has "cotton mouth" and "weird"; ineffective, also  ?? Contrast Media [Iodinated Contrast Media] Other (See Comments)  ?  Numbness, burning  ?? Penicillins Other (See Comments)   ?  "Childhood allergy" ?Has patient had a PCN reaction causing immediate rash, facial/tongue/throat swelling, SOB or lightheadedness with hypotension: Unk ?Has patient had a PCN reaction causing severe rash involving mucus membranes or skin necrosis: Unk ?Has patient had a PCN reaction that required hospitalization: Unk ?Has patient had a PCN reaction occurring within the last 10 years: No ?If all of the above answers are "NO", then may proceed with Cephalosporin use. ?  ?? Sulfa Drugs Cross Reactors Other (See Comments)  ?  Reaction unknown (allergy is from childhood)  ?? Wound Dressing Adhesive Other (See Comments)  ?  Bandaids- Red, blisters  ?? Chlorhexidine Rash  ?? Latex Rash  ?  No purple gloves  ?? Povidone Rash  ?  ?Final Medications at End of Visit   ? ?Current Outpatient Medications:  ??  acetaminophen (TYLENOL) 650 MG CR tablet, Take 650 mg by mouth daily as needed (for headaches, discomfort or back pain)., Disp: , Rfl:  ??  aspirin EC 81 MG tablet, Take 81 mg by mouth every other day., Disp: , Rfl:  ??  DORZOLAMIDE HCL OP, Apply to eye daily., Disp: , Rfl:  ??  latanoprost (XALATAN) 0.005 % ophthalmic solution, 1 drop at bedtime., Disp: , Rfl:  ??  LORazepam (ATIVAN) 0.5 MG tablet, as needed., Disp: , Rfl:  ??  losartan (COZAAR) 25 MG tablet, Take 1 tablet (  25 mg total) by mouth at bedtime., Disp: 90 tablet, Rfl: 1 ??  meclizine (ANTIVERT) 25 MG tablet, Take 25 mg by mouth 3 (three) times daily as needed. For dizziness/vertigo, Disp: , Rfl:  ??  NON FORMULARY, Eye bright, Disp: , Rfl:  ??  Nutritional Supplements (JUICE PLUS FIBRE PO), Take by mouth 2 (two) times daily., Disp: , Rfl:  ??  Omega-3 Fatty Acids (OMEGA 3 500 PO), Take by mouth., Disp: , Rfl:  ??  Phenazopyridine HCl (AZO TABS PO), Take by mouth as needed., Disp: , Rfl:  ??  Probiotic Product (PROBIOTIC ADVANCED PO), Take by mouth., Disp: , Rfl:  ??  timolol (BETIMOL) 0.25 % ophthalmic solution, 1-2 drops 2 (two) times daily., Disp: , Rfl:   ??  UNABLE TO FIND, Med Name: pumpkin seed oil, Disp: , Rfl:  ??  Vibegron (GEMTESA) 75 MG TABS, Take 1 tablet by mouth daily. (Patient not taking: Reported on 08/16/2021), Disp: 30 tablet, Rfl: 5  ? ?ROS:  ? ?Review of Systems  ?Cardiovascular:  Negative for chest pain, dyspnea on exertion and leg swelling.  ?Gastrointestinal:  Negative for melena.   ?Objective:  ?Blood pressure 130/68, temperature 98.2 ?F (36.8 ?C), temperature source Temporal, resp. rate 16, height _0  (1.626 m), weight 125 lb 12.8 oz (57.1 kg), SpO2 100 %. Body mass index is 21.59 kg/m?.  ? ?  08/16/2021  ? 10:41 AM 03/28/2021  ? 11:33 AM 02/16/2021  ? 11:09 AM  ?Vitals with BMI  ?Height _1   _2   ?Weight 125 lbs 13 oz  121 lbs 6 oz  ?BMI 21.58  20.83  ?Systolic [phone]  ?Diastolic 68 76 84  ?Pulse  71 67  ?  ?Orthostatic VS for the past 72 hrs (Last 3 readings): ? Orthostatic BP Patient Position BP Location Cuff Size Orthostatic Pulse  ?08/16/21 1044 142/60 Standing Right Arm Normal 63  ?08/16/21 1043 142/62 Sitting Right Arm Normal 61  ?08/16/21 1042 140/63 Supine Right Arm Normal 66  ?  ?Physical Exam ?Neck:  ?   Vascular: No JVD.  ?Cardiovascular:  ?   Rate and Rhythm: Normal rate and regular rhythm.  ?   Pulses: Intact distal pulses.  ?   Heart sounds: Normal heart sounds. No murmur heard. ?  No gallop.  ?Pulmonary:  ?   Effort: Pulmonary effort is normal.  ?   Breath sounds: Normal breath sounds.  ?Abdominal:  ?   General: Bowel sounds are normal.  ?   Palpations: Abdomen is soft.  ?Musculoskeletal:  ?   Right lower leg: No edema.  ?   Left lower leg: No edema.  ? ?Labs:  ? ?External Labs: ?Labs 07/19/2021: ? ?A1c 5.8%.  TSH normal at 1.96.  Vitamin D normal at 45.1. ? ?Labs 09/29/2020: ? ?BUN 12, creatinine 0.7, EGFR 81 mL, potassium 4.8, LFTs normal. ? ?Hb 12.8/HCT 36.1, platelets 211, normal indicis. ? ?Total cholesterol 188, triglycerides  ? ? ?Radiology:  ? ?CT Head 07/16/2016: No change from 01/06/2015 (small vessel  disease). ? ?1. No acute intracranial findings. ?2. 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 ? ?Cardiac Studies   ? ?Echocardiogram 08/23/2020:  ?Normal LV systolic function with visual EF 55-60%. Left ventricle cavity is normal in size. Mild to moderate left ventricular hypertrophy. Normal global wall motion. Indeterminate diastolic filling pattern, elevated LAP.  ?Endocardial wires noted within the right cardiac chambers.  ?Mild (Grade I) mitral regurgitation.  ?Moderate tricuspid regurgitation.  ?  Compared to prior study dated 05/27/2018: no significant change. ? ?Lexiscan stress 02/07/12: Normal perfusion. No ischemia. Normal LVEF. ? ?Device check: Pacemaker--St Jude Accent DR 2110 dual chamber pacemaker 04/15/2012  ? ?Remote pacemaker transmissionRemote dual-chamber pacemaker transmission 06/12/2021: ?AP 5%, VP 2.3%. Longevity 2.5 to 3.3 years. Lead impedance and thresholds within normal limits. There are frequent mode switches, EGM = noise. Normal pacemaker function. ? ?Scheduled  In office pacemaker check 02/16/21  ?Single (S)/Dual (D)/BV: D. ?Presenting ASVS @ 65/min. ?Pacemaker dependant:  Not. Underlying  NSR. AP 6%, VP <1%.   ?AMS Episodes False mode switch = Lead noise (chronic).  ?HVR 0.  ?Longevity 3.5 Years. Magnet rate: >85%. ?Histogram: Low (L)/normal (N)/high (H)  Normal. Patient activity Normal.  ? ?Observations: Normal pacemaker function. Changes: None.  ? ?EKG:   ? ?EKG 08/10/2020: Atrially paced and ventricularly sensed rhythm, underlying left bundle branch block.  No significant change from 08/11/2020.  ? ?Assessment:  ? ?1. Primary hypertension   ?2. Dizziness and giddiness   ?3. Pacemaker--St Jude Accent DR 2110 dual chamber pacemaker12/18/2013   ?4. Left bundle branch block   ? ?No orders of the defined types were placed in this encounter. ? ?Medications Discontinued During This Encounter  ?Medication Reason  ?? Misc Natural Products (PUMPKIN SEED OIL PO)   ?? Red Yeast Rice  Extract (RED YEAST RICE PO)   ? ?Recommendations:  ? ?HPI: Charlene Barker  is a 76 y.o. female  with sinus node dysfunction and also high degree AV block and recurrent syncope leading to permanent pacemaker imp

## 2021-08-17 ENCOUNTER — Ambulatory Visit: Payer: Medicare Other | Admitting: Cardiology

## 2021-08-29 ENCOUNTER — Ambulatory Visit: Payer: Medicare Other | Admitting: Obstetrics and Gynecology

## 2021-09-25 ENCOUNTER — Other Ambulatory Visit: Payer: Self-pay | Admitting: Cardiology

## 2021-09-25 DIAGNOSIS — H401132 Primary open-angle glaucoma, bilateral, moderate stage: Secondary | ICD-10-CM | POA: Diagnosis not present

## 2021-09-25 DIAGNOSIS — Z961 Presence of intraocular lens: Secondary | ICD-10-CM | POA: Diagnosis not present

## 2021-09-25 DIAGNOSIS — I10 Essential (primary) hypertension: Secondary | ICD-10-CM

## 2021-09-25 DIAGNOSIS — H2511 Age-related nuclear cataract, right eye: Secondary | ICD-10-CM | POA: Diagnosis not present

## 2021-09-25 DIAGNOSIS — H04123 Dry eye syndrome of bilateral lacrimal glands: Secondary | ICD-10-CM | POA: Diagnosis not present

## 2021-09-26 DIAGNOSIS — L97521 Non-pressure chronic ulcer of other part of left foot limited to breakdown of skin: Secondary | ICD-10-CM | POA: Diagnosis not present

## 2021-09-26 DIAGNOSIS — E11621 Type 2 diabetes mellitus with foot ulcer: Secondary | ICD-10-CM | POA: Diagnosis not present

## 2021-10-15 NOTE — Telephone Encounter (Signed)
Error

## 2021-11-07 ENCOUNTER — Ambulatory Visit: Payer: Medicare Other | Admitting: Obstetrics and Gynecology

## 2021-11-09 ENCOUNTER — Telehealth: Payer: Self-pay

## 2021-11-09 ENCOUNTER — Ambulatory Visit (INDEPENDENT_AMBULATORY_CARE_PROVIDER_SITE_OTHER): Payer: Medicare Other | Admitting: Obstetrics and Gynecology

## 2021-11-09 ENCOUNTER — Encounter: Payer: Self-pay | Admitting: Obstetrics and Gynecology

## 2021-11-09 DIAGNOSIS — N3281 Overactive bladder: Secondary | ICD-10-CM | POA: Diagnosis not present

## 2021-11-09 MED ORDER — GEMTESA 75 MG PO TABS: 1.0000 | ORAL_TABLET | Freq: Every day | ORAL | 3 refills | Status: DC

## 2021-11-09 NOTE — Telephone Encounter (Signed)
She needs to transmit her pacemaker, she has not done it in almost 6 months

## 2021-11-09 NOTE — Progress Notes (Signed)
[facility] Return Visit  SUBJECTIVE  History of Present Illness: Charlene Barker is a 76 y.o. female seen in follow-up for overactive bladder. Plan at last visit was to start [facility] '75mg'$ .   Previously tried: vesicare, toviaz, oxybutynin, myrbetriq for her symptoms.   She is taking [place] It worked really well for a while. After a while, its not as consistent. She started taking it at night but wondering if it would work better if she takes it during the day. Seems to leak more when she drinks more water.   During the day, drinking 1-2 glasses decaf unsweet tea, 1-2 cup decaf coffee, water  Dizziness from vertigo has improved.   Past Medical History: Patient  has a past medical history of Anxiety, Atrioventricular block, complete -intermittent, Complete heart block ([name]) (04/14/2012), Diabetes mellitus without complication ([place]), Drug-induced hyperkalemia -associated with Aldactone, Encounter for care of pacemaker (10/27/2018), GERD (gastroesophageal reflux disease), Glaucoma, HTN (hypertension), Left bundle branch block, Lightheadedness, Oversensing on the atrial lead (08/27/2013), Pacemaker, Paresthesia of both legs (03/02/2015), Rheumatoid arthritis ([place]), Sinus node dysfunction ([place]) (02/17/2019), Spinal stenosis of lumbar region (03/02/2015), and Syncope.   Past Surgical History: She  has a past surgical history that includes Bladder surgery; loop recorder implant (N/A, 09/20/2011); permanent pacemaker insertion (N/A, 04/15/2012); and Eye surgery.   Medications: She has a current medication list which includes the following prescription(s): acetaminophen, aspirin ec, dorzolamide hcl, latanoprost, lorazepam, losartan, meclizine, NON FORMULARY, nutritional supplements, omega-3 fatty acids, phenazopyridine hcl, probiotic product, timolol, UNABLE TO FIND, and gemtesa.   Allergies: Patient is allergic to codeine, contrast media [iodinated contrast media], penicillins, sulfa drugs  cross reactors, wound dressing adhesive, chlorhexidine, latex, and povidone.   Social History: Patient  reports that she has never smoked. She has never used smokeless tobacco. She reports that she does not currently use alcohol. She reports that she does not use drugs.      OBJECTIVE     Physical Exam: Vitals:   11/09/21 1342  BP: 139/71  Pulse: 73    Gen: No apparent distress, A&O x 3.  Detailed Urogynecologic Evaluation:  Deferred. Prior exam showed: POP-Q:    POP-Q   -2.5                                            Aa   -2.5                                           Ba   -6                                              C    2                                            Gh   2.5  Pb   7.5                                            tvl    -2                                            Ap   -2                                            Bp   -7.5                                              D      ASSESSMENT AND PLAN    Charlene Barker is a 76 y.o. with:  1. Overactive bladder     - Continue with Gemtesa daily, refill provided - Avoid irritative beverages such as coffee and tea, list provided - No interested in Botox at this time.   Follow up in 6 months or sooner if needed  Jaquita Folds, MD  Time spent: I spent 25 minutes dedicated to the care of this patient on the date of this encounter to include pre-visit review of records, face-to-face time with the patient and post visit documentation and ordering medication/ testing.

## 2021-11-09 NOTE — Patient Instructions (Signed)

## 2021-11-12 DIAGNOSIS — Z45018 Encounter for adjustment and management of other part of cardiac pacemaker: Secondary | ICD-10-CM | POA: Diagnosis not present

## 2021-11-12 DIAGNOSIS — I442 Atrioventricular block, complete: Secondary | ICD-10-CM | POA: Diagnosis not present

## 2021-11-12 NOTE — Telephone Encounter (Signed)
Called patient, NA, LMAM

## 2021-11-13 ENCOUNTER — Telehealth: Payer: Self-pay

## 2021-11-13 NOTE — Telephone Encounter (Signed)
Letter has been mailed to patient

## 2021-11-13 NOTE — Telephone Encounter (Signed)
Patient transmitted.

## 2021-11-15 ENCOUNTER — Ambulatory Visit: Payer: Medicare Other | Admitting: Student

## 2021-11-15 ENCOUNTER — Encounter: Payer: Self-pay | Admitting: Student

## 2021-11-15 VITALS — BP 150/78 | HR 71 | Temp 97.8°F | Resp 16 | Ht 64.0 in | Wt 125.0 lb

## 2021-11-15 DIAGNOSIS — R42 Dizziness and giddiness: Secondary | ICD-10-CM | POA: Diagnosis not present

## 2021-11-15 DIAGNOSIS — I10 Essential (primary) hypertension: Secondary | ICD-10-CM

## 2021-11-15 MED ORDER — LOSARTAN POTASSIUM 50 MG PO TABS: 50.0000 mg | ORAL_TABLET | Freq: Every day | ORAL | 3 refills | Status: DC

## 2021-11-15 NOTE — Progress Notes (Signed)
Subjective:  Primary Physician:  Crist Infante, MD  Patient ID: Charlene Barker, female    DOB: 11-Jun-1945, 76 y.o.   MRN: [id number]  Chief Complaint  Patient presents with   Dizziness   Hypertension   Follow-up    HPI: Charlene Barker  is a 76 y.o. female   female  with sinus node dysfunction and also high degree AV block and recurrent syncope leading to permanent pacemaker implantation on 04/15/2012. Her past medical history is significant for hypertension, benign positional vertigo, left bundle branch block, prediabetes mellitus and ? TIA in 2016 and chronic dizziness.  Patient was last seen in the office 08/16/2021 by Dr. Einar Gip at which time she was started on red yeast rice for hyperlipidemia given that she refused to be on statin therapy or Zetia.  She was advised to follow-up in 1 year.  However patient now presents for urgent visit at her request with concerns of hypertension.  Patient brings with her a written log of home blood pressure readings which are averaging 591 mmHg systolic.  She admits to significant amount of stress in her life lately, particularly regarding family and family illnesses.  She recently transmitted pacemaker information on 11/12/2021 which revealed normal pacemaker function and only a single episode of SVT.  Patient reports episodes of anxiety ongoing over the last few weeks which are associated with palpitations.  She states symptoms resolved when she is able to calm her anxiousness.  She is requesting refill of lorazepam.  Notably patient is seen PCP in 2 weeks, including labs.  Past Medical History:  Diagnosis Date   Anxiety    Atrioventricular block, complete -intermittent        Complete heart block ([place]) 04/14/2012   Diabetes mellitus without complication ([place])    per patient.    Drug-induced hyperkalemia -associated with Aldactone    Encounter for care of pacemaker 10/27/2018   GERD (gastroesophageal reflux disease)    Glaucoma    bilateral    HTN (hypertension)    Left bundle branch block    Lightheadedness    Associated with exercise   Oversensing on the atrial lead 08/27/2013   Pacemaker    St Jude   Paresthesia of both legs 03/02/2015   Rheumatoid arthritis ([place])    Sinus node dysfunction ([place]) 02/17/2019   Spinal stenosis of lumbar region 03/02/2015   L4-5   Syncope    Social History   Tobacco Use   Smoking status: Never   Smokeless tobacco: Never  Substance Use Topics   Alcohol use: Not Currently    Comment: wine only at christmas time  Marital Status: Married    Allergies   Allergies  Allergen Reactions   Codeine Other (See Comments)    Makes the patient feel like she has "cotton mouth" and "weird"; ineffective, also   Contrast Media [Iodinated Contrast Media] Other (See Comments)    Numbness, burning   Penicillins Other (See Comments)    "Childhood allergy" Has patient had a PCN reaction causing immediate rash, facial/tongue/throat swelling, SOB or lightheadedness with hypotension: Unk Has patient had a PCN reaction causing severe rash involving mucus membranes or skin necrosis: Unk Has patient had a PCN reaction that required hospitalization: Unk Has patient had a PCN reaction occurring within the last 10 years: No If all of the above answers are "NO", then may proceed with Cephalosporin use.    Sulfa Drugs Cross Reactors Other (See Comments)    Reaction unknown (allergy is  from childhood)   Wound Dressing Adhesive Other (See Comments)    Bandaids- Red, blisters   Chlorhexidine Rash   Latex Rash    No purple gloves   Povidone Rash    Final Medications at End of Visit    Current Outpatient Medications:    acetaminophen (TYLENOL) 650 MG CR tablet, Take 650 mg by mouth daily as needed (for headaches, discomfort or back pain)., Disp: , Rfl:    aspirin EC 81 MG tablet, Take 81 mg by mouth every other day., Disp: , Rfl:    DORZOLAMIDE HCL OP, Apply to eye daily., Disp: , Rfl:    latanoprost (XALATAN)  0.005 % ophthalmic solution, 1 drop at bedtime., Disp: , Rfl:    LORazepam (ATIVAN) 0.5 MG tablet, as needed., Disp: , Rfl:    meclizine (ANTIVERT) 25 MG tablet, Take 25 mg by mouth 3 (three) times daily as needed. For dizziness/vertigo, Disp: , Rfl:    NON FORMULARY, Eye bright, Disp: , Rfl:    Nutritional Supplements (JUICE PLUS FIBRE PO), Take by mouth 2 (two) times daily., Disp: , Rfl:    Probiotic Product (PROBIOTIC ADVANCED PO), Take by mouth., Disp: , Rfl:    timolol (BETIMOL) 0.25 % ophthalmic solution, 1-2 drops 2 (two) times daily., Disp: , Rfl:    Vibegron (GEMTESA) 75 MG TABS, Take 1 tablet by mouth daily., Disp: 90 tablet, Rfl: 3   losartan (COZAAR) 50 MG tablet, Take 1 tablet (50 mg total) by mouth daily., Disp: 90 tablet, Rfl: 3   ROS:   Review of Systems  Cardiovascular:  Positive for palpitations (when anxious). Negative for chest pain, dyspnea on exertion and leg swelling.  Gastrointestinal:  Negative for melena.    Objective:  Blood pressure (!) 150/78, pulse 71, temperature 97.8 F (36.6 C), resp. rate 16, height 5' 4"  (1.626 m), weight 125 lb (56.7 kg), SpO2 99 %. Body mass index is 21.46 kg/m.     11/15/2021   11:32 AM 11/09/2021    1:42 PM 08/16/2021   10:41 AM  Vitals with BMI  Height 5' 4"   5' 4"   Weight 125 lbs  125 lbs 13 oz  BMI 52.84  13.24  Systolic 401 027 253  Diastolic 78 71 68  Pulse 71 73     Orthostatic VS for the past 72 hrs (Last 3 readings):  Orthostatic BP Patient Position BP Location Cuff Size Orthostatic Pulse  11/15/21 1144 152/78 Standing Right Arm Normal 72  11/15/21 1143 152/71 Sitting Right Arm Normal 70  11/15/21 1142 145/73 Supine Right Arm Normal 67      Physical Exam Vitals reviewed.  Neck:     Vascular: No JVD.  Cardiovascular:     Rate and Rhythm: Normal rate and regular rhythm.     Pulses: Intact distal pulses.     Heart sounds: Normal heart sounds. No murmur heard.    No gallop.  Pulmonary:     Effort: Pulmonary  effort is normal.     Breath sounds: Normal breath sounds.  Musculoskeletal:     Right lower leg: No edema.     Left lower leg: No edema.    Labs:   External Labs: Labs 07/19/2021:  A1c 5.8%.  TSH normal at 1.96.  Vitamin D normal at 45.1.  Labs 09/29/2020:  BUN 12, creatinine 0.7, EGFR 81 mL, potassium 4.8, LFTs normal.  Hb 12.8/HCT 36.1, platelets 211, normal indicis.  Total cholesterol 188, triglycerides    Radiology:   CT  Head 07/16/2016: No change from 01/06/2015 (small vessel disease).  1. No acute intracranial findings. 2. 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  Cardiac Studies    Echocardiogram 08/23/2020:  Normal LV systolic function with visual EF 55-60%. Left ventricle cavity is normal in size. Mild to moderate left ventricular hypertrophy. Normal global wall motion. Indeterminate diastolic filling pattern, elevated LAP.  Endocardial wires noted within the right cardiac chambers.  Mild (Grade I) mitral regurgitation.  Moderate tricuspid regurgitation.  Compared to prior study dated 05/27/2018: no significant change.  Lexiscan stress 02/07/12: Normal perfusion. No ischemia. Normal LVEF.  Device check: Pacemaker--St Jude Accent DR 2110 dual chamber pacemaker 04/15/2012   Remote dual-chamber pacemaker transmission 11/12/2021: AP 2.8%, VP 1%. Longevity 2 years and 5 months. Lead impedance and thresholds are normal. There are frequent RA noise elevations brief. There was 1 episode of high ventricular rate, EGM = SVT on 06/15/2021, duration not known. Normal pacemaker function. Similar finding was evident in April 2021 with SVT for 18 seconds.  Scheduled  In office pacemaker check 02/16/21  Single (S)/Dual (D)/BV: D. Presenting ASVS @ 65/min. Pacemaker dependant:  Not. Underlying  NSR. AP 6%, VP <1%.   AMS Episodes False mode switch = Lead noise (chronic).  HVR 0.  Longevity 3.5 Years. Magnet rate: >85%. Histogram: Low (L)/normal (N)/high (H)   Normal. Patient activity Normal.   Observations: Normal pacemaker function. Changes: None.   EKG:   11/15/2021: Sinus rhythm at a rate of 66 bpm.  Left axis. LBBB, no further analysis.  EKG 08/10/2020: Atrially paced and ventricularly sensed rhythm, underlying left bundle branch block.  No significant change from 08/11/2020.   Assessment:   1. Primary hypertension   2. Dizziness and giddiness    Meds ordered this encounter  Medications   losartan (COZAAR) 50 MG tablet    Sig: Take 1 tablet (50 mg total) by mouth daily.    Dispense:  90 tablet    Refill:  3    DX Code Needed  .   Medications Discontinued During This Encounter  Medication Reason   Omega-3 Fatty Acids (OMEGA 3 500 PO)    UNABLE TO FIND    Phenazopyridine HCl (AZO TABS PO)    losartan (COZAAR) 25 MG tablet    Recommendations:   HPI: JEWELIANA DUDGEON  is a 76 y.o. female  with sinus node dysfunction and also high degree AV block and recurrent syncope leading to permanent pacemaker implantation on 04/15/2012. Her past medical history is significant for hypertension, benign positional vertigo, left bundle branch block, prediabetes mellitus and ? TIA in 2016 and chronic dizziness.  Patient was last seen in the office 08/16/2021 by Dr. Einar Gip at which time she was started on red yeast rice for hyperlipidemia given that she refused to be on statin therapy or Zetia.  She was advised to follow-up in 1 year.  However patient now presents for urgent visit at her request with concerns of hypertension.  I personally reviewed home blood pressure readings, which are currently uncontrolled.  Suspect patient's significant life stress is contributing to anxiousness and therefore palpitations as well as elevated blood pressure.  We will increase losartan from 25 mg to 50 mg p.o. daily and will defer repeat BMP to PCP as patient already has labs scheduled for primary care next week.  Also advised patient to follow-up with primary care regarding  feelings of anxiety and her request for refill of lorazepam.  With regard to vertigo  and dizziness, she has remained stable, she is not orthostatic today.  I reviewed recent pacemaker transmission with patient, details above.   Patient will keep previously scheduled appointment with Dr. Einar Gip.  PCP will follow-up on labs and recheck of blood pressure.    Charlene Berthold, PA-C 11/15/2021, 1:37 PM Office: [phone]

## 2021-11-19 DIAGNOSIS — Z1212 Encounter for screening for malignant neoplasm of rectum: Secondary | ICD-10-CM | POA: Diagnosis not present

## 2021-11-19 DIAGNOSIS — R5383 Other fatigue: Secondary | ICD-10-CM | POA: Diagnosis not present

## 2021-11-19 DIAGNOSIS — F419 Anxiety disorder, unspecified: Secondary | ICD-10-CM | POA: Diagnosis not present

## 2021-11-19 DIAGNOSIS — E559 Vitamin D deficiency, unspecified: Secondary | ICD-10-CM | POA: Diagnosis not present

## 2021-11-19 DIAGNOSIS — R739 Hyperglycemia, unspecified: Secondary | ICD-10-CM | POA: Diagnosis not present

## 2021-11-19 DIAGNOSIS — I10 Essential (primary) hypertension: Secondary | ICD-10-CM | POA: Diagnosis not present

## 2021-11-19 DIAGNOSIS — Z0000 Encounter for general adult medical examination without abnormal findings: Secondary | ICD-10-CM | POA: Diagnosis not present

## 2021-11-19 DIAGNOSIS — R7989 Other specified abnormal findings of blood chemistry: Secondary | ICD-10-CM | POA: Diagnosis not present

## 2021-11-19 DIAGNOSIS — E785 Hyperlipidemia, unspecified: Secondary | ICD-10-CM | POA: Diagnosis not present

## 2021-11-26 DIAGNOSIS — Z1339 Encounter for screening examination for other mental health and behavioral disorders: Secondary | ICD-10-CM | POA: Diagnosis not present

## 2021-11-26 DIAGNOSIS — F329 Major depressive disorder, single episode, unspecified: Secondary | ICD-10-CM | POA: Diagnosis not present

## 2021-11-26 DIAGNOSIS — R82998 Other abnormal findings in urine: Secondary | ICD-10-CM | POA: Diagnosis not present

## 2021-11-26 DIAGNOSIS — E785 Hyperlipidemia, unspecified: Secondary | ICD-10-CM | POA: Diagnosis not present

## 2021-11-26 DIAGNOSIS — I10 Essential (primary) hypertension: Secondary | ICD-10-CM | POA: Diagnosis not present

## 2021-11-26 DIAGNOSIS — Z8673 Personal history of transient ischemic attack (TIA), and cerebral infarction without residual deficits: Secondary | ICD-10-CM | POA: Diagnosis not present

## 2021-11-26 DIAGNOSIS — M8580 Other specified disorders of bone density and structure, unspecified site: Secondary | ICD-10-CM | POA: Diagnosis not present

## 2021-11-26 DIAGNOSIS — R739 Hyperglycemia, unspecified: Secondary | ICD-10-CM | POA: Diagnosis not present

## 2021-11-26 DIAGNOSIS — E1169 Type 2 diabetes mellitus with other specified complication: Secondary | ICD-10-CM | POA: Diagnosis not present

## 2021-11-26 DIAGNOSIS — Z950 Presence of cardiac pacemaker: Secondary | ICD-10-CM | POA: Diagnosis not present

## 2021-11-26 DIAGNOSIS — Z1331 Encounter for screening for depression: Secondary | ICD-10-CM | POA: Diagnosis not present

## 2021-11-26 DIAGNOSIS — F419 Anxiety disorder, unspecified: Secondary | ICD-10-CM | POA: Diagnosis not present

## 2021-11-26 DIAGNOSIS — G459 Transient cerebral ischemic attack, unspecified: Secondary | ICD-10-CM | POA: Diagnosis not present

## 2021-11-26 DIAGNOSIS — Z0000 Encounter for general adult medical examination without abnormal findings: Secondary | ICD-10-CM | POA: Diagnosis not present

## 2021-11-26 DIAGNOSIS — I6381 Other cerebral infarction due to occlusion or stenosis of small artery: Secondary | ICD-10-CM | POA: Diagnosis not present

## 2021-12-11 DIAGNOSIS — H401132 Primary open-angle glaucoma, bilateral, moderate stage: Secondary | ICD-10-CM | POA: Diagnosis not present

## 2021-12-11 DIAGNOSIS — H04123 Dry eye syndrome of bilateral lacrimal glands: Secondary | ICD-10-CM | POA: Diagnosis not present

## 2021-12-17 DIAGNOSIS — M545 Low back pain, unspecified: Secondary | ICD-10-CM | POA: Diagnosis not present

## 2021-12-28 ENCOUNTER — Telehealth: Payer: Self-pay | Admitting: Obstetrics and Gynecology

## 2021-12-28 NOTE — Telephone Encounter (Signed)
pt requests to cx appt due to billing concerns. She states the billing statement envelopes and returned envelopes should be sent to her under Dr Sherlene Shams, not [facility]. Pt states she does not like the way processing is done.

## 2022-01-03 DIAGNOSIS — Z01419 Encounter for gynecological examination (general) (routine) without abnormal findings: Secondary | ICD-10-CM | POA: Diagnosis not present

## 2022-01-03 DIAGNOSIS — Z1231 Encounter for screening mammogram for malignant neoplasm of breast: Secondary | ICD-10-CM | POA: Diagnosis not present

## 2022-01-09 DIAGNOSIS — R42 Dizziness and giddiness: Secondary | ICD-10-CM | POA: Diagnosis not present

## 2022-01-09 DIAGNOSIS — H9202 Otalgia, left ear: Secondary | ICD-10-CM | POA: Diagnosis not present

## 2022-01-09 DIAGNOSIS — F458 Other somatoform disorders: Secondary | ICD-10-CM | POA: Diagnosis not present

## 2022-01-09 DIAGNOSIS — M26609 Unspecified temporomandibular joint disorder, unspecified side: Secondary | ICD-10-CM | POA: Diagnosis not present

## 2022-01-09 DIAGNOSIS — L739 Follicular disorder, unspecified: Secondary | ICD-10-CM | POA: Diagnosis not present

## 2022-01-22 DIAGNOSIS — M5451 Vertebrogenic low back pain: Secondary | ICD-10-CM | POA: Diagnosis not present

## 2022-01-22 DIAGNOSIS — M542 Cervicalgia: Secondary | ICD-10-CM | POA: Diagnosis not present

## 2022-02-06 DIAGNOSIS — H2511 Age-related nuclear cataract, right eye: Secondary | ICD-10-CM | POA: Diagnosis not present

## 2022-02-06 DIAGNOSIS — H04123 Dry eye syndrome of bilateral lacrimal glands: Secondary | ICD-10-CM | POA: Diagnosis not present

## 2022-02-06 DIAGNOSIS — H401132 Primary open-angle glaucoma, bilateral, moderate stage: Secondary | ICD-10-CM | POA: Diagnosis not present

## 2022-02-14 DIAGNOSIS — M5451 Vertebrogenic low back pain: Secondary | ICD-10-CM | POA: Diagnosis not present

## 2022-02-15 DIAGNOSIS — N760 Acute vaginitis: Secondary | ICD-10-CM | POA: Diagnosis not present

## 2022-02-15 DIAGNOSIS — R32 Unspecified urinary incontinence: Secondary | ICD-10-CM | POA: Diagnosis not present

## 2022-02-21 DIAGNOSIS — M5451 Vertebrogenic low back pain: Secondary | ICD-10-CM | POA: Diagnosis not present

## 2022-02-23 DIAGNOSIS — H40003 Preglaucoma, unspecified, bilateral: Secondary | ICD-10-CM | POA: Diagnosis not present

## 2022-02-28 DIAGNOSIS — M5451 Vertebrogenic low back pain: Secondary | ICD-10-CM | POA: Diagnosis not present

## 2022-03-06 DIAGNOSIS — M5451 Vertebrogenic low back pain: Secondary | ICD-10-CM | POA: Diagnosis not present

## 2022-03-07 DIAGNOSIS — M8589 Other specified disorders of bone density and structure, multiple sites: Secondary | ICD-10-CM | POA: Diagnosis not present

## 2022-03-12 NOTE — Progress Notes (Signed)
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 Presenting ***. Pacemaker dependant:  ***. Underlying ***. AP ***%, VP ***%. BP ***%. AMS Episodes ***.  AT/AF burden ***% . Longest ***. Latest ***. HVR ***. Longest ***. Latest ***. Longevity *** Years. Magnet rate: >85%. Lead measurements: Stable. Thoracic impedance: ***. Histogram: Low (L)/normal (N)/high (H)  ***. Patient activity ***.   Observations: ***. Changes: ***.

## 2022-03-13 ENCOUNTER — Ambulatory Visit: Payer: Medicare Other | Admitting: Cardiology

## 2022-03-13 DIAGNOSIS — Z45018 Encounter for adjustment and management of other part of cardiac pacemaker: Secondary | ICD-10-CM | POA: Diagnosis not present

## 2022-03-13 DIAGNOSIS — Z8673 Personal history of transient ischemic attack (TIA), and cerebral infarction without residual deficits: Secondary | ICD-10-CM

## 2022-03-13 DIAGNOSIS — Z950 Presence of cardiac pacemaker: Secondary | ICD-10-CM

## 2022-03-13 DIAGNOSIS — G453 Amaurosis fugax: Secondary | ICD-10-CM

## 2022-03-13 DIAGNOSIS — I442 Atrioventricular block, complete: Secondary | ICD-10-CM | POA: Diagnosis not present

## 2022-03-14 DIAGNOSIS — M5451 Vertebrogenic low back pain: Secondary | ICD-10-CM | POA: Diagnosis not present

## 2022-03-19 DIAGNOSIS — J069 Acute upper respiratory infection, unspecified: Secondary | ICD-10-CM | POA: Diagnosis not present

## 2022-03-19 DIAGNOSIS — J309 Allergic rhinitis, unspecified: Secondary | ICD-10-CM | POA: Diagnosis not present

## 2022-03-26 DIAGNOSIS — M5451 Vertebrogenic low back pain: Secondary | ICD-10-CM | POA: Diagnosis not present

## 2022-03-27 DIAGNOSIS — R32 Unspecified urinary incontinence: Secondary | ICD-10-CM | POA: Diagnosis not present

## 2022-04-04 DIAGNOSIS — M5451 Vertebrogenic low back pain: Secondary | ICD-10-CM | POA: Diagnosis not present

## 2022-04-09 DIAGNOSIS — Z45018 Encounter for adjustment and management of other part of cardiac pacemaker: Secondary | ICD-10-CM | POA: Diagnosis not present

## 2022-04-09 DIAGNOSIS — Z961 Presence of intraocular lens: Secondary | ICD-10-CM | POA: Diagnosis not present

## 2022-04-09 DIAGNOSIS — H2511 Age-related nuclear cataract, right eye: Secondary | ICD-10-CM | POA: Diagnosis not present

## 2022-04-09 DIAGNOSIS — H401132 Primary open-angle glaucoma, bilateral, moderate stage: Secondary | ICD-10-CM | POA: Diagnosis not present

## 2022-04-09 DIAGNOSIS — I442 Atrioventricular block, complete: Secondary | ICD-10-CM | POA: Diagnosis not present

## 2022-04-09 DIAGNOSIS — H04123 Dry eye syndrome of bilateral lacrimal glands: Secondary | ICD-10-CM | POA: Diagnosis not present

## 2022-04-10 ENCOUNTER — Telehealth: Payer: Self-pay

## 2022-04-10 NOTE — Telephone Encounter (Signed)
Patient called and stated that her brother is dying and she has been having severe anxiety and left arm pain. She has asked that you look at her recent transmission. Please advise.   Spoke with JG:  Per JG, patient has not had to use her pacemaker. Everything looks normal.   Tried to call patient back, NA, LMAM.

## 2022-04-11 DIAGNOSIS — M5451 Vertebrogenic low back pain: Secondary | ICD-10-CM | POA: Diagnosis not present

## 2022-05-22 ENCOUNTER — Ambulatory Visit: Payer: 59 | Admitting: Obstetrics and Gynecology

## 2022-06-18 ENCOUNTER — Telehealth: Payer: Self-pay | Admitting: Cardiology

## 2022-06-18 NOTE — Telephone Encounter (Signed)
Patient was in office today with her husband, I have discussed results with her.

## 2022-06-20 DIAGNOSIS — H401194 Primary open-angle glaucoma, unspecified eye, indeterminate stage: Secondary | ICD-10-CM | POA: Diagnosis not present

## 2022-06-27 DIAGNOSIS — I6389 Other cerebral infarction: Secondary | ICD-10-CM | POA: Diagnosis not present

## 2022-06-27 DIAGNOSIS — Z8673 Personal history of transient ischemic attack (TIA), and cerebral infarction without residual deficits: Secondary | ICD-10-CM | POA: Diagnosis not present

## 2022-06-27 DIAGNOSIS — R079 Chest pain, unspecified: Secondary | ICD-10-CM | POA: Diagnosis not present

## 2022-06-27 DIAGNOSIS — M26609 Unspecified temporomandibular joint disorder, unspecified side: Secondary | ICD-10-CM | POA: Diagnosis not present

## 2022-06-27 DIAGNOSIS — E1169 Type 2 diabetes mellitus with other specified complication: Secondary | ICD-10-CM | POA: Diagnosis not present

## 2022-06-27 DIAGNOSIS — M8580 Other specified disorders of bone density and structure, unspecified site: Secondary | ICD-10-CM | POA: Diagnosis not present

## 2022-06-27 DIAGNOSIS — G459 Transient cerebral ischemic attack, unspecified: Secondary | ICD-10-CM | POA: Diagnosis not present

## 2022-06-27 DIAGNOSIS — Z950 Presence of cardiac pacemaker: Secondary | ICD-10-CM | POA: Diagnosis not present

## 2022-06-27 DIAGNOSIS — I10 Essential (primary) hypertension: Secondary | ICD-10-CM | POA: Diagnosis not present

## 2022-06-27 DIAGNOSIS — E785 Hyperlipidemia, unspecified: Secondary | ICD-10-CM | POA: Diagnosis not present

## 2022-06-27 DIAGNOSIS — F329 Major depressive disorder, single episode, unspecified: Secondary | ICD-10-CM | POA: Diagnosis not present

## 2022-06-27 DIAGNOSIS — I442 Atrioventricular block, complete: Secondary | ICD-10-CM | POA: Diagnosis not present

## 2022-07-08 ENCOUNTER — Ambulatory Visit: Payer: Medicare Other | Admitting: Cardiology

## 2022-07-08 ENCOUNTER — Encounter: Payer: Self-pay | Admitting: Cardiology

## 2022-07-08 VITALS — BP 127/70 | HR 75 | Resp 16 | Ht 64.0 in | Wt 125.0 lb

## 2022-07-08 DIAGNOSIS — R0789 Other chest pain: Secondary | ICD-10-CM | POA: Diagnosis not present

## 2022-07-08 DIAGNOSIS — I442 Atrioventricular block, complete: Secondary | ICD-10-CM

## 2022-07-08 DIAGNOSIS — I10 Essential (primary) hypertension: Secondary | ICD-10-CM | POA: Diagnosis not present

## 2022-07-08 DIAGNOSIS — Z950 Presence of cardiac pacemaker: Secondary | ICD-10-CM | POA: Diagnosis not present

## 2022-07-08 NOTE — Progress Notes (Signed)
Subjective:  Primary Physician:  Crist Infante, MD  Patient ID: Charlene Barker, female    DOB: [birth year], 77 y.o.   MRN: [id number]  Chief Complaint  Patient presents with   Chest Pain   Follow-up    HPI: Charlene Barker  is a 77 y.o. female  with sinus node dysfunction and also high degree AV block and recurrent syncope leading to permanent pacemaker implantation on 04/15/2012. Her past medical history is significant for hypertension, hyperglycemia, benign positional vertigo, left bundle branch block and ? TIA in 2016 and chronic dizziness.  This is a 53-monthoffice visit, states that about 4 to 6 weeks ago she started noticing left-sided chest pain, that started after she was working in the yard cutting rosebushes, and has had episodes with or without exertion activity, lasting few seconds send sometimes several minutes.  He is always located on the left side.  Sometimes it radiates to her left side of her thigh, left arm, also left shoulder.  Sharp in nature.  No other associated symptoms. She denies dyspnea on exertion, palpitations, dizziness or syncope and states that overall otherwise presently doing well except for anxiety and stress in view of her brother having health issues as well.  Past Medical History:  Diagnosis Date   Anxiety    Atrioventricular block, complete -intermittent        Complete heart block ([place] 04/14/2012   Diabetes mellitus without complication ([facility]    per patient.    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   Pacemaker    St Jude   Paresthesia of both legs 03/02/2015   Rheumatoid arthritis ([place]    Sinus node dysfunction ([place] 02/17/2019   Spinal stenosis of lumbar region 03/02/2015    L4-5   Syncope    Social History   Tobacco Use   Smoking status: Never   Smokeless tobacco: Never  Substance Use Topics   Alcohol use: Not Currently    Comment: wine only at christmas time  Marital Status: Married    Allergies   Allergies  Allergen Reactions   Codeine Other (See Comments)    Makes the patient feel like she has "cotton mouth" and "weird"; ineffective, also   Contrast Media [Iodinated Contrast Media] Other (See Comments)    Numbness, burning   Penicillins Other (See Comments)    "Childhood allergy" Has patient had a PCN reaction causing immediate rash, facial/tongue/throat swelling, SOB or lightheadedness with hypotension: Unk Has patient had a PCN reaction causing severe rash involving mucus membranes or skin necrosis: Unk Has patient had a PCN reaction that required hospitalization: Unk Has patient had a PCN reaction occurring within the last 10 years: No If all of the above answers are "NO", then may proceed with Cephalosporin use.    Sulfa Drugs Cross Reactors Other (See Comments)    Reaction unknown (allergy is from childhood)   Wound Dressing Adhesive Other (See Comments)    Bandaids- Red, blisters   Chlorhexidine Rash   Latex Rash    No purple gloves   Povidone Rash    Final Medications at End of Visit    Current Outpatient Medications:    acetaminophen (TYLENOL) 650 MG  CR tablet, Take 650 mg by mouth daily as needed (for headaches, discomfort or back pain)., Disp: , Rfl:    aspirin EC 81 MG tablet, Take 81 mg by mouth every other day., Disp: , Rfl:    DORZOLAMIDE HCL OP, Apply to eye daily., Disp: , Rfl:    latanoprost (XALATAN) 0.005 % ophthalmic solution, 1 drop at bedtime., Disp: , Rfl:    LORazepam (ATIVAN) 0.5 MG tablet, as needed., Disp: , Rfl:    losartan (COZAAR) 50 MG tablet, Take 1 tablet (50 mg total) by mouth daily. (Patient taking differently: Take 75 mg by mouth daily.), Disp: 90 tablet, Rfl: 3   meclizine (ANTIVERT) 25 MG tablet,  Take 25 mg by mouth 3 (three) times daily as needed. For dizziness/vertigo, Disp: , Rfl:    NON FORMULARY, Eye bright, Disp: , Rfl:    timolol (BETIMOL) 0.25 % ophthalmic solution, 1-2 drops 2 (two) times daily., Disp: , Rfl:    Vibegron (GEMTESA) 75 MG TABS, Take 1 tablet by mouth daily., Disp: 90 tablet, Rfl: 3   Cholecalciferol 50 MCG (2000 UT) CAPS, Take by oral route., Disp: , Rfl:    Nutritional Supplements (JUICE PLUS FIBRE PO), Take by mouth 2 (two) times daily. (Patient not taking: Reported on 07/08/2022), Disp: , Rfl:    Probiotic Product (PROBIOTIC ADVANCED PO), Take by mouth. (Patient not taking: Reported on 07/08/2022), Disp: , Rfl:    ROS:   Review of Systems  Cardiovascular:  Positive for chest pain. Negative for dyspnea on exertion, leg swelling and palpitations.  Gastrointestinal:  Negative for melena.    Objective:  Blood pressure 127/70, pulse 75, resp. rate 16, height '5\' 4"'$  (1.626 m), weight 125 lb (56.7 kg), SpO2 96 %. Body mass index is 21.46 kg/m.     07/08/2022    2:21 PM 07/08/2022    2:15 PM 11/15/2021   11:32 AM  Vitals with BMI  Height  '5\' 4"'$  '5\' 4"'$   Weight  125 lbs 125 lbs  BMI  [id number]  Systolic [id number]  Diastolic 70 76 78  Pulse 75 79 71    Orthostatic VS for the past 72 hrs (Last 3 readings):  Patient Position BP Location Cuff Size  07/08/22 1421 Sitting Left Arm Normal  07/08/22 1415 Sitting Right Arm Normal      Physical Exam Vitals reviewed.  Neck:     Vascular: No JVD.  Cardiovascular:     Rate and Rhythm: Normal rate and regular rhythm.     Pulses: Intact distal pulses.     Heart sounds: Normal heart sounds. No murmur heard.    No gallop.  Pulmonary:     Effort: Pulmonary effort is normal.     Breath sounds: Normal breath sounds.  Chest:     Chest wall: Tenderness (Left costochondral junction) present.  Abdominal:     General: Bowel sounds are normal.     Palpations: Abdomen is soft.  Musculoskeletal:     Right lower leg:  No edema.     Left lower leg: No edema.    Labs:   External Labs:   Cholesterol, total 195.000 m 11/19/2021 HDL 73.000 mg 11/19/2021 LDL 100.000 m 11/19/2021 Triglycerides 109.000 m 11/19/2021  A1C 6.200 % 11/19/2021 TSH 1.870 11/20/2021  Labs 07/19/2021:  A1c 5.8%.  TSH normal at 1.96.  Vitamin D normal at 45.1.  Labs 09/29/2020:  BUN 12, creatinine 0.7, EGFR 81 mL, potassium 4.8, LFTs normal.  Hb 12.8/HCT 36.1, platelets  211, normal indicis.  Total cholesterol 188, triglycerides    Radiology:   CT Head 07/16/2016: No change from 01/06/2015 (small vessel disease).  1. No acute intracranial findings. 2. 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  Cardiac Studies    Echocardiogram 08/23/2020:  Normal LV systolic function with visual EF 55-60%. Left ventricle cavity is normal in size. Mild to moderate left ventricular hypertrophy. Normal global wall motion. Indeterminate diastolic filling pattern, elevated LAP.  Endocardial wires noted within the right cardiac chambers.  Mild (Grade I) mitral regurgitation.  Moderate tricuspid regurgitation.  Compared to prior study dated 05/27/2018: no significant change.  Lexiscan stress 02/07/12: Normal perfusion. No ischemia. Normal LVEF.  Device check: Pacemaker--St Jude Accent DR 2110 dual chamber pacemaker 04/15/2012   Remote dual-chamber pacemaker transmission 04/09/2022: Longevity 2 years and 2 months.  AP 2.8%, VP 1%.  Brief mode switches, longevity 2 years and 5 months.  Lead impedance and thresholds are normal.  There are frequent RA noise, brief.  No further SVT since 06/15/2021 duration not known.  Normal pacemaker function.     EKG:    EKG 11/15/2021: Sinus rhythm at a rate of 66 bpm.  Left axis. LBBB, no further analysis.  EKG 08/10/2020: Atrially paced and ventricularly sensed rhythm, underlying left bundle branch block.  No significant change from 08/11/2020.   Assessment:   1. Complete heart block  ([place])   2. Primary hypertension   3. Pacemaker--St Jude Accent DR 2110 dual chamber pacemaker12/18/2013   4. Chest pain, musculoskeletal    No orders of the defined types were placed in this encounter.  There are no discontinued medications.  Recommendations:   HPI: Charlene Barker  is a 77 y.o. female  with sinus node dysfunction and also high degree AV block and recurrent syncope leading to permanent pacemaker implantation on 04/15/2012. Her past medical history is significant for hypertension, hyperglycemia, benign positional vertigo, left bundle branch block and ? TIA in 2016 and chronic dizziness.  1. Complete heart block ([place]) Patient previously has had syncope and complete heart block, he is SP permanent pacemaker implantation, has not had any further recurrence of syncope.  2. Primary hypertension Blood pressure under excellent control on losartan, continue the same. Labs reviewed.  Renal function is normal.  3. Pacemaker--St Jude Accent DR 2110 dual chamber pacemaker12/18/2013 Pacemaker is functioning normally.  She does have some "noise" on her EGM, however lead thresholds send impedance have been very normal and stable over time.  I reassured her.  4. Chest pain, musculoskeletal Patient's symptoms of chest pain that started about a month ago or so while she was working in the yard and cutting rosebushes, appears to be musculoskeletal, she has easily reproducible left costochondral junction tenderness.  Also chest pain symptoms appear more pleuritic than anginal chest pain and I have reassured her.  I advised her to use conservative measures including ice packs and warm pack, could try Aleve 1 tablet a day for 5 days to see if her symptoms improve, she can certainly contact me back if her symptoms do not resolve or are not improved on NSAIDs and above measures and we can certainly consider a stress test at that point.  Otherwise stable from cardiac standpoint, I will see her back in  a year or sooner if problems.   Adrian Prows, MD, [facility] 07/08/2022, 6:50 PM Office: [phone] Fax: [phone] Pager: [phone]

## 2022-07-08 NOTE — Patient Instructions (Addendum)
Reasons for reproducible pain explained to the patient.   Heat to tender area. Cold ice compressions to reduce inflammation.   Recommended Aleve OTC 1-2 tablets BID for 3-4 days and PRN.

## 2022-07-09 DIAGNOSIS — H2511 Age-related nuclear cataract, right eye: Secondary | ICD-10-CM | POA: Diagnosis not present

## 2022-07-09 DIAGNOSIS — Z961 Presence of intraocular lens: Secondary | ICD-10-CM | POA: Diagnosis not present

## 2022-07-09 DIAGNOSIS — H401132 Primary open-angle glaucoma, bilateral, moderate stage: Secondary | ICD-10-CM | POA: Diagnosis not present

## 2022-07-09 DIAGNOSIS — Z45018 Encounter for adjustment and management of other part of cardiac pacemaker: Secondary | ICD-10-CM | POA: Diagnosis not present

## 2022-07-09 DIAGNOSIS — H04123 Dry eye syndrome of bilateral lacrimal glands: Secondary | ICD-10-CM | POA: Diagnosis not present

## 2022-07-09 DIAGNOSIS — I442 Atrioventricular block, complete: Secondary | ICD-10-CM | POA: Diagnosis not present

## 2022-07-18 ENCOUNTER — Other Ambulatory Visit: Payer: Medicare Other

## 2022-08-15 ENCOUNTER — Ambulatory Visit: Payer: Medicare Other | Admitting: Cardiology

## 2022-09-13 DIAGNOSIS — S8990XA Unspecified injury of unspecified lower leg, initial encounter: Secondary | ICD-10-CM | POA: Diagnosis not present

## 2022-09-13 DIAGNOSIS — M1711 Unilateral primary osteoarthritis, right knee: Secondary | ICD-10-CM | POA: Diagnosis not present

## 2022-09-13 DIAGNOSIS — M25561 Pain in right knee: Secondary | ICD-10-CM | POA: Diagnosis not present

## 2022-09-30 ENCOUNTER — Other Ambulatory Visit: Payer: Self-pay

## 2022-09-30 DIAGNOSIS — I10 Essential (primary) hypertension: Secondary | ICD-10-CM

## 2022-09-30 MED ORDER — LOSARTAN POTASSIUM 50 MG PO TABS: 75.0000 mg | ORAL_TABLET | Freq: Every day | ORAL | 1 refills | Status: DC

## 2022-10-07 DIAGNOSIS — E1169 Type 2 diabetes mellitus with other specified complication: Secondary | ICD-10-CM | POA: Diagnosis not present

## 2022-10-08 DIAGNOSIS — H2511 Age-related nuclear cataract, right eye: Secondary | ICD-10-CM | POA: Diagnosis not present

## 2022-10-08 DIAGNOSIS — H04123 Dry eye syndrome of bilateral lacrimal glands: Secondary | ICD-10-CM | POA: Diagnosis not present

## 2022-10-08 DIAGNOSIS — H401132 Primary open-angle glaucoma, bilateral, moderate stage: Secondary | ICD-10-CM | POA: Diagnosis not present

## 2022-10-08 DIAGNOSIS — Z961 Presence of intraocular lens: Secondary | ICD-10-CM | POA: Diagnosis not present

## 2022-10-11 DIAGNOSIS — M79671 Pain in right foot: Secondary | ICD-10-CM | POA: Diagnosis not present

## 2022-10-15 DIAGNOSIS — R0982 Postnasal drip: Secondary | ICD-10-CM | POA: Diagnosis not present

## 2022-10-15 DIAGNOSIS — J309 Allergic rhinitis, unspecified: Secondary | ICD-10-CM | POA: Diagnosis not present

## 2022-10-15 DIAGNOSIS — H9193 Unspecified hearing loss, bilateral: Secondary | ICD-10-CM | POA: Diagnosis not present

## 2022-10-15 DIAGNOSIS — R42 Dizziness and giddiness: Secondary | ICD-10-CM | POA: Diagnosis not present

## 2022-10-25 DIAGNOSIS — H2511 Age-related nuclear cataract, right eye: Secondary | ICD-10-CM | POA: Diagnosis not present

## 2022-10-25 DIAGNOSIS — H401133 Primary open-angle glaucoma, bilateral, severe stage: Secondary | ICD-10-CM | POA: Diagnosis not present

## 2022-10-25 DIAGNOSIS — H59032 Cystoid macular edema following cataract surgery, left eye: Secondary | ICD-10-CM | POA: Diagnosis not present

## 2022-11-26 DIAGNOSIS — H2511 Age-related nuclear cataract, right eye: Secondary | ICD-10-CM | POA: Diagnosis not present

## 2022-11-26 DIAGNOSIS — H35372 Puckering of macula, left eye: Secondary | ICD-10-CM | POA: Diagnosis not present

## 2022-11-26 DIAGNOSIS — H401133 Primary open-angle glaucoma, bilateral, severe stage: Secondary | ICD-10-CM | POA: Diagnosis not present

## 2022-12-06 ENCOUNTER — Ambulatory Visit: Payer: Medicare Other | Admitting: Obstetrics and Gynecology

## 2022-12-06 ENCOUNTER — Other Ambulatory Visit: Payer: Self-pay | Admitting: Obstetrics and Gynecology

## 2022-12-06 ENCOUNTER — Encounter: Payer: Self-pay | Admitting: Obstetrics and Gynecology

## 2022-12-06 VITALS — BP 131/69 | HR 79

## 2022-12-06 DIAGNOSIS — N3281 Overactive bladder: Secondary | ICD-10-CM

## 2022-12-06 DIAGNOSIS — N952 Postmenopausal atrophic vaginitis: Secondary | ICD-10-CM | POA: Diagnosis not present

## 2022-12-06 MED ORDER — GEMTESA 75 MG PO TABS
1.0000 | ORAL_TABLET | Freq: Every day | ORAL | 3 refills | Status: DC
Start: 2022-12-06 — End: 2023-05-28

## 2022-12-06 MED ORDER — ESTRADIOL 0.1 MG/GM VA CREA: TOPICAL_CREAM | VAGINAL | 11 refills | Status: DC

## 2022-12-06 NOTE — Progress Notes (Signed)
[facility] Return Visit  SUBJECTIVE  History of Present Illness: Charlene Barker is a 77 y.o. female seen in follow-up for overactive bladder. Plan at last visit was to start [facility] 75mg .   Previously tried: vesicare, toviaz, oxybutynin, myrbetriq for her symptoms.   On the gemtesa, some days are better than others. Tends to have leakage when coming into the door or running water. Overall symptoms have improved.   During the day,1-2 cup decaf coffee, water. Sometimes will drink green tea in the afternoon. Feels she has more urgency after drinking coffee.   She has a lot of irritation from wearing pads. Tried A&D without improvement.   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   Past Surgical History: She  has a past surgical history that includes Bladder surgery; loop recorder implant (N/A, 09/20/2011); permanent pacemaker insertion (N/A, 04/15/2012); and Eye surgery.   Medications: She has a current medication list which includes the following prescription(s): acetaminophen, aspirin ec, cholecalciferol, dorzolamide hcl, [START ON 12/09/2022] estradiol, latanoprost, lorazepam, losartan, meclizine, NON FORMULARY, timolol, nutritional supplements, probiotic product, and gemtesa.   Allergies: Patient is allergic to codeine, contrast media [iodinated contrast media], penicillins, sulfa drugs  cross reactors, wound dressing adhesive, chlorhexidine, latex, and povidone.   Social History: Patient  reports that she has never smoked. She has never used smokeless tobacco. She reports that she does not currently use alcohol. She reports that she does not use drugs.      OBJECTIVE     Physical Exam: Vitals:   12/06/22 1158  BP: 131/69  Pulse: 79    Gen: No apparent distress, A&O x 3.  Detailed Urogynecologic Evaluation:  Deferred. Prior exam showed: POP-Q:    POP-Q   -2.5                                            Aa   -2.5                                           Ba   -6                                              C    2                                            Gh   2.5  Pb   7.5                                            tvl    -2                                            Ap   -2                                            Bp   -7.5                                              D      ASSESSMENT AND PLAN    [name] is a 77 y.o. with:  1. Vaginal atrophy   2. Overactive bladder     - Continue with Gemtesa daily, refill provided - Again reviewed that coffee and tea can cause bladder irritation - No interested in Botox or sacral nerve stimulation at this time.  - For vaginal dryness, start estrace cream on the vulva 2-3 times a week. Can also use zinc based ointment or coconut oil daily for moisture.   Follow up 1 year or sooner if needed  Marguerita Beards, MD

## 2022-12-06 NOTE — Patient Instructions (Addendum)
Use estrogen cream 2-3 times a week at night on the vaginal tissues. Use a pea sized amount of cream.   Then during the day, use zinc based cream (like Desitin) or coconut oil for moisture.

## 2022-12-20 DIAGNOSIS — N760 Acute vaginitis: Secondary | ICD-10-CM | POA: Diagnosis not present

## 2022-12-20 DIAGNOSIS — R350 Frequency of micturition: Secondary | ICD-10-CM | POA: Diagnosis not present

## 2022-12-31 DIAGNOSIS — N760 Acute vaginitis: Secondary | ICD-10-CM | POA: Diagnosis not present

## 2023-01-03 DIAGNOSIS — H35372 Puckering of macula, left eye: Secondary | ICD-10-CM | POA: Diagnosis not present

## 2023-01-03 DIAGNOSIS — H401133 Primary open-angle glaucoma, bilateral, severe stage: Secondary | ICD-10-CM | POA: Diagnosis not present

## 2023-01-07 DIAGNOSIS — Z1231 Encounter for screening mammogram for malignant neoplasm of breast: Secondary | ICD-10-CM | POA: Diagnosis not present

## 2023-01-08 DIAGNOSIS — N760 Acute vaginitis: Secondary | ICD-10-CM | POA: Diagnosis not present

## 2023-01-10 DIAGNOSIS — Z1212 Encounter for screening for malignant neoplasm of rectum: Secondary | ICD-10-CM | POA: Diagnosis not present

## 2023-01-10 DIAGNOSIS — E875 Hyperkalemia: Secondary | ICD-10-CM | POA: Diagnosis not present

## 2023-01-10 DIAGNOSIS — I10 Essential (primary) hypertension: Secondary | ICD-10-CM | POA: Diagnosis not present

## 2023-01-10 DIAGNOSIS — E785 Hyperlipidemia, unspecified: Secondary | ICD-10-CM | POA: Diagnosis not present

## 2023-01-10 DIAGNOSIS — E559 Vitamin D deficiency, unspecified: Secondary | ICD-10-CM | POA: Diagnosis not present

## 2023-01-10 DIAGNOSIS — E1169 Type 2 diabetes mellitus with other specified complication: Secondary | ICD-10-CM | POA: Diagnosis not present

## 2023-01-17 DIAGNOSIS — I6381 Other cerebral infarction due to occlusion or stenosis of small artery: Secondary | ICD-10-CM | POA: Diagnosis not present

## 2023-01-17 DIAGNOSIS — Z0000 Encounter for general adult medical examination without abnormal findings: Secondary | ICD-10-CM | POA: Diagnosis not present

## 2023-01-17 DIAGNOSIS — E785 Hyperlipidemia, unspecified: Secondary | ICD-10-CM | POA: Diagnosis not present

## 2023-01-17 DIAGNOSIS — Z950 Presence of cardiac pacemaker: Secondary | ICD-10-CM | POA: Diagnosis not present

## 2023-01-17 DIAGNOSIS — R32 Unspecified urinary incontinence: Secondary | ICD-10-CM | POA: Diagnosis not present

## 2023-01-17 DIAGNOSIS — I10 Essential (primary) hypertension: Secondary | ICD-10-CM | POA: Diagnosis not present

## 2023-01-17 DIAGNOSIS — R82998 Other abnormal findings in urine: Secondary | ICD-10-CM | POA: Diagnosis not present

## 2023-01-17 DIAGNOSIS — F329 Major depressive disorder, single episode, unspecified: Secondary | ICD-10-CM | POA: Diagnosis not present

## 2023-01-17 DIAGNOSIS — F419 Anxiety disorder, unspecified: Secondary | ICD-10-CM | POA: Diagnosis not present

## 2023-01-17 DIAGNOSIS — R2681 Unsteadiness on feet: Secondary | ICD-10-CM | POA: Diagnosis not present

## 2023-01-17 DIAGNOSIS — G459 Transient cerebral ischemic attack, unspecified: Secondary | ICD-10-CM | POA: Diagnosis not present

## 2023-01-17 DIAGNOSIS — E1169 Type 2 diabetes mellitus with other specified complication: Secondary | ICD-10-CM | POA: Diagnosis not present

## 2023-01-17 DIAGNOSIS — R0789 Other chest pain: Secondary | ICD-10-CM | POA: Diagnosis not present

## 2023-02-03 DIAGNOSIS — R2689 Other abnormalities of gait and mobility: Secondary | ICD-10-CM | POA: Diagnosis not present

## 2023-02-11 DIAGNOSIS — R2689 Other abnormalities of gait and mobility: Secondary | ICD-10-CM | POA: Diagnosis not present

## 2023-02-18 ENCOUNTER — Encounter (HOSPITAL_BASED_OUTPATIENT_CLINIC_OR_DEPARTMENT_OTHER): Payer: Self-pay | Admitting: Emergency Medicine

## 2023-02-18 ENCOUNTER — Telehealth: Payer: Self-pay | Admitting: Cardiology

## 2023-02-18 ENCOUNTER — Emergency Department (HOSPITAL_BASED_OUTPATIENT_CLINIC_OR_DEPARTMENT_OTHER)
Admission: EM | Admit: 2023-02-18 | Discharge: 2023-02-19 | Disposition: A | Discharge status: Home / Self Care | Payer: Medicare Other | Attending: Emergency Medicine | Admitting: Emergency Medicine

## 2023-02-18 ENCOUNTER — Other Ambulatory Visit: Payer: Self-pay

## 2023-02-18 ENCOUNTER — Ambulatory Visit: Payer: Self-pay

## 2023-02-18 ENCOUNTER — Emergency Department (HOSPITAL_BASED_OUTPATIENT_CLINIC_OR_DEPARTMENT_OTHER): Payer: Medicare Other

## 2023-02-18 DIAGNOSIS — E119 Type 2 diabetes mellitus without complications: Secondary | ICD-10-CM | POA: Diagnosis not present

## 2023-02-18 DIAGNOSIS — M79602 Pain in left arm: Secondary | ICD-10-CM | POA: Diagnosis not present

## 2023-02-18 DIAGNOSIS — R0789 Other chest pain: Secondary | ICD-10-CM | POA: Insufficient documentation

## 2023-02-18 DIAGNOSIS — I10 Essential (primary) hypertension: Secondary | ICD-10-CM | POA: Diagnosis not present

## 2023-02-18 DIAGNOSIS — M25512 Pain in left shoulder: Secondary | ICD-10-CM | POA: Insufficient documentation

## 2023-02-18 DIAGNOSIS — Z91040 Latex allergy status: Secondary | ICD-10-CM | POA: Insufficient documentation

## 2023-02-18 DIAGNOSIS — D649 Anemia, unspecified: Secondary | ICD-10-CM | POA: Insufficient documentation

## 2023-02-18 DIAGNOSIS — R079 Chest pain, unspecified: Secondary | ICD-10-CM | POA: Diagnosis not present

## 2023-02-18 DIAGNOSIS — Z7982 Long term (current) use of aspirin: Secondary | ICD-10-CM | POA: Insufficient documentation

## 2023-02-18 LAB — BASIC METABOLIC PANEL
Anion gap: 13 (ref 5–15)
BUN: 13 mg/dL (ref 8–23)
CO2: 24 mmol/L (ref 22–32)
Calcium: 9.1 mg/dL (ref 8.9–10.3)
Chloride: 103 mmol/L (ref 98–111)
Creatinine, Ser: 0.57 mg/dL (ref 0.44–1.00)
GFR, Estimated: >60 mL/min (ref >60)
Glucose, Bld: 112 mg/dL — ABNORMAL HIGH (ref 70–99)
Potassium: 3.8 mmol/L (ref 3.5–5.1)
Sodium: 140 mmol/L (ref 135–145)

## 2023-02-18 LAB — TROPONIN I (HIGH SENSITIVITY)
Troponin I (High Sensitivity): 4 ng/L (ref <18)
Troponin I (High Sensitivity): 4 ng/L (ref <18)

## 2023-02-18 LAB — CBC
HCT: 27.1 % — ABNORMAL LOW (ref 36.0–46.0)
Hemoglobin: 9.1 g/dL — ABNORMAL LOW (ref 12.0–15.0)
MCH: 31.5 pg (ref 26.0–34.0)
MCHC: 33.6 g/dL (ref 30.0–36.0)
MCV: 93.8 fL (ref 80.0–100.0)
Platelets: 259 10*3/uL (ref 150–400)
RBC: 2.89 MIL/uL — ABNORMAL LOW (ref 3.87–5.11)
RDW: 13.2 % (ref 11.5–15.5)
WBC: 8.9 10*3/uL (ref 4.0–10.5)
nRBC: 0 % (ref 0.0–0.2)

## 2023-02-18 MED ORDER — ACETAMINOPHEN 500 MG PO TABS
1000.0000 mg | ORAL_TABLET | Freq: Once | ORAL | Status: AC
  Administered 2023-02-18: 1000 mg via ORAL
  Filled 2023-02-18: qty 2

## 2023-02-18 NOTE — Telephone Encounter (Signed)
Spoke with the patient who reports that Sunday and Monday she had pain in her left arm. She reports that last night it was worse and was accompanied with some slight chest pain. She states that symptoms have resolved today. She states that pain did go up her arm and into her face. She denies any numbness or tingling. She denies any dizziness, lightheadedness or shortness of breath. She took a tylenol last night and symptoms resolved and she was able to rest. She is unable to describe the pain to me. States that it was not a sharp pain nor was it pressure or heavy. She reports that it felt like if she rubbed her arm the pain would go away however it did not. Patient is frustrated that Dr. Jacinto Halim is unable to see her today even after explaining to her that he was rounding on patients in the hospital this week. She expressed multiple times that she refuses to go to [facility] ER. I have offered her an appointment with a PA tomorrow. She states that she will take the appointment but is going to go ahead an go to an urgent care today.

## 2023-02-18 NOTE — Telephone Encounter (Signed)
Pt states she is having left arm pain. Denies any other symptoms. Pt asked to be seen by someone today. Please advise.

## 2023-02-18 NOTE — Telephone Encounter (Signed)
She has no known CAD but has somatic symptoms. Can be seen in the office by NP/PA if needed.

## 2023-02-18 NOTE — Telephone Encounter (Signed)
Chief Complaint: Left arm pain  Symptoms: Left arm pain 4/10, radiating up to the left side of the face  Frequency: comes and goes  Pertinent Negatives: Patient denies chest pain, headache, blurred vision,  Disposition: [] ED /[] Urgent Care (no appt availability in office) / [x] Appointment(In office/virtual)/ []  [place] [facility]/ [] Home Care/ [] Refused Recommended Disposition /[] [place] [facility]/ []  Follow-up with PCP Additional Notes: Patient states she has left arm pain 4/10 on the pain scale that began 2-3 days. Patient did report working in the yard over the weekend that may be the cause of the pain. Patient also stated that sometimes the pain radiates up to the left side of the face and it is concerning. Care advice was given and patient stated she has an appointment scheduled to be seen tomorrow. Patient requested a ED recommendation if she needs to go tonight because she does not want to go to [facility]. Resources given for [employer], [employer]  and call 911 for assistance if unable to drive. Patient verbalized understanding.   Reason for Disposition  [1] MODERATE pain (e.g., interferes with normal activities) AND [2] present > 3 days  Answer Assessment - Initial Assessment Questions 1. ONSET: "When did the pain start?"     2 days ago 2. LOCATION: "Where is the pain located?"     Left arm  3. PAIN: "How bad is the pain?" (Scale 1-10; or mild, moderate, severe)   - MILD (1-3): Doesn't interfere with normal activities.   - MODERATE (4-7): Interferes with normal activities (e.g., work or school) or awakens from sleep.   - SEVERE (8-10): Excruciating pain, unable to do any normal activities, unable to hold a cup of water.     4/10 4. WORK OR EXERCISE: "Has there been any recent work or exercise that involved this part of the body?"     Yes, I did do some yard work over the weekend  5. CAUSE: "What do you think is causing the arm pain?"     I don't know   6. OTHER SYMPTOMS: "Do you have any other symptoms?" (e.g., neck pain, swelling, rash, fever, numbness, weakness)     Radiating up the left side of the face  Protocols used: Arm Pain-A-AH

## 2023-02-18 NOTE — ED Triage Notes (Signed)
Patient reports left arm pain onset Sunday. Referred here by her cardiologist.

## 2023-02-18 NOTE — ED Provider Notes (Signed)
EMERGENCY DEPARTMENT AT [facility] [place] Provider Note   CSN: [id number] Arrival date & time: 02/18/23  1928     History {Add pertinent medical, surgical, social history, OB history to HPI:1} Chief Complaint  Patient presents with   Arm Pain    Charlene Barker is a 77 y.o. female. Patient is poor historian but thinks that pain started in elbow and then spread throughout the arm. Had some left sided chest pain on Sunday that has since resolved. Took tylenol and arm massage for pain relief. Hx of fracture in this left shoulder. Pain worsened after blood draw today.    Arm Pain       Home Medications Prior to Admission medications   Medication Sig Start Date End Date Taking? Authorizing Provider  acetaminophen (TYLENOL) 650 MG CR tablet Take 650 mg by mouth daily as needed (for headaches, discomfort or back pain).    [provider]  aspirin EC 81 MG tablet Take 81 mg by mouth every other day.    [provider]  Cholecalciferol 50 MCG (2000 UT) CAPS Take by oral route.    [provider]  DORZOLAMIDE HCL OP Apply to eye daily.    [provider]  estradiol (ESTRACE) 0.1 MG/GM vaginal cream PLACE 0.5G NIGHTLY 2-3 TIMES A WEEK 12/06/22   Marguerita Beards, MD  latanoprost (XALATAN) 0.005 % ophthalmic solution 1 drop at bedtime.    [provider]  LORazepam (ATIVAN) 0.5 MG tablet 0.75 mg as needed. 08/03/20   [provider]  losartan (COZAAR) 50 MG tablet Take 1.5 tablets (75 mg total) by mouth daily for 90 doses. 09/30/22 12/29/22  Yates Decamp, MD  meclizine (ANTIVERT) 25 MG tablet Take 25 mg by mouth 3 (three) times daily as needed. For dizziness/vertigo    [provider]  NON FORMULARY Eye bright    [provider]  Nutritional Supplements (JUICE PLUS FIBRE PO) Take by mouth 2 (two) times daily. Patient not taking: Reported on 12/06/2022    [provider]  Probiotic Product (PROBIOTIC  ADVANCED PO) Take by mouth. Patient not taking: Reported on 12/06/2022    [provider]  timolol (BETIMOL) 0.25 % ophthalmic solution 1-2 drops 2 (two) times daily.    [provider]  Vibegron (GEMTESA) 75 MG TABS Take 1 tablet (75 mg total) by mouth daily. 12/06/22   Marguerita Beards, MD      Allergies    Codeine, Contrast media [iodinated contrast media], Penicillins, Sulfa drugs cross reactors, Wound dressing adhesive, Chlorhexidine, Latex, and Povidone    Review of Systems   Review of Systems  Physical Exam Updated Vital Signs BP (!) 149/72 (BP Location: Right Arm)   Pulse 68   Temp 97.6 F (36.4 C) (Oral)   Resp 16   Ht 5\' 4"  (1.626 m)   Wt 56.2 kg   SpO2 99%   BMI 21.28 kg/m  Physical Exam  ED Results / Procedures / Treatments   Labs (all labs ordered are listed, but only abnormal results are displayed) Labs Reviewed  BASIC METABOLIC PANEL - Abnormal; Notable for the following components:      Result Value   Glucose, Bld 112 (*)    All other components within normal limits  CBC - Abnormal; Notable for the following components:   RBC 2.89 (*)    Hemoglobin 9.1 (*)    HCT 27.1 (*)    All other components within normal limits  TROPONIN  I (HIGH SENSITIVITY)  TROPONIN I (HIGH SENSITIVITY)    EKG None  Radiology DG Chest 2 View  Result Date: 02/18/2023 CLINICAL DATA:  chest pain Patient reports left arm pain onset Sunday. Referred here by her cardiologist. Pt also stated her face feels different. EXAM: CHEST - 2 VIEW COMPARISON:  Chest x-ray 04/16/2012 FINDINGS: Dual lead left chest wall pacemaker. The heart and mediastinal contours are unchanged. No focal consolidation. No pulmonary edema. No pleural effusion. No pneumothorax. Age-indeterminate midthoracic spine mild vertebral body height loss. IMPRESSION: No active cardiopulmonary disease. Electronically Signed   By: Tish Frederickson M.D.   On: 02/18/2023 22:15    Procedures Procedures   {Document cardiac monitor, telemetry assessment procedure when appropriate:1}  Medications Ordered in ED Medications - No data to display  ED Course/ Medical Decision Making/ A&P   {   Click here for ABCD2, HEART and other calculatorsREFRESH Note before signing :1}                              Medical Decision Making Amount and/or Complexity of Data Reviewed Labs: ordered. Radiology: ordered.   ***  {Document critical care time when appropriate:1} {Document review of labs and clinical decision tools ie heart score, Chads2Vasc2 etc:1}  {Document your independent review of radiology images, and any outside records:1} {Document your discussion with family members, caretakers, and with consultants:1} {Document social determinants of health affecting pt's care:1} {Document your decision making why or why not admission, treatments were needed:1} Final Clinical Impression(s) / ED Diagnoses Final diagnoses:  None    Rx / DC Orders ED Discharge Orders     None

## 2023-02-19 ENCOUNTER — Ambulatory Visit: Payer: 59 | Admitting: Physician Assistant

## 2023-02-19 NOTE — Discharge Instructions (Addendum)
Please follow up with pcp for repeat labs in 5-7 days. Please also follow up with your cardiologist. Seek emergency care if experiencing any new or worsening symptoms

## 2023-02-21 DIAGNOSIS — M25512 Pain in left shoulder: Secondary | ICD-10-CM | POA: Diagnosis not present

## 2023-02-21 DIAGNOSIS — I10 Essential (primary) hypertension: Secondary | ICD-10-CM | POA: Diagnosis not present

## 2023-02-21 DIAGNOSIS — M79602 Pain in left arm: Secondary | ICD-10-CM | POA: Diagnosis not present

## 2023-02-21 DIAGNOSIS — Z950 Presence of cardiac pacemaker: Secondary | ICD-10-CM | POA: Diagnosis not present

## 2023-03-05 ENCOUNTER — Telehealth: Payer: Self-pay | Admitting: Cardiology

## 2023-03-05 DIAGNOSIS — I10 Essential (primary) hypertension: Secondary | ICD-10-CM

## 2023-03-05 MED ORDER — LOSARTAN POTASSIUM 50 MG PO TABS: 75.0000 mg | ORAL_TABLET | Freq: Every day | ORAL | 1 refills | Status: DC

## 2023-03-05 NOTE — Telephone Encounter (Signed)
*  STAT* If patient is at the pharmacy, call can be transferred to refill team.   1. Which medications need to be refilled? (please list name of each medication and dose if known)   losartan (COZAAR) 50 MG tablet (Expired)  75 MG   2. Which pharmacy/location (including street and city if local pharmacy) is medication to be sent to? [id number] - [place], [place] - [address]   3. Do they need a 30 day or 90 day supply? 30    Patient states that she was increased to 75 mg.

## 2023-05-07 ENCOUNTER — Telehealth: Payer: Self-pay | Admitting: Cardiology

## 2023-05-07 NOTE — Telephone Encounter (Signed)
 Dr Verl Dicker Pt she is requesting 2 RX's for losartan because of needing 75mg  total. Does Dr. Jacinto Halim want to do this? Please advise.

## 2023-05-07 NOTE — Telephone Encounter (Signed)
*  STAT* If patient is at the pharmacy, call can be transferred to refill team.   1. Which medications need to be refilled? (please list name of each medication and dose if known)   losartan  (COZAAR ) 50 MG tablet   2. Would you like to learn more about the convenience, safety, & potential cost savings by using the [facility]?   3. Are you open to using the [facility] (Type [facility]. ).  4. Which pharmacy/location (including street and city if local pharmacy) is medication to be sent to?  [id number] - [place], [place] - [address]   5. Do they need a 30 day or 90 day supply?  30 day  Patient stated she has a little medication left.  Patient wants to get two prescriptions - one for 50 mg and one for 25 mg to equal 75 mg daily dose.

## 2023-05-07 NOTE — Telephone Encounter (Signed)
I am okay with this

## 2023-05-08 ENCOUNTER — Other Ambulatory Visit (HOSPITAL_COMMUNITY): Payer: Self-pay

## 2023-05-08 ENCOUNTER — Telehealth: Payer: Self-pay | Admitting: Cardiology

## 2023-05-08 MED ORDER — LOSARTAN POTASSIUM 50 MG PO TABS
50.0000 mg | ORAL_TABLET | Freq: Every day | ORAL | 3 refills | Status: DC
  Filled 2023-05-08: qty 90, 90d supply, fill #0

## 2023-05-08 MED ORDER — LOSARTAN POTASSIUM 25 MG PO TABS: 25.0000 mg | ORAL_TABLET | Freq: Every day | ORAL | 3 refills | Status: DC

## 2023-05-08 MED ORDER — LOSARTAN POTASSIUM 25 MG PO TABS
25.0000 mg | ORAL_TABLET | Freq: Every day | ORAL | 3 refills | Status: DC
  Filled 2023-05-08: qty 90, 90d supply, fill #0

## 2023-05-08 MED ORDER — LOSARTAN POTASSIUM 50 MG PO TABS: 50.0000 mg | ORAL_TABLET | Freq: Every day | ORAL | 3 refills | Status: DC

## 2023-05-08 NOTE — Telephone Encounter (Signed)
*  STAT* If patient is at the pharmacy, call can be transferred to refill team.   1. Which medications need to be refilled? (please list name of each medication and dose if known) losartan  (COZAAR ) 25 MG tablet ; losartan  (COZAAR ) 50 MG tablet     2. Would you like to learn more about the convenience, safety, & potential cost savings by using the [facility]?     3. Are you open to using the [facility] (Type [facility]. ).   4. Which pharmacy/location (including street and city if local pharmacy) is medication to be sent to? [id number] - [place], [place] - [address]    5. Do they need a 30 day or 90 day supply? 90

## 2023-05-08 NOTE — Telephone Encounter (Signed)
 Thanks Pt's medications were sent to pt's pharmacy as requested.

## 2023-05-08 NOTE — Telephone Encounter (Signed)
 Pt's medications were sent to pt's pharmacy as requested. Confirmation received.

## 2023-05-08 NOTE — Telephone Encounter (Signed)
 Please order medication Demetris Staley, RN. Thanks

## 2023-05-08 NOTE — Telephone Encounter (Signed)
 Your sent it to Dr. Jacinto Halim and he responded back to you. Please order what Dr. Jacinto Halim stated, because it is not on the medication list. Thanks

## 2023-05-12 ENCOUNTER — Ambulatory Visit (INDEPENDENT_AMBULATORY_CARE_PROVIDER_SITE_OTHER): Payer: 59

## 2023-05-12 ENCOUNTER — Telehealth: Payer: Self-pay

## 2023-05-12 DIAGNOSIS — I442 Atrioventricular block, complete: Secondary | ICD-10-CM

## 2023-05-12 LAB — CUP PACEART REMOTE DEVICE CHECK
Battery Remaining Longevity: 13 mo
Battery Remaining Percentage: 10 %
Battery Voltage: 2.78 V
Brady Statistic AP VP Percent: 1 %
Brady Statistic AP VS Percent: 1 %
Brady Statistic AS VP Percent: 3.4 %
Brady Statistic AS VS Percent: 95 %
Brady Statistic RA Percent Paced: 1 %
Brady Statistic RV Percent Paced: 3.5 %
Date Time Interrogation Session: 20250112202712
Implantable Lead Connection Status: 753985
Implantable Lead Connection Status: 753985
Implantable Lead Implant Date: 20131218
Implantable Lead Implant Date: 20131218
Implantable Lead Location: 753859
Implantable Lead Location: 753860
Implantable Lead Model: 1944
Implantable Pulse Generator Implant Date: 20131218
Lead Channel Impedance Value: 410 Ohm
Lead Channel Impedance Value: 610 Ohm
Lead Channel Pacing Threshold Amplitude: 0.5 V
Lead Channel Pacing Threshold Amplitude: 0.5 V
Lead Channel Pacing Threshold Pulse Width: 0.5 ms
Lead Channel Pacing Threshold Pulse Width: 0.5 ms
Lead Channel Sensing Intrinsic Amplitude: 3.1 mV
Lead Channel Sensing Intrinsic Amplitude: 8.5 mV
Lead Channel Setting Pacing Amplitude: 2 V
Lead Channel Setting Pacing Amplitude: 2.5 V
Lead Channel Setting Pacing Pulse Width: 0.5 ms
Lead Channel Setting Sensing Sensitivity: 2 mV
Pulse Gen Model: 2110
Pulse Gen Serial Number: 7423192

## 2023-05-12 NOTE — Telephone Encounter (Signed)
 Pt would like a call back regarding transmission she sent 05/11/2023 because she has not been feeling good

## 2023-05-12 NOTE — Telephone Encounter (Signed)
 Patient reports of dizziness/near syncopal event yesterday afternoon while changing positions from sitting to standing. Patient reports of also experiencing dizziness while laying down last night. Pt checked BP today and today, readings below. States she is fine today if she is sitting down but when she stands dizziness comes back. Patient has some intermittent dizziness earlier today but denies at this time. Patient reports hx of balance issues and hx of vertigo.   Yesterday~ BP: 146/68 Today ~ BP: 136/62   Pacemaker transmission received and reviewed. Normal device function. 2 brief noise reversions, not a new finding.   Patient advised I will forward to gen cards since she has seen Dr. Margaretann before. Also recommend to call PCP to see what their recommendations are. Patient given ED precautions and voiced understanding.

## 2023-05-13 ENCOUNTER — Emergency Department (HOSPITAL_BASED_OUTPATIENT_CLINIC_OR_DEPARTMENT_OTHER): Payer: Medicare Other | Admitting: Radiology

## 2023-05-13 ENCOUNTER — Encounter (HOSPITAL_BASED_OUTPATIENT_CLINIC_OR_DEPARTMENT_OTHER): Payer: Self-pay | Admitting: Emergency Medicine

## 2023-05-13 ENCOUNTER — Other Ambulatory Visit: Payer: Self-pay

## 2023-05-13 ENCOUNTER — Inpatient Hospital Stay (HOSPITAL_BASED_OUTPATIENT_CLINIC_OR_DEPARTMENT_OTHER)
Admission: EM | Admit: 2023-05-13 | Discharge: 2023-05-15 | DRG: 243 | Disposition: A | Discharge status: Home / Self Care | Payer: Medicare Other | Attending: Internal Medicine | Admitting: Internal Medicine

## 2023-05-13 DIAGNOSIS — K219 Gastro-esophageal reflux disease without esophagitis: Secondary | ICD-10-CM | POA: Diagnosis not present

## 2023-05-13 DIAGNOSIS — Z1152 Encounter for screening for COVID-19: Secondary | ICD-10-CM

## 2023-05-13 DIAGNOSIS — T508X5A Adverse effect of diagnostic agents, initial encounter: Secondary | ICD-10-CM | POA: Diagnosis present

## 2023-05-13 DIAGNOSIS — M069 Rheumatoid arthritis, unspecified: Secondary | ICD-10-CM | POA: Diagnosis present

## 2023-05-13 DIAGNOSIS — Z806 Family history of leukemia: Secondary | ICD-10-CM | POA: Diagnosis not present

## 2023-05-13 DIAGNOSIS — Z823 Family history of stroke: Secondary | ICD-10-CM

## 2023-05-13 DIAGNOSIS — Z833 Family history of diabetes mellitus: Secondary | ICD-10-CM

## 2023-05-13 DIAGNOSIS — Z950 Presence of cardiac pacemaker: Secondary | ICD-10-CM | POA: Diagnosis not present

## 2023-05-13 DIAGNOSIS — E119 Type 2 diabetes mellitus without complications: Secondary | ICD-10-CM | POA: Diagnosis present

## 2023-05-13 DIAGNOSIS — Z91040 Latex allergy status: Secondary | ICD-10-CM | POA: Diagnosis not present

## 2023-05-13 DIAGNOSIS — Z885 Allergy status to narcotic agent status: Secondary | ICD-10-CM | POA: Diagnosis not present

## 2023-05-13 DIAGNOSIS — Z880 Allergy status to penicillin: Secondary | ICD-10-CM

## 2023-05-13 DIAGNOSIS — Z825 Family history of asthma and other chronic lower respiratory diseases: Secondary | ICD-10-CM

## 2023-05-13 DIAGNOSIS — Z7982 Long term (current) use of aspirin: Secondary | ICD-10-CM | POA: Diagnosis not present

## 2023-05-13 DIAGNOSIS — R42 Dizziness and giddiness: Secondary | ICD-10-CM | POA: Diagnosis not present

## 2023-05-13 DIAGNOSIS — Y712 Prosthetic and other implants, materials and accessory cardiovascular devices associated with adverse incidents: Secondary | ICD-10-CM | POA: Diagnosis present

## 2023-05-13 DIAGNOSIS — Z91041 Radiographic dye allergy status: Secondary | ICD-10-CM | POA: Diagnosis not present

## 2023-05-13 DIAGNOSIS — I10 Essential (primary) hypertension: Secondary | ICD-10-CM | POA: Diagnosis not present

## 2023-05-13 DIAGNOSIS — Z8249 Family history of ischemic heart disease and other diseases of the circulatory system: Secondary | ICD-10-CM

## 2023-05-13 DIAGNOSIS — Z45010 Encounter for checking and testing of cardiac pacemaker pulse generator [battery]: Secondary | ICD-10-CM | POA: Diagnosis not present

## 2023-05-13 DIAGNOSIS — I495 Sick sinus syndrome: Secondary | ICD-10-CM | POA: Diagnosis present

## 2023-05-13 DIAGNOSIS — R55 Syncope and collapse: Secondary | ICD-10-CM | POA: Diagnosis not present

## 2023-05-13 DIAGNOSIS — R195 Other fecal abnormalities: Secondary | ICD-10-CM | POA: Diagnosis not present

## 2023-05-13 DIAGNOSIS — R739 Hyperglycemia, unspecified: Secondary | ICD-10-CM | POA: Diagnosis not present

## 2023-05-13 DIAGNOSIS — Z882 Allergy status to sulfonamides status: Secondary | ICD-10-CM

## 2023-05-13 DIAGNOSIS — Z79899 Other long term (current) drug therapy: Secondary | ICD-10-CM

## 2023-05-13 DIAGNOSIS — T82110A Breakdown (mechanical) of cardiac electrode, initial encounter: Secondary | ICD-10-CM | POA: Diagnosis not present

## 2023-05-13 DIAGNOSIS — H409 Unspecified glaucoma: Secondary | ICD-10-CM | POA: Diagnosis not present

## 2023-05-13 DIAGNOSIS — Z888 Allergy status to other drugs, medicaments and biological substances status: Secondary | ICD-10-CM

## 2023-05-13 DIAGNOSIS — R208 Other disturbances of skin sensation: Secondary | ICD-10-CM | POA: Diagnosis present

## 2023-05-13 DIAGNOSIS — T829XXD Unspecified complication of cardiac and vascular prosthetic device, implant and graft, subsequent encounter: Secondary | ICD-10-CM

## 2023-05-13 DIAGNOSIS — Z881 Allergy status to other antibiotic agents status: Secondary | ICD-10-CM

## 2023-05-13 DIAGNOSIS — R0602 Shortness of breath: Secondary | ICD-10-CM | POA: Diagnosis not present

## 2023-05-13 DIAGNOSIS — I442 Atrioventricular block, complete: Secondary | ICD-10-CM | POA: Diagnosis not present

## 2023-05-13 LAB — CBC
HCT: 28.4 % — ABNORMAL LOW (ref 36.0–46.0)
Hemoglobin: 8.9 g/dL — ABNORMAL LOW (ref 12.0–15.0)
MCH: 24.8 pg — ABNORMAL LOW (ref 26.0–34.0)
MCHC: 31.3 g/dL (ref 30.0–36.0)
MCV: 79.1 fL — ABNORMAL LOW (ref 80.0–100.0)
Platelets: 299 10*3/uL (ref 150–400)
RBC: 3.59 MIL/uL — ABNORMAL LOW (ref 3.87–5.11)
RDW: 16.6 % — ABNORMAL HIGH (ref 11.5–15.5)
WBC: 9.6 10*3/uL (ref 4.0–10.5)
nRBC: 0 % (ref 0.0–0.2)

## 2023-05-13 LAB — COMPREHENSIVE METABOLIC PANEL
ALT: 19 U/L (ref 0–44)
AST: 24 U/L (ref 15–41)
Albumin: 4.7 g/dL (ref 3.5–5.0)
Alkaline Phosphatase: 63 U/L (ref 38–126)
Anion gap: 9 (ref 5–15)
BUN: 12 mg/dL (ref 8–23)
CO2: 24 mmol/L (ref 22–32)
Calcium: 9.5 mg/dL (ref 8.9–10.3)
Chloride: 101 mmol/L (ref 98–111)
Creatinine, Ser: 0.71 mg/dL (ref 0.44–1.00)
GFR, Estimated: >60 mL/min (ref >60)
Glucose, Bld: 209 mg/dL — ABNORMAL HIGH (ref 70–99)
Potassium: 3.6 mmol/L (ref 3.5–5.1)
Sodium: 134 mmol/L — ABNORMAL LOW (ref 135–145)
Total Bilirubin: 0.4 mg/dL (ref 0.0–1.2)
Total Protein: 7 g/dL (ref 6.5–8.1)

## 2023-05-13 LAB — OCCULT BLOOD X 1 CARD TO LAB, STOOL: Fecal Occult Bld: NEGATIVE

## 2023-05-13 LAB — BRAIN NATRIURETIC PEPTIDE: B Natriuretic Peptide: 72.5 pg/mL (ref 0.0–100.0)

## 2023-05-13 LAB — D-DIMER, QUANTITATIVE: D-Dimer, Quant: <0.27 ug{FEU}/mL (ref 0.00–0.50)

## 2023-05-13 LAB — MAGNESIUM: Magnesium: 2.1 mg/dL (ref 1.7–2.4)

## 2023-05-13 LAB — TROPONIN I (HIGH SENSITIVITY)
Troponin I (High Sensitivity): 7 ng/L (ref <18)
Troponin I (High Sensitivity): 7 ng/L (ref <18)

## 2023-05-13 NOTE — ED Triage Notes (Signed)
"  I can't breath and having passing out spells" Reports blurry vision X 1 weeks, worse on Sunday LS clear dim bases

## 2023-05-13 NOTE — ED Notes (Signed)
 Pacemaker read and confirmed that it sent by [facility]. Blue top collected and sent to lab. Assisted patient to bedside commode.

## 2023-05-13 NOTE — ED Provider Notes (Signed)
 [place] EMERGENCY DEPARTMENT AT [facility] Provider Note   CSN: [id number] Arrival date & time: 05/13/23  1427     History  Chief Complaint  Patient presents with   Shortness of Breath   Loss of Consciousness    Charlene Barker is a 78 y.o. female.  78 year old female with a history of complete heart block status post [facility] Jude pacemaker, diabetes, hypertension, and RA who presents to the emergency department with presyncope.  Patient reports that over the past 2 weeks she has been intermittently lightheaded and feels like she is going to pass out.  No room spinning sensation and says she has a history of vertigo and that this feels differently.  Over the past week has been becoming more frequent.  Today went to her husband's appointment and says that she felt her heart racing and as though she was going to pass out.  Felt very dizzy as well.  Did not fully lose consciousness.  When these occur she says that her vision feels like it is going to go out.  No recent illnesses.  Denies any chest pain during any of these events.  Not on any blood thinners.  Says that her stools have been dark recently as well.       Home Medications Prior to Admission medications   Medication Sig Start Date End Date Taking? Authorizing Provider  acetaminophen  (TYLENOL ) 650 MG CR tablet Take 650 mg by mouth daily as needed (for headaches, discomfort or back pain).    [provider]  aspirin  EC 81 MG tablet Take 81 mg by mouth every other day.    [provider]  Cholecalciferol 50 MCG (2000 UT) CAPS Take by oral route.    [provider]  DORZOLAMIDE  HCL OP Apply to eye daily.    [provider]  estradiol  (ESTRACE ) 0.1 MG/GM vaginal cream PLACE 0.5G NIGHTLY 2-3 TIMES A WEEK 12/06/22   Marilynne Rosaline SAILOR, MD  latanoprost  (XALATAN ) 0.005 % ophthalmic solution 1 drop at bedtime.    [provider]  LORazepam (ATIVAN) 0.5 MG tablet 0.75 mg as  needed. 08/03/20   [provider]  losartan  (COZAAR ) 25 MG tablet Take 1 tablet (25 mg total) by mouth daily. Take with 50 mg tablet for a total of 75 mg daily 05/08/23 05/07/24  Ladona Heinz, MD  losartan  (COZAAR ) 50 MG tablet Take 1 tablet (50 mg total) by mouth daily. Take with 25 mg tablet for a total of 75 mg daily 05/08/23 08/06/23  Ladona Heinz, MD  meclizine (ANTIVERT) 25 MG tablet Take 25 mg by mouth 3 (three) times daily as needed. For dizziness/vertigo    [provider]  NON FORMULARY Eye bright    [provider]  Nutritional Supplements (JUICE PLUS FIBRE PO) Take by mouth 2 (two) times daily. Patient not taking: Reported on 12/06/2022    [provider]  Probiotic Product (PROBIOTIC ADVANCED PO) Take by mouth. Patient not taking: Reported on 12/06/2022    [provider]  timolol  (BETIMOL ) 0.25 % ophthalmic solution 1-2 drops 2 (two) times daily.    [provider]  Vibegron  (GEMTESA ) 75 MG TABS Take 1 tablet (75 mg total) by mouth daily. 12/06/22   Marilynne Rosaline SAILOR, MD      Allergies    Codeine, Contrast media [iodinated contrast media], Penicillins, Sulfa drugs cross reactors, Wound dressing adhesive, Chlorhexidine , Latex, and Povidone    Review of Systems   Review of Systems  Physical Exam Updated Vital Signs BP (!) 153/68   Pulse 68   Temp 97.8 F (36.6 C) (Oral)   Resp 15   SpO2 99%  Physical Exam Vitals and nursing note reviewed.  Constitutional:      General: She is not in acute distress.    Appearance: She is well-developed.  HENT:     Head: Normocephalic and atraumatic.     Right Ear: External ear normal.     Left Ear: External ear normal.     Nose: Nose normal.  Eyes:     Extraocular Movements: Extraocular movements intact.     Conjunctiva/sclera: Conjunctivae normal.     Pupils: Pupils are equal, round, and reactive to light.  Cardiovascular:     Rate and Rhythm: Normal rate and regular rhythm.     Heart  sounds: No murmur heard. Pulmonary:     Effort: Pulmonary effort is normal. No respiratory distress.     Breath sounds: Normal breath sounds.  Genitourinary:    Comments: Chaperoned by patient's RN Alan. No external hemorrhoids or fissures noted on external inspection. No internal masses or hemorroids noted. Normal rectal tone. Brown stool in rectal vault. No melena or gross blood.  Musculoskeletal:     Cervical back: Normal range of motion and neck supple.     Right lower leg: No edema.     Left lower leg: No edema.  Skin:    General: Skin is warm and dry.  Neurological:     Mental Status: She is alert and oriented to person, place, and time. Mental status is at baseline.  Psychiatric:        Mood and Affect: Mood normal.     ED Results / Procedures / Treatments   Labs (all labs ordered are listed, but only abnormal results are displayed) Labs Reviewed  COMPREHENSIVE METABOLIC PANEL - Abnormal; Notable for the following components:      Result Value   Sodium 134 (*)    Glucose, Bld 209 (*)    All other components within normal limits  CBC - Abnormal; Notable for the following components:   RBC 3.59 (*)    Hemoglobin 8.9 (*)    HCT 28.4 (*)    MCV 79.1 (*)    MCH 24.8 (*)    RDW 16.6 (*)    All other components within normal limits  OCCULT BLOOD X 1 CARD TO LAB, STOOL  BRAIN NATRIURETIC PEPTIDE  D-DIMER, QUANTITATIVE  MAGNESIUM  TROPONIN I (HIGH SENSITIVITY)  TROPONIN I (HIGH SENSITIVITY)    EKG EKG Interpretation Date/Time:  Tuesday May 13 2023 14:38:23 EST Ventricular Rate:  76 PR Interval:  216 QRS Duration:  144 QT Interval:  454 QTC Calculation: 510 R Axis:   -89  Text Interpretation: Atrial-sensed ventricular-paced rhythm with prolonged AV conduction Abnormal ECG When compared with ECG of 18-Feb-2023 19:51, PREVIOUS ECG IS PRESENT Confirmed by Yolande Charleston [phone]) on 05/13/2023 3:47:43 PM  Radiology DG Chest 2 View Result Date:  05/13/2023 CLINICAL DATA:  Shortness of breath. EXAM: CHEST - 2 VIEW COMPARISON:  02/18/2023. FINDINGS: Bilateral lung fields are clear. Bilateral costophrenic angles are clear. Stable cardio-mediastinal silhouette. There is a left sided 2-lead pacemaker. No acute osseous abnormalities. The soft tissues are within normal limits. IMPRESSION: No active cardiopulmonary disease. Electronically Signed   By: Ree Molt M.D.   On: 05/13/2023 15:54    Procedures Procedures    Medications Ordered in ED Medications - No data to display  ED  Course/ Medical Decision Making/ A&P Clinical Course as of 05/13/23 2135  Tue May 13, 2023  1659 Called by [employer] representative who states that the interrogation showed that the patient has noise in the leads. Has had over 8,000 noise reversion episodes. Has had some atrial episodes going up to 187 bpm (mixed with noise) lasting less than 2 mins.  [RP]  2105 Dr Shona from hospitalist to admit. Dr Lavona from cardiology recommends reaching out to cardiology when the patient arrives to [facility] and to have EP see the patient. [RP]    Clinical Course User Index [RP] Yolande Lamar BROCKS, MD                                 Medical Decision Making Amount and/or Complexity of Data Reviewed Labs: ordered. Radiology: ordered.  Risk Decision regarding hospitalization.   TANAIRI CYPERT is a 78 y.o. female with comorbidities that complicate the patient evaluation including complete heart block status post [facility] pacemaker, diabetes, hypertension, and RA who presents to the emergency department with presyncope.    Initial Ddx:  MI, PE, arrhythmia, pacemaker malfunction, anemia  MDM/Course:  Patient presents emergency department with multiple presyncopal episodes.  Does have a history of complete heart block and has pacemaker in place.  It was interrogated which showed lots of noise with reversion.  Unclear if this is a pacemaker malfunction so was  discussed with cardiologist.  They recommended transfer to [facility] so they can be reconsulted and the patient could be discussed with electrophysiology.  She also had serial troponins, BNP, D-dimer that were all WNL.  Labs show that she is anemic but had a negative Hemoccult despite her dark stools.  EKG shows a paced rhythm without any other concerning changes.  Upon re-evaluation was stable and was admitted to the hospitalist for further management.  This patient presents to the ED for concern of complaints listed in HPI, this involves an extensive number of treatment options, and is a complaint that carries with it a high risk of complications and morbidity. Disposition including potential need for admission considered.   Dispo: Admit to Floor  Additional history obtained from spouse Records reviewed Outpatient Clinic Notes The following labs were independently interpreted: Chemistry and show no acute abnormality I independently reviewed the following imaging with scope of interpretation limited to determining acute life threatening conditions related to emergency care: Chest x-ray and agree with the radiologist interpretation with the following exceptions: none I personally reviewed and interpreted cardiac monitoring:  Ventricularly paced rhythm I personally reviewed and interpreted the pt's EKG: see above for interpretation  I have reviewed the patients home medications and made adjustments as needed Consults: Cardiology and Hospitalist Social Determinants of health:  Elderly  Portions of this note were generated with Scientist, clinical (histocompatibility and immunogenetics). Dictation errors may occur despite best attempts at proofreading.      Final Clinical Impression(s) / ED Diagnoses Final diagnoses:  Near syncope  Disorder of cardiac pacemaker system, subsequent encounter    Rx / DC Orders ED Discharge Orders     None         Yolande Lamar BROCKS, MD 05/13/23 2135

## 2023-05-13 NOTE — ED Notes (Signed)
 Spoke with [employer] pacemaker representative.

## 2023-05-13 NOTE — ED Notes (Signed)
 Sent page to [facility] MD on call for CARDIO consult

## 2023-05-14 ENCOUNTER — Observation Stay (HOSPITAL_COMMUNITY): Payer: Medicare Other

## 2023-05-14 ENCOUNTER — Other Ambulatory Visit: Payer: Self-pay

## 2023-05-14 ENCOUNTER — Inpatient Hospital Stay (HOSPITAL_COMMUNITY)
Admission: EM | Disposition: A | Discharge status: Home / Self Care | Payer: Self-pay | Source: Home / Self Care | Attending: Internal Medicine

## 2023-05-14 DIAGNOSIS — Z91041 Radiographic dye allergy status: Secondary | ICD-10-CM | POA: Diagnosis not present

## 2023-05-14 DIAGNOSIS — T508X5A Adverse effect of diagnostic agents, initial encounter: Secondary | ICD-10-CM | POA: Diagnosis present

## 2023-05-14 DIAGNOSIS — R739 Hyperglycemia, unspecified: Secondary | ICD-10-CM | POA: Diagnosis not present

## 2023-05-14 DIAGNOSIS — Z882 Allergy status to sulfonamides status: Secondary | ICD-10-CM | POA: Diagnosis not present

## 2023-05-14 DIAGNOSIS — R195 Other fecal abnormalities: Secondary | ICD-10-CM | POA: Diagnosis present

## 2023-05-14 DIAGNOSIS — T829XXD Unspecified complication of cardiac and vascular prosthetic device, implant and graft, subsequent encounter: Secondary | ICD-10-CM

## 2023-05-14 DIAGNOSIS — Z880 Allergy status to penicillin: Secondary | ICD-10-CM | POA: Diagnosis not present

## 2023-05-14 DIAGNOSIS — E119 Type 2 diabetes mellitus without complications: Secondary | ICD-10-CM | POA: Diagnosis present

## 2023-05-14 DIAGNOSIS — R208 Other disturbances of skin sensation: Secondary | ICD-10-CM | POA: Diagnosis present

## 2023-05-14 DIAGNOSIS — T82110A Breakdown (mechanical) of cardiac electrode, initial encounter: Secondary | ICD-10-CM

## 2023-05-14 DIAGNOSIS — Y712 Prosthetic and other implants, materials and accessory cardiovascular devices associated with adverse incidents: Secondary | ICD-10-CM | POA: Diagnosis present

## 2023-05-14 DIAGNOSIS — Z885 Allergy status to narcotic agent status: Secondary | ICD-10-CM | POA: Diagnosis not present

## 2023-05-14 DIAGNOSIS — M069 Rheumatoid arthritis, unspecified: Secondary | ICD-10-CM | POA: Diagnosis present

## 2023-05-14 DIAGNOSIS — R55 Syncope and collapse: Secondary | ICD-10-CM

## 2023-05-14 DIAGNOSIS — I10 Essential (primary) hypertension: Secondary | ICD-10-CM | POA: Diagnosis present

## 2023-05-14 DIAGNOSIS — I495 Sick sinus syndrome: Secondary | ICD-10-CM | POA: Diagnosis present

## 2023-05-14 DIAGNOSIS — Z1152 Encounter for screening for COVID-19: Secondary | ICD-10-CM | POA: Diagnosis not present

## 2023-05-14 DIAGNOSIS — I442 Atrioventricular block, complete: Secondary | ICD-10-CM

## 2023-05-14 DIAGNOSIS — Z45010 Encounter for checking and testing of cardiac pacemaker pulse generator [battery]: Secondary | ICD-10-CM | POA: Diagnosis not present

## 2023-05-14 DIAGNOSIS — R42 Dizziness and giddiness: Secondary | ICD-10-CM

## 2023-05-14 DIAGNOSIS — Z7982 Long term (current) use of aspirin: Secondary | ICD-10-CM | POA: Diagnosis not present

## 2023-05-14 DIAGNOSIS — Z79899 Other long term (current) drug therapy: Secondary | ICD-10-CM | POA: Diagnosis not present

## 2023-05-14 DIAGNOSIS — Z806 Family history of leukemia: Secondary | ICD-10-CM | POA: Diagnosis not present

## 2023-05-14 DIAGNOSIS — H409 Unspecified glaucoma: Secondary | ICD-10-CM | POA: Diagnosis present

## 2023-05-14 DIAGNOSIS — Z823 Family history of stroke: Secondary | ICD-10-CM | POA: Diagnosis not present

## 2023-05-14 DIAGNOSIS — Z833 Family history of diabetes mellitus: Secondary | ICD-10-CM | POA: Diagnosis not present

## 2023-05-14 DIAGNOSIS — Z950 Presence of cardiac pacemaker: Secondary | ICD-10-CM

## 2023-05-14 DIAGNOSIS — Z825 Family history of asthma and other chronic lower respiratory diseases: Secondary | ICD-10-CM | POA: Diagnosis not present

## 2023-05-14 DIAGNOSIS — Z8249 Family history of ischemic heart disease and other diseases of the circulatory system: Secondary | ICD-10-CM | POA: Diagnosis not present

## 2023-05-14 DIAGNOSIS — K219 Gastro-esophageal reflux disease without esophagitis: Secondary | ICD-10-CM | POA: Diagnosis present

## 2023-05-14 DIAGNOSIS — Z91040 Latex allergy status: Secondary | ICD-10-CM | POA: Diagnosis not present

## 2023-05-14 HISTORY — PX: LEAD REVISION/REPAIR: EP1213

## 2023-05-14 HISTORY — PX: PPM GENERATOR CHANGEOUT: EP1233

## 2023-05-14 LAB — BASIC METABOLIC PANEL
Anion gap: 8 (ref 5–15)
BUN: 8 mg/dL (ref 8–23)
CO2: 24 mmol/L (ref 22–32)
Calcium: 9.4 mg/dL (ref 8.9–10.3)
Chloride: 107 mmol/L (ref 98–111)
Creatinine, Ser: 0.71 mg/dL (ref 0.44–1.00)
GFR, Estimated: >60 mL/min (ref >60)
Glucose, Bld: 119 mg/dL — ABNORMAL HIGH (ref 70–99)
Potassium: 3.9 mmol/L (ref 3.5–5.1)
Sodium: 139 mmol/L (ref 135–145)

## 2023-05-14 LAB — ECHOCARDIOGRAM COMPLETE
Area-P 1/2: 2.69 cm2
Height: 64 in
S' Lateral: 2.1 cm
Weight: 1968 [oz_av]

## 2023-05-14 LAB — GLUCOSE, CAPILLARY
Glucose-Capillary: 121 mg/dL — ABNORMAL HIGH (ref 70–99)
Glucose-Capillary: 121 mg/dL — ABNORMAL HIGH (ref 70–99)

## 2023-05-14 LAB — RESP PANEL BY RT-PCR (RSV, FLU A&B, COVID)  RVPGX2
Influenza A by PCR: NEGATIVE
Influenza B by PCR: NEGATIVE
Resp Syncytial Virus by PCR: NEGATIVE
SARS Coronavirus 2 by RT PCR: NEGATIVE

## 2023-05-14 LAB — CBC WITH DIFFERENTIAL/PLATELET
Abs Immature Granulocytes: 0.03 10*3/uL (ref 0.00–0.07)
Basophils Absolute: 0.1 10*3/uL (ref 0.0–0.1)
Basophils Relative: 1 %
Eosinophils Absolute: 0.3 10*3/uL (ref 0.0–0.5)
Eosinophils Relative: 3 %
HCT: 27.6 % — ABNORMAL LOW (ref 36.0–46.0)
Hemoglobin: 8.7 g/dL — ABNORMAL LOW (ref 12.0–15.0)
Immature Granulocytes: 0 %
Lymphocytes Relative: 39 %
Lymphs Abs: 3.3 10*3/uL (ref 0.7–4.0)
MCH: 25.1 pg — ABNORMAL LOW (ref 26.0–34.0)
MCHC: 31.5 g/dL (ref 30.0–36.0)
MCV: 79.5 fL — ABNORMAL LOW (ref 80.0–100.0)
Monocytes Absolute: 0.9 10*3/uL (ref 0.1–1.0)
Monocytes Relative: 11 %
Neutro Abs: 3.9 10*3/uL (ref 1.7–7.7)
Neutrophils Relative %: 46 %
Platelets: 262 10*3/uL (ref 150–400)
RBC: 3.47 MIL/uL — ABNORMAL LOW (ref 3.87–5.11)
RDW: 16.5 % — ABNORMAL HIGH (ref 11.5–15.5)
WBC: 8.4 10*3/uL (ref 4.0–10.5)
nRBC: 0 % (ref 0.0–0.2)

## 2023-05-14 LAB — MRSA NEXT GEN BY PCR, NASAL: MRSA by PCR Next Gen: NOT DETECTED

## 2023-05-14 LAB — HEMOGLOBIN A1C
Hgb A1c MFr Bld: 5.9 % — ABNORMAL HIGH (ref 4.8–5.6)
Mean Plasma Glucose: 122.63 mg/dL

## 2023-05-14 SURGERY — LEAD REVISION/REPAIR

## 2023-05-14 MED ORDER — LIDOCAINE HCL (PF) 1 % IJ SOLN
INTRAMUSCULAR | Status: DC | PRN
  Administered 2023-05-14: 60 mL

## 2023-05-14 MED ORDER — SODIUM CHLORIDE 0.9 % IV SOLN
80.0000 mg | INTRAVENOUS | Status: AC
  Administered 2023-05-14: 80 mg
  Filled 2023-05-14: qty 2

## 2023-05-14 MED ORDER — VANCOMYCIN HCL IN DEXTROSE 1-5 GM/200ML-% IV SOLN
1000.0000 mg | INTRAVENOUS | Status: DC
  Filled 2023-05-14: qty 200

## 2023-05-14 MED ORDER — VANCOMYCIN HCL 10 G IV SOLR
INTRAVENOUS | Status: DC | PRN
  Administered 2023-05-14: 1000 mg via INTRAVENOUS

## 2023-05-14 MED ORDER — INSULIN ASPART 100 UNIT/ML IJ SOLN
0.0000 [IU] | Freq: Three times a day (TID) | INTRAMUSCULAR | Status: DC
Start: 2023-05-14 — End: 2023-05-14

## 2023-05-14 MED ORDER — MIDAZOLAM HCL 5 MG/5ML IJ SOLN
INTRAMUSCULAR | Status: AC
  Filled 2023-05-14: qty 5

## 2023-05-14 MED ORDER — DIPHENHYDRAMINE HCL 50 MG/ML IJ SOLN
INTRAMUSCULAR | Status: AC
  Administered 2023-05-14: 25 mg via INTRAVENOUS
  Filled 2023-05-14: qty 1

## 2023-05-14 MED ORDER — LATANOPROST 0.005 % OP SOLN
1.0000 [drp] | Freq: Every day | OPHTHALMIC | Status: DC
  Administered 2023-05-14: 1 [drp] via OPHTHALMIC
  Filled 2023-05-14: qty 2.5

## 2023-05-14 MED ORDER — FENTANYL CITRATE (PF) 100 MCG/2ML IJ SOLN
INTRAMUSCULAR | Status: DC | PRN
  Administered 2023-05-14: 25 ug via INTRAVENOUS

## 2023-05-14 MED ORDER — DORZOLAMIDE HCL 2 % OP SOLN
1.0000 [drp] | Freq: Two times a day (BID) | OPHTHALMIC | Status: DC
  Administered 2023-05-14 – 2023-05-15 (×3): 1 [drp] via OPHTHALMIC
  Filled 2023-05-14: qty 10

## 2023-05-14 MED ORDER — SODIUM CHLORIDE 0.9 % IV SOLN
INTRAVENOUS | Status: AC
  Filled 2023-05-14: qty 2

## 2023-05-14 MED ORDER — METHYLPREDNISOLONE SODIUM SUCC 125 MG IJ SOLR
INTRAMUSCULAR | Status: AC
  Administered 2023-05-14: 125 mg via INTRAVENOUS
  Filled 2023-05-14: qty 2

## 2023-05-14 MED ORDER — DOCUSATE SODIUM 100 MG PO CAPS
100.0000 mg | ORAL_CAPSULE | Freq: Every day | ORAL | Status: DC
  Administered 2023-05-14 – 2023-05-15 (×2): 100 mg via ORAL
  Filled 2023-05-14 (×2): qty 1

## 2023-05-14 MED ORDER — ENOXAPARIN SODIUM 40 MG/0.4ML IJ SOSY
40.0000 mg | PREFILLED_SYRINGE | Freq: Every day | INTRAMUSCULAR | Status: DC
  Filled 2023-05-14 (×2): qty 0.4

## 2023-05-14 MED ORDER — VANCOMYCIN HCL IN DEXTROSE 1-5 GM/200ML-% IV SOLN
INTRAVENOUS | Status: AC
  Filled 2023-05-14: qty 200

## 2023-05-14 MED ORDER — SENNOSIDES-DOCUSATE SODIUM 8.6-50 MG PO TABS: 1.0000 | ORAL_TABLET | Freq: Every evening | ORAL | Status: DC | PRN

## 2023-05-14 MED ORDER — INSULIN ASPART 100 UNIT/ML IJ SOLN
0.0000 [IU] | Freq: Every day | INTRAMUSCULAR | Status: DC
Start: 2023-05-14 — End: 2023-05-14

## 2023-05-14 MED ORDER — LOSARTAN POTASSIUM 50 MG PO TABS
75.0000 mg | ORAL_TABLET | Freq: Every day | ORAL | Status: DC
  Administered 2023-05-14 – 2023-05-15 (×2): 75 mg via ORAL
  Filled 2023-05-14 (×2): qty 1

## 2023-05-14 MED ORDER — ASPIRIN 81 MG PO TBEC
81.0000 mg | DELAYED_RELEASE_TABLET | ORAL | Status: DC
  Administered 2023-05-14: 81 mg via ORAL
  Filled 2023-05-14: qty 1

## 2023-05-14 MED ORDER — HEPARIN (PORCINE) IN NACL 1000-0.9 UT/500ML-% IV SOLN
INTRAVENOUS | Status: DC | PRN
  Administered 2023-05-14: 500 mL

## 2023-05-14 MED ORDER — FENTANYL CITRATE (PF) 100 MCG/2ML IJ SOLN
INTRAMUSCULAR | Status: AC
  Filled 2023-05-14: qty 2

## 2023-05-14 MED ORDER — DIPHENHYDRAMINE HCL 50 MG/ML IJ SOLN: 25.0000 mg | Freq: Once | INTRAMUSCULAR | Status: AC

## 2023-05-14 MED ORDER — METHYLPREDNISOLONE SODIUM SUCC 125 MG IJ SOLR: 125.0000 mg | Freq: Once | INTRAMUSCULAR | Status: AC

## 2023-05-14 MED ORDER — ONDANSETRON HCL 4 MG/2ML IJ SOLN
4.0000 mg | Freq: Four times a day (QID) | INTRAMUSCULAR | Status: DC | PRN
Start: 2023-05-14 — End: 2023-05-15

## 2023-05-14 MED ORDER — ORAL CARE MOUTH RINSE: 15.0000 mL | OROMUCOSAL | Status: DC | PRN

## 2023-05-14 MED ORDER — MIDAZOLAM HCL 5 MG/5ML IJ SOLN
INTRAMUSCULAR | Status: DC | PRN
  Administered 2023-05-14: 1 mg via INTRAVENOUS

## 2023-05-14 MED ORDER — B COMPLEX-C PO TABS
1.0000 | ORAL_TABLET | Freq: Every day | ORAL | Status: DC
  Administered 2023-05-14 – 2023-05-15 (×2): 1 via ORAL
  Filled 2023-05-14 (×2): qty 1

## 2023-05-14 MED ORDER — SODIUM CHLORIDE 0.9 % IV SOLN: INTRAVENOUS | Status: DC

## 2023-05-14 MED ORDER — ACETAMINOPHEN 650 MG RE SUPP: 650.0000 mg | Freq: Four times a day (QID) | RECTAL | Status: DC | PRN

## 2023-05-14 MED ORDER — LIDOCAINE HCL 1 % IJ SOLN
INTRAMUSCULAR | Status: AC
  Filled 2023-05-14: qty 60

## 2023-05-14 MED ORDER — ACETAMINOPHEN 325 MG PO TABS
650.0000 mg | ORAL_TABLET | Freq: Four times a day (QID) | ORAL | Status: DC | PRN
  Administered 2023-05-14 – 2023-05-15 (×2): 650 mg via ORAL
  Filled 2023-05-14 (×2): qty 2

## 2023-05-14 MED ORDER — MIRABEGRON ER 25 MG PO TB24
25.0000 mg | ORAL_TABLET | Freq: Every day | ORAL | Status: DC
  Administered 2023-05-15: 25 mg via ORAL
  Filled 2023-05-14 (×2): qty 1

## 2023-05-14 MED ORDER — IODIXANOL 320 MG/ML IV SOLN
INTRAVENOUS | Status: DC | PRN
  Administered 2023-05-14: 10 mL

## 2023-05-14 MED ORDER — TIMOLOL MALEATE 0.5 % OP SOLN
1.0000 [drp] | Freq: Every day | OPHTHALMIC | Status: DC
  Administered 2023-05-14 – 2023-05-15 (×2): 1 [drp] via OPHTHALMIC
  Filled 2023-05-14: qty 5

## 2023-05-14 SURGICAL SUPPLY — 14 items
CABLE SURGICAL S-101-97-12 (CABLE) ×1 IMPLANT
CATH CPS LOCATOR 3D MED (CATHETERS) IMPLANT
DEVICE DISSECT PLASMABLAD 3.0S (MISCELLANEOUS) IMPLANT
HELIX LOCKING TOOL (MISCELLANEOUS) ×1
KIT MICROPUNCTURE NIT STIFF (SHEATH) IMPLANT
LEAD ULTIPACE 65 LPA1231/65 (Lead) IMPLANT
PACEMAKER ASSURITY DR-RF (Pacemaker) IMPLANT
PAD DEFIB RADIO PHYSIO CONN (PAD) ×1 IMPLANT
PLASMABLADE 3.0S (MISCELLANEOUS) ×1
SHEATH 9FR PRELUDE SNAP 13 (SHEATH) IMPLANT
SLITTER AGILIS HISPRO (INSTRUMENTS) IMPLANT
TOOL HELIX LOCKING (MISCELLANEOUS) IMPLANT
TRAY PACEMAKER INSERTION (PACKS) ×1 IMPLANT
WIRE HI TORQ VERSACORE-J 145CM (WIRE) IMPLANT

## 2023-05-14 NOTE — Plan of Care (Signed)

## 2023-05-14 NOTE — Progress Notes (Signed)
 Patient returned to unit room from cath lab.  Patient A&Ox4, room air, VS stable.  Dinner ordered and L PIV removed per protocol.  R PIV started, saline locked.

## 2023-05-14 NOTE — Plan of Care (Signed)
  Problem: Education: Goal: Knowledge of General Education information will improve Description: Including pain rating scale, medication(s)/side effects and non-pharmacologic comfort measures Outcome: Progressing   Problem: Health Behavior/Discharge Planning: Goal: Ability to manage health-related needs will improve Outcome: Progressing   Problem: Clinical Measurements: Goal: Ability to maintain clinical measurements within normal limits will improve Outcome: Progressing Goal: Will remain free from infection Outcome: Progressing Goal: Diagnostic test results will improve Outcome: Progressing Goal: Respiratory complications will improve Outcome: Progressing Goal: Cardiovascular complication will be avoided Outcome: Progressing   Problem: Activity: Goal: Risk for activity intolerance will decrease Outcome: Progressing   Problem: Nutrition: Goal: Adequate nutrition will be maintained Outcome: Progressing   Problem: Coping: Goal: Level of anxiety will decrease Outcome: Progressing   Problem: Elimination: Goal: Will not experience complications related to bowel motility Outcome: Progressing Goal: Will not experience complications related to urinary retention Outcome: Progressing   Problem: Pain Management: Goal: General experience of comfort will improve Outcome: Progressing   Problem: Safety: Goal: Ability to remain free from injury will improve Outcome: Progressing   Problem: Skin Integrity: Goal: Risk for impaired skin integrity will decrease Outcome: Progressing   Problem: Education: Goal: Ability to describe self-care measures that may prevent or decrease complications (Diabetes Survival Skills Education) will improve Outcome: Progressing Goal: Individualized Educational Video(s) Outcome: Progressing   Problem: Coping: Goal: Ability to adjust to condition or change in health will improve Outcome: Progressing   Problem: Fluid Volume: Goal: Ability to  maintain a balanced intake and output will improve Outcome: Progressing   Problem: Health Behavior/Discharge Planning: Goal: Ability to identify and utilize available resources and services will improve Outcome: Progressing Goal: Ability to manage health-related needs will improve Outcome: Progressing   Problem: Metabolic: Goal: Ability to maintain appropriate glucose levels will improve Outcome: Progressing   Problem: Nutritional: Goal: Maintenance of adequate nutrition will improve Outcome: Progressing Goal: Progress toward achieving an optimal weight will improve Outcome: Progressing   Problem: Skin Integrity: Goal: Risk for impaired skin integrity will decrease Outcome: Progressing   Problem: Tissue Perfusion: Goal: Adequacy of tissue perfusion will improve Outcome: Progressing   Problem: Activity: Goal: Ability to tolerate increased activity will improve Outcome: Progressing   Problem: Clinical Measurements: Goal: Ability to maintain a body temperature in the normal range will improve Outcome: Progressing   Problem: Respiratory: Goal: Ability to maintain adequate ventilation will improve Outcome: Progressing Goal: Ability to maintain a clear airway will improve Outcome: Progressing   Problem: Education: Goal: Knowledge of cardiac device and self-care will improve Outcome: Progressing Goal: Ability to safely manage health related needs after discharge will improve Outcome: Progressing Goal: Individualized Educational Video(s) Outcome: Progressing   Problem: Cardiac: Goal: Ability to achieve and maintain adequate cardiopulmonary perfusion will improve Outcome: Progressing

## 2023-05-14 NOTE — Consult Note (Addendum)
 ELECTROPHYSIOLOGY CONSULT NOTE    Patient ID: Charlene Barker MRN: [id number], DOB/AGE: 1946/04/10 78 y.o.  Admit date: 05/13/2023 Date of Consult: 05/14/2023  Primary Physician: Aldo Hun, MD Primary Cardiologist: Dr. Berry Bristol Electrophysiologist: Dr. Rodolfo Clan   Referring Provider: Dr. Thelma Fire  Patient Profile: Charlene Barker is a 78 y.o. female with a history of CHB s/p PPM 2013, HTN, DM2, RA, and BPPV who is being seen today for the evaluation of PPM malfunction at the request of Dr. Thelma Fire.  HPI:  Charlene Barker is a 78 y.o. female with medical history as above.   Device implanted 2013 after syncope -> LBBB -> Loop recorder with intermittent CHB. Known atrial lead oversensing.   In 2021 she fell in her driveway and started to have VENTRICULAR oversensing/noise/EMI. She was asymptomatic and watchful waiting was pursued.   On Sunday, pt developed intermittent dizziness. Has had symptoms on and off rarely, but more persistent over the past couple of weeks. Had another episode on Monday and severe episode Tuesday with pre-syncope and presented to [facility] ED.   Suprisingly, remote interrogation 05/12/2023 had shown noise, but did NOT demonstrate dependence with V pacing only ~ 5%  Today, she is dependent down to 35, and when tested symptoms are similar to the dizziness she has been having. Noise is reproducible with isometric movements, and at time general arm movements.  CXR without gross lead changes.   Labs Potassium3.6 (01/14 1445) Magnesium  2.1 (01/14 1455) Creatinine, ser  0.71 (01/14 1445) PLT  299 (01/14 1445) HGB  8.9* (01/14 1445) WBC 9.6 (01/14 1445) Troponin I (High Sensitivity)7 (01/14 1853).    Allergies, Medical, Surgical, Social, and Family Histories have been reviewed and are referenced here-in when relevant for medical decision making.     Physical Exam: Vitals:   05/14/23 0338 05/14/23 0405 05/14/23 0416 05/14/23 0733  BP: (!) 126/55   (!) 160/60  Pulse:  71   73  Resp: 16     Temp: 97.6 F (36.4 C)   98.7 F (37.1 C)  TempSrc: Oral   Oral  SpO2:  97% 96% 97%  Weight: 55.8 kg     Height: 5\' 4"  (1.626 m)       GEN- NAD, A&O x 3, normal affect HEENT: Normocephalic, atraumatic Lungs- CTAB, Normal effort.  Heart- Regular rate and rhythm, No M/G/R.  GI- Soft, NT, ND.  Extremities- No clubbing, cyanosis, or edema   Radiology/Studies: CT HEAD WO CONTRAST ( ) Result Date: 05/14/2023 CLINICAL DATA:  Vertigo with central dizziness. EXAM: CT HEAD WITHOUT CONTRAST TECHNIQUE: Contiguous axial images were obtained from the base of the skull through the vertex without intravenous contrast. RADIATION DOSE REDUCTION: This exam was performed according to the departmental dose-optimization program which includes automated exposure control, adjustment of the mA and/or kV according to patient size and/or use of iterative reconstruction technique. COMPARISON:  07/16/2016 FINDINGS: Brain: No evidence of acute infarction, hemorrhage, hydrocephalus, extra-axial collection or mass lesion/mass effect. 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 Age normal brain volume. Vascular: No hyperdense vessel or unexpected calcification. Skull: Normal. Negative for fracture or focal lesion. Sinuses/Orbits: Bilateral glaucoma reservoir implant. IMPRESSION: No acute finding or change from 2018. Electronically Signed   By: Ronnette Coke M.D.   On: 05/14/2023 05:34   DG Chest 2 View Result Date: 05/13/2023 CLINICAL DATA:  Shortness of breath. EXAM: CHEST - 2 VIEW COMPARISON:  02/18/2023. FINDINGS: Bilateral lung fields are clear. Bilateral costophrenic angles are  clear. Stable cardio-mediastinal silhouette. There is a left sided 2-lead pacemaker. No acute osseous abnormalities. The soft tissues are within normal limits. IMPRESSION: No active cardiopulmonary disease. Electronically Signed   By: Beula Brunswick M.D.   On: 05/13/2023 15:54   CUP PACEART REMOTE  DEVICE CHECK Result Date: 05/12/2023 Scheduled remote reviewed. Normal device function.  Numerous short AMS episodes, EGMs show RA lead noise which is not a new finding.  Episode list shows AMS episode lasting 30 minutes on 02/26/23, no EGM available for review. Known noise reversion episodes show noise on RA and RV leads. Next remote 91 days. [place], CVRS   EKG:on arrival showed AS-VP at 76 bpm (personally reviewed)  TELEMETRY: NSR 60-70s with intermittent pacing inhibition (personally reviewed)  DEVICE HISTORY: [employer] DDD PPM implanted 2013 for intermittent CHB with syncope  Assessment/Plan:  Pacemaker Malfunction Atrial and Ventricular lead noise with oversensing Intermittent CHB Today she is in CHB with inhibition of pacing that is reproducible.  RV sensitivity changed to 12.5 mV in attempt to limit oversensing of noise.  Pt will require new lead. Discussed options of abandoning her current lead and placing a new lead vs extraction.  Poor candidate for extraction given advanced age of pt and leads (2013), though will need to consider if her vein is occluded.   Will make NPO for now until pt seen by MD.   Will use Betadine  scrub with allergy to Chlorhexidine  No apparent cross reaction re: alert for Sulfur Dioxide and Gentamicin  per Clinical Pharmacy.   For questions or updates, please contact [facility] Please consult [email or web address] for contact info under Cardiology/STEMI.  Delorise Few, PA-C  05/14/2023 8:49 AM

## 2023-05-14 NOTE — Progress Notes (Signed)
 Echocardiogram 2D Echocardiogram has been performed.  Emmaline Haring Caileigh Canche RDCS 05/14/2023, 10:53 AM

## 2023-05-14 NOTE — Progress Notes (Signed)
 [facility]                                                                              Charlene Barker, is a 78 y.o. female, DOB - 1946-04-23, ZOX:[id number] Admit date - 05/13/2023    Outpatient Primary MD for the patient is Charlene Hun, MD  LOS - 0  days  Chief Complaint  Patient presents with   Shortness of Breath   Loss of Consciousness       Brief summary   Patient is a 78 year old female with CHB status post pacemaker in 2013, HTN, type II DM, rheumatoid arthritis, vertigo presented with persistent dizziness.  Per patient, on Sunday she developed persistent dizziness however she has been having the dizziness off and on for the last 2 weeks.  Denied any headache, palpitation or chest pain.  No new medications or change in medications. Remote interrogation 05/12/2023 had shown noise, but did NOT demonstrate dependence with V pacing only ~ 5%  Patient was admitted for further workup, EP cardiology consulted.  Assessment & Plan    Principal Problem: Persistent dizziness, near syncope -In the setting of CHB, pacemaker -EP cardiology consulted, await further recommendations.   -D-dimer normal -2D echo showed EF of 65 to 70%, G1 DD, normal right ventricular systolic function  Active Problems: History of complete heart block status post pacemaker--St Jude Accent DR 2110 dual chamber pacemaker12/18/2013 -Management per EP cardiology  Diabetes mellitus type 2 -Hemoglobin A1c 5.9 -Diet controlled, patient is not on any oral hypoglycemics outpatient and declined to be on sliding scale insulin while inpatient -SSI discontinued   Hypertension - continue losartan    Estimated body mass index is 21.11 kg/m as calculated from the following:   Height as of this encounter: 5\' 4" (1.626 m).   Weight as of this encounter: 55.8 kg.  Code Status: full  DVT Prophylaxis:     Level of Care: Level of care: Telemetry Cardiac Family Communication: Updated  patient Disposition Plan:      Remains inpatient appropriate:      Procedures:    Consultants:   EP cardiology   Antimicrobials:   Anti-infectives (From admission, onward)    Start     Dose/Rate Route Frequency Ordered Stop   05/14/23 1345  gentamicin (GARAMYCIN) 80 mg in sodium chloride 0.9 % 500 mL irrigation        80  mg Irrigation On call 05/14/23 1300 05/15/23 1345   05/14/23 1345  vancomycin  (VANCOCIN ) IVPB 1000 mg/200 mL premix        1,000 mg 200 mL/hr over 60 Minutes Intravenous On call 05/14/23 1300 05/15/23 1345          Medications  B-complex with vitamin C  1 tablet Oral Daily   docusate sodium   100 mg Oral Daily   dorzolamide   1 drop Both Eyes BID   gentamicin  (GARAMYCIN ) 80 mg in sodium chloride  0.9 % 500 mL irrigation  80 mg Irrigation On Call   latanoprost   1 drop Both Eyes QHS   losartan   75 mg Oral Daily   mirabegron  ER  25 mg Oral Daily   timolol   1 drop Both Eyes Daily      Subjective:   Charlene Barker was seen and examined today.  Still dizzy. No acute complaints. No vertigo.  Patient denies chest pain, shortness of breath, abdominal pain, N/V/D/C. No acute events overnight.    Objective:   Vitals:   05/14/23 0405 05/14/23 0416 05/14/23 0733 05/14/23 1109  BP:   (!) 160/60 (!) 140/45  Pulse:   73 71  Resp:      Temp:   98.7 F (37.1 C) 98.4 F (36.9 C)  TempSrc:   Oral Oral  SpO2: 97% 96% 97%   Weight:      Height:        Intake/Output Summary (Last 24 hours) at 05/14/2023 1454 Last data filed at 05/14/2023 0847 Gross per 24 hour  Intake 120 ml  Output 300 ml  Net -180 ml     Wt Readings from Last 3 Encounters:  05/14/23 55.8 kg  02/18/23 56.2 kg  07/08/22 56.7 kg     Exam General: Alert and oriented x 3, NAD Cardiovascular: S1 S2 auscultated,  RRR, pacer + Respiratory: Clear to auscultation bilaterally, no wheezing Gastrointestinal: Soft, nontender, nondistended, + bowel sounds Ext: no pedal edema bilaterally Neuro:  Strength 5/5 upper and lower extremities bilaterally Psych: Normal affect     Data Reviewed:  I have personally reviewed following labs    CBC Lab Results  Component Value Date   WBC 8.4 05/14/2023   RBC 3.47 (L) 05/14/2023   HGB 8.7 (L) 05/14/2023   HCT 27.6 (L) 05/14/2023   MCV 79.5 (L) 05/14/2023   MCH 25.1 (L) 05/14/2023   PLT 262 05/14/2023   MCHC 31.5 05/14/2023   RDW 16.5 (H) 05/14/2023   LYMPHSABS 3.3 05/14/2023   MONOABS 0.9 05/14/2023   EOSABS 0.3 05/14/2023   BASOSABS 0.1 05/14/2023     Last metabolic panel Lab Results  Component Value Date   NA 139 05/14/2023   K 3.9 05/14/2023   CL 107 05/14/2023   CO2 24 05/14/2023   BUN 8 05/14/2023   CREATININE 0.71 05/14/2023   GLUCOSE 119 (H) 05/14/2023   GFRNONAA >60 05/14/2023   GFRAA >60 07/16/2016   CALCIUM 9.4 05/14/2023   PROT 7.0 05/13/2023   ALBUMIN 4.7 05/13/2023   LABGLOB 2.4 03/02/2015   AGRATIO 2.0 02/15/2015   BILITOT 0.4 05/13/2023   ALKPHOS 63 05/13/2023   AST 24 05/13/2023   ALT 19 05/13/2023   ANIONGAP 8 05/14/2023    CBG (last 3)  Recent Labs    05/14/23 0810 05/14/23 1212  GLUCAP 121* 121*      Coagulation Profile: No results for input(s): "INR", "PROTIME" in the last 168 hours.   Radiology Studies: I have personally reviewed the imaging studies  ECHOCARDIOGRAM COMPLETE Result Date: 05/14/2023    ECHOCARDIOGRAM REPORT   Patient Name:   Charlene Barker Date of Exam: 05/14/2023 Medical Rec #:  [id number]       Height:       64.0 in Accession #:    [id number]      Weight:       123.0 lb Date of Birth:  05/23/1945      BSA:          1.591 m Patient Age:    77 years        BP:           160/60 mmHg Patient Gender: F  HR:           71 bpm. Exam Location:  Inpatient Procedure: 2D Echo, Color Doppler and Cardiac Doppler Indications:    I44.2 Complete heart block  History:        Patient has prior history of Echocardiogram examinations, most                 recent 08/26/2020.  Pacemaker, Arrythmias:LBBB; Risk                 Factors:Hypertension and Diabetes.  Sonographer:    Sherline Distel Senior RDCS Referring Phys: [id number] Dellia Ferguson TILLERY IMPRESSIONS  1. Left ventricular ejection fraction, by estimation, is 65 to 70%. The left ventricle has normal function. The left ventricle has no regional wall motion abnormalities. Left ventricular diastolic parameters are consistent with Grade I diastolic dysfunction (impaired relaxation).  2. Right ventricular systolic function is normal. The right ventricular size is normal. There is normal pulmonary artery systolic pressure. The estimated right ventricular systolic pressure is 22.0 mmHg.  3. The mitral valve is normal in structure. Trivial mitral valve regurgitation.  4. The aortic valve is tricuspid. Aortic valve regurgitation is not visualized. No aortic stenosis is present.  5. The inferior vena cava is normal in size with greater than 50% respiratory variability, suggesting right atrial pressure of 3 mmHg. FINDINGS  Left Ventricle: Left ventricular ejection fraction, by estimation, is 65 to 70%. The left ventricle has normal function. The left ventricle has no regional wall motion abnormalities. The left ventricular internal cavity size was normal in size. There is  borderline concentric left ventricular hypertrophy. Left ventricular diastolic parameters are consistent with Grade I diastolic dysfunction (impaired relaxation). Right Ventricle: The right ventricular size is normal. No increase in right ventricular wall thickness. Right ventricular systolic function is normal. There is normal pulmonary artery systolic pressure. The tricuspid regurgitant velocity is 2.18 m/s, and  with an assumed right atrial pressure of 3 mmHg, the estimated right ventricular systolic pressure is 22.0 mmHg. Left Atrium: Left atrial size was normal in size. Right Atrium: Right atrial size was normal in size. Pericardium: There is no evidence of pericardial  effusion. Presence of epicardial fat layer. Mitral Valve: The mitral valve is normal in structure. Trivial mitral valve regurgitation. Tricuspid Valve: The tricuspid valve is normal in structure. Tricuspid valve regurgitation is trivial. Aortic Valve: The aortic valve is tricuspid. Aortic valve regurgitation is not visualized. No aortic stenosis is present. Pulmonic Valve: The pulmonic valve was normal in structure. Pulmonic valve regurgitation is not visualized. Aorta: The aortic root and ascending aorta are structurally normal, with no evidence of dilitation. Venous: The inferior vena cava is normal in size with greater than 50% respiratory variability, suggesting right atrial pressure of 3 mmHg. IAS/Shunts: The atrial septum is grossly normal. Additional Comments: A is visualized in the right atrium and right ventricle.  LEFT VENTRICLE PLAX 2D LVIDd:         3.80 cm   Diastology LVIDs:         2.10 cm   LV e' medial:    5.87 cm/s LV PW:         1.00 cm   LV E/e' medial:  15.9 LV IVS:        1.00 cm   LV e' lateral:   5.98 cm/s LVOT diam:     1.90 cm   LV E/e' lateral: 15.7 LV SV:         65 LV SV Index:  41 LVOT Area:     2.84 cm  RIGHT VENTRICLE RV S prime:     13.70 cm/s TAPSE (M-mode): 1.7 cm LEFT ATRIUM             Index        RIGHT ATRIUM           Index LA diam:        2.70 cm 1.70 cm/m   RA Area:     11.70 cm LA Vol (A2C):   20.7 ml 13.01 ml/m  RA Volume:   21.60 ml  13.57 ml/m LA Vol (A4C):   27.8 ml 17.47 ml/m LA Biplane Vol: 24.2 ml 15.21 ml/m  AORTIC VALVE LVOT Vmax:   101.00 cm/s LVOT Vmean:  72.000 cm/s LVOT VTI:    0.228 m  AORTA Ao Root diam: 2.70 cm Ao Asc diam:  2.40 cm MITRAL VALVE                TRICUSPID VALVE MV Area (PHT): 2.69 cm     TR Peak grad:   19.0 mmHg MV Decel Time: 282 msec     TR Vmax:        218.00 cm/s MV E velocity: 93.60 cm/s MV A velocity: 126.00 cm/s  SHUNTS MV E/A ratio:  0.74         Systemic VTI:  0.23 m                             Systemic Diam: 1.90 cm  Carson Clara MD Electronically signed by Carson Clara MD Signature Date/Time: 05/14/2023/1:46:56 PM    Final    CT HEAD WO CONTRAST ( ) Result Date: 05/14/2023 CLINICAL DATA:  Vertigo with central dizziness. EXAM: CT HEAD WITHOUT CONTRAST TECHNIQUE: Contiguous axial images were obtained from the base of the skull through the vertex without intravenous contrast. RADIATION DOSE REDUCTION: This exam was performed according to the departmental dose-optimization program which includes automated exposure control, adjustment of the mA and/or kV according to patient size and/or use of iterative reconstruction technique. COMPARISON:  07/16/2016 FINDINGS: Brain: No evidence of acute infarction, hemorrhage, hydrocephalus, extra-axial collection or mass lesion/mass effect. 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 Age normal brain volume. Vascular: No hyperdense vessel or unexpected calcification. Skull: Normal. Negative for fracture or focal lesion. Sinuses/Orbits: Bilateral glaucoma reservoir implant. IMPRESSION: No acute finding or change from 2018. Electronically Signed   By: Ronnette Coke M.D.   On: 05/14/2023 05:34   DG Chest 2 View Result Date: 05/13/2023 CLINICAL DATA:  Shortness of breath. EXAM: CHEST - 2 VIEW COMPARISON:  02/18/2023. FINDINGS: Bilateral lung fields are clear. Bilateral costophrenic angles are clear. Stable cardio-mediastinal silhouette. There is a left sided 2-lead pacemaker. No acute osseous abnormalities. The soft tissues are within normal limits. IMPRESSION: No active cardiopulmonary disease. Electronically Signed   By: Beula Brunswick M.D.   On: 05/13/2023 15:54       Ellias Mcelreath M.D. [facility] 05/14/2023, 2:54 PM  Available via Epic secure chat 7am-7pm After 7 pm, please refer to night coverage provider listed on amion.

## 2023-05-14 NOTE — Telephone Encounter (Signed)
Patient currently in hospital.

## 2023-05-14 NOTE — H&P (Addendum)
 PCP:   Aldo Hun, MD   Chief Complaint:  Dizziness  HPI: This a 78 year old female with past medical history of CHB sp PPM 2013, HTN, T2DM, rheumatoid arthritis, benign positional vertigo.  Per patient since April 2021 whenever her pacemaker is interrogated which shows that it is making a "noise".  Function remains normal.  Per patient on Sunday she developed a persistent dizziness.  On Monday she had a small episode.  Today Tuesday her dizziness was really bad.  She went with her husband to his doctor visits at the [place] developed severe dizziness that persisted until she presented to [facility] earlier this evening.  At time she felt presyncopal.  She denies headache, palpitation, chest pains.  She denies prior illness including nausea or vomiting, fever or chills.  She denies any localized weakness or confusion.  She states her legs are weak [both] but this is chronic worse since being dizzy.  She is a case that baseline.  She denies any new medication or change in medications.  At [facility] presenting vitals 153/56, 77, 20, afebrile.  Labs reassuring.  Glucose 209.  Troponin 7=> 7.  BNP 72.5.  EKG AV paced QT 510.  Hemoglobin 8.9 baseline.  CXR negative.  D-dimer <0.27 Pacemaker interrogated.  Normal function.  Two brief noises not new finding.  Readings to be forwarded to Dr. Berry Bristol his cardiologist.  Cardiology on-call contacted.  Recommended transfer with EP consult.  Review of Systems:  Per HPI  Past Medical History: Past Medical History:  Diagnosis Date   Anxiety    Atrioventricular block, complete -intermittent        Complete heart block (HCC) 04/14/2012   Diabetes mellitus without complication (HCC)    per patient.    Drug-induced hyperkalemia -associated with Aldactone    Encounter for care of pacemaker 10/27/2018   GERD (gastroesophageal reflux disease)    Glaucoma    bilateral   HTN (hypertension)    Left bundle branch block    Lightheadedness    Associated with exercise    Oversensing on the atrial lead 08/27/2013   Pacemaker    St Jude   Paresthesia of both legs 03/02/2015   Rheumatoid arthritis (HCC)    Sinus node dysfunction (HCC) 02/17/2019   Spinal stenosis of lumbar region 03/02/2015   L4-5   Syncope    Past Surgical History:  Procedure Laterality Date   BLADDER SURGERY     sling   EYE SURGERY     several, bilateral   LOOP RECORDER IMPLANT N/A 09/20/2011   Procedure: LOOP RECORDER IMPLANT;  Surgeon: Verona Goodwill, MD;  Location: [facility] CATH LAB;  Service: Cardiovascular;  Laterality: N/A;   PERMANENT PACEMAKER INSERTION N/A 04/15/2012    Medications: Prior to Admission medications   Medication Sig Start Date End Date Taking? Authorizing Provider  acetaminophen  (TYLENOL ) 650 MG CR tablet Take 650 mg by mouth daily as needed (for headaches, discomfort or back pain).    [provider]  aspirin  EC 81 MG tablet Take 81 mg by mouth every other day.    [provider]  Cholecalciferol 50 MCG (2000 UT) CAPS Take by oral route.    [provider]  DORZOLAMIDE  HCL OP Apply to eye daily.    [provider]  estradiol  (ESTRACE ) 0.1 MG/GM vaginal cream PLACE 0.5G NIGHTLY 2-3 TIMES A WEEK 12/06/22   Arma Lamp, MD  latanoprost  (XALATAN ) 0.005 % ophthalmic solution 1 drop at bedtime.  [provider]  LORazepam (ATIVAN) 0.5 MG tablet 0.75 mg as needed. 08/03/20   [provider]  losartan  (COZAAR ) 25 MG tablet Take 1 tablet (25 mg total) by mouth daily. Take with 50 mg tablet for a total of 75 mg daily 05/08/23 05/07/24  Knox Perl, MD  losartan  (COZAAR ) 50 MG tablet Take 1 tablet (50 mg total) by mouth daily. Take with 25 mg tablet for a total of 75 mg daily 05/08/23 08/06/23  Knox Perl, MD  meclizine (ANTIVERT) 25 MG tablet Take 25 mg by mouth 3 (three) times daily as needed. For dizziness/vertigo    [provider]  NON FORMULARY Eye bright    [provider]  Nutritional Supplements  (JUICE PLUS FIBRE PO) Take by mouth 2 (two) times daily. Patient not taking: Reported on 12/06/2022    [provider]  Probiotic Product (PROBIOTIC ADVANCED PO) Take by mouth. Patient not taking: Reported on 12/06/2022    [provider]  timolol  (BETIMOL ) 0.25 % ophthalmic solution 1-2 drops 2 (two) times daily.    [provider]  Vibegron  (GEMTESA ) 75 MG TABS Take 1 tablet (75 mg total) by mouth daily. 12/06/22   Arma Lamp, MD    Allergies:   Allergies  Allergen Reactions   Codeine Other (See Comments)    Makes the patient feel like she has "cotton mouth" and "weird"; ineffective, also   Contrast Media [Iodinated Contrast Media] Other (See Comments)    Numbness, burning   Penicillins Other (See Comments)    "Childhood allergy" Has patient had a PCN reaction causing immediate rash, facial/tongue/throat swelling, SOB or lightheadedness with hypotension: Unk Has patient had a PCN reaction causing severe rash involving mucus membranes or skin necrosis: Unk Has patient had a PCN reaction that required hospitalization: Unk Has patient had a PCN reaction occurring within the last 10 years: No If all of the above answers are "NO", then may proceed with Cephalosporin use.    Sulfa Drugs Cross Reactors Other (See Comments)    Reaction unknown (allergy is from childhood)   Wound Dressing Adhesive Other (See Comments)    Bandaids- Red, blisters   Chlorhexidine  Rash   Latex Rash    No purple gloves   Povidone Rash    Social History:  reports that she has never smoked. She has never used smokeless tobacco. She reports that she does not currently use alcohol . She reports that she does not use drugs.  Family History: Family History  Problem Relation Age of Onset   Leukemia Mother    Stroke Father    Heart failure Father    Heart attack Father    Heart disease Father    Diabetes Sister    Asthma Brother    Restless legs syndrome Brother      Physical Exam: Vitals:   05/14/23 0300 05/14/23 0338 05/14/23 0405 05/14/23 0416  BP: (!) 145/50 (!) 126/55    Pulse: (!) 40 71    Resp: 18 16    Temp:  97.6 F (36.4 C)    TempSrc:  Oral    SpO2: 100%  97% 96%  Weight:  55.8 kg    Height:  5\' 4"  (1.626 m)      General:  Alert and oriented times three, well developed and nourished, no acute distress Eyes: Pink conjunctiva, no scleral icterus ENT: Moist oral mucosa, neck supple, no thyromegaly Lungs: clear to ascultation, no crackles, no use of accessory muscles Cardiovascular: RRR, no  regurgitation, no murmurs. No carotid bruits, no JVD Abdomen: soft, positive BS, non-tender, non-distended, no organomegaly, not an acute abdomen GU: not examined Neuro: CN II - XII grossly intact, sensation intact Musculoskeletal: strength 5/5 all extremities, no edema Skin: no rash, no subcutaneous crepitation, no decubitus Psych: appropriate patient   Labs on Admission:  Recent Labs    05/13/23 1445 05/13/23 1455  NA 134*  --   K 3.6  --   CL 101  --   CO2 24  --   GLUCOSE 209*  --   BUN 12  --   CREATININE 0.71  --   CALCIUM 9.5  --   MG  --  2.1   Recent Labs    05/13/23 1445  AST 24  ALT 19  ALKPHOS 63  BILITOT 0.4  PROT 7.0  ALBUMIN 4.7    Recent Labs    05/13/23 1445  WBC 9.6  HGB 8.9*  HCT 28.4*  MCV 79.1*  PLT 299    Recent Labs    05/13/23 1640  DDIMER <0.27    Radiological Exams on Admission: DG Chest 2 View Result Date: 05/13/2023 CLINICAL DATA:  Shortness of breath. EXAM: CHEST - 2 VIEW COMPARISON:  02/18/2023. FINDINGS: Bilateral lung fields are clear. Bilateral costophrenic angles are clear. Stable cardio-mediastinal silhouette. There is a left sided 2-lead pacemaker. No acute osseous abnormalities. The soft tissues are within normal limits. IMPRESSION: No active cardiopulmonary disease. Electronically Signed   By: Beula Brunswick M.D.   On: 05/13/2023 15:54    Assessment/Plan Present on  Admission:  Dizziness //  near syncope -DDx: Arrhythmias, other cardiac etiologies, posterior circulation infarct, brain mass, illness. -Patient's pacemaker is not MRI compatible.  CT head w/o contrast ordered -Orthostatic vitals.  Respiratory panel ordered. -A.m. team to consult Dr. Berry Bristol team -2D echo ordered.  D-dimer normal -IV fluid hydration   Complete heart block (HCC)  Pacemaker--St Jude Accent DR 2110 dual chamber pacemaker12/18/2013 -See above   T2DM -Sliding scale insulin  ordered   Elevated blood pressure -Cozaar  7 5 mg p.o. daily resumed, patient not orthostatic   History of rheumatoid arthritis  Asaad Gulley 05/14/2023, 4:23 AM

## 2023-05-14 NOTE — ED Notes (Signed)
 Report called to [facility] and given to Amy

## 2023-05-15 ENCOUNTER — Encounter (HOSPITAL_COMMUNITY): Payer: Self-pay | Admitting: Cardiovascular Disease

## 2023-05-15 ENCOUNTER — Inpatient Hospital Stay (HOSPITAL_COMMUNITY): Payer: Medicare Other

## 2023-05-15 DIAGNOSIS — T829XXD Unspecified complication of cardiac and vascular prosthetic device, implant and graft, subsequent encounter: Secondary | ICD-10-CM | POA: Diagnosis not present

## 2023-05-15 DIAGNOSIS — E119 Type 2 diabetes mellitus without complications: Secondary | ICD-10-CM

## 2023-05-15 DIAGNOSIS — R55 Syncope and collapse: Secondary | ICD-10-CM | POA: Diagnosis not present

## 2023-05-15 LAB — BASIC METABOLIC PANEL
Anion gap: 8 (ref 5–15)
BUN: 14 mg/dL (ref 8–23)
CO2: 21 mmol/L — ABNORMAL LOW (ref 22–32)
Calcium: 9.2 mg/dL (ref 8.9–10.3)
Chloride: 105 mmol/L (ref 98–111)
Creatinine, Ser: 0.64 mg/dL (ref 0.44–1.00)
GFR, Estimated: >60 mL/min (ref >60)
Glucose, Bld: 172 mg/dL — ABNORMAL HIGH (ref 70–99)
Potassium: 3.9 mmol/L (ref 3.5–5.1)
Sodium: 134 mmol/L — ABNORMAL LOW (ref 135–145)

## 2023-05-15 LAB — CBC
HCT: 28.2 % — ABNORMAL LOW (ref 36.0–46.0)
Hemoglobin: 8.7 g/dL — ABNORMAL LOW (ref 12.0–15.0)
MCH: 24.8 pg — ABNORMAL LOW (ref 26.0–34.0)
MCHC: 30.9 g/dL (ref 30.0–36.0)
MCV: 80.3 fL (ref 80.0–100.0)
Platelets: 291 10*3/uL (ref 150–400)
RBC: 3.51 MIL/uL — ABNORMAL LOW (ref 3.87–5.11)
RDW: 16.7 % — ABNORMAL HIGH (ref 11.5–15.5)
WBC: 10.9 10*3/uL — ABNORMAL HIGH (ref 4.0–10.5)
nRBC: 0 % (ref 0.0–0.2)

## 2023-05-15 MED FILL — Vancomycin HCl IV Soln 1000 MG/200ML (Base Equivalent): INTRAVENOUS | Qty: 200 | Status: AC

## 2023-05-15 MED FILL — Lidocaine HCl Local Inj 1%: INTRAMUSCULAR | Qty: 60 | Status: AC

## 2023-05-15 NOTE — Progress Notes (Signed)
Discharge instructions given; AVS reviewed. Patient verbalized understanding and all questions answered.

## 2023-05-15 NOTE — TOC CM/SW Note (Signed)
Transition of Care [facility]) - Inpatient Brief Assessment   Patient Details  Name: Charlene Barker MRN: [id number] Date of Birth: 24-Apr-1946  [facility] CM/SW Contact:    Gala Lewandowsky, RN Phone Number: 05/15/2023, 12:48 PM   Clinical Narrative: Patient will transition home today. Patient previously had outpatient PT at [facility]. Patient did not want to return; so patient agreeable to [address] [employer]. Patient states she will have transportation to appointments. NO further needs identified.    Transition of Care Asessment: Insurance and Status: Insurance coverage has been reviewed Patient has primary care physician: Yes Home environment has been reviewed: reviewed Prior level of function:: independent Prior/Current Home Services: No current home services Social Drivers of Health Review: SDOH reviewed no interventions necessary Readmission risk has been reviewed: Yes Transition of care needs: no transition of care needs at this time

## 2023-05-15 NOTE — Evaluation (Signed)
Physical Therapy Evaluation Patient Details Name: Charlene Barker MRN: [id number] DOB: February 12, 1946 Today's Date: 05/15/2023  History of Present Illness  Pt is a 78 y.o. female who presented 05/13/23 with presyncope. Admitted for PPM malfunction. S/p RV lead re-insertion with capping of previous lead 1/15. PMH: CHB sp PPM 2013, HTN, T2DM, rheumatoid arthritis, benign positional vertigo, glaucoma   Clinical Impression  Pt presents with condition above and deficits mentioned below, see PT Problem List. PTA, she was independent, typically not utilizing an AD. She lives with her husband in a 1-level house with 4 STE. Currently, pt demonstrates deficits in gross strength, balance, and activity tolerance. She is at risk for falls. She reports continued dizziness, but does not appear to be as severe as it was prior to admission. VSS except questionable alarm on monitor stating intermittent "V Tach". Noted 2-3 L beating nystagmus with L end gaze but no definitive nystagmus at R end gaze, pt skeptical about BPPV treatment thus deferred further assessment at this time, RN aware. The pt did not display LOB or need physical assistance with functional mobility today. She does benefit from the RW for improved support when ambulating and from the handrail to navigate stairs at this time though. Educated pt on her pacemaker precautions and provided her with a handout. She would continue to benefit from resuming OPPT to address her deficits above and reduce her risk for falls. Will continue to follow acutely.      If plan is discharge home, recommend the following: A little help with walking and/or transfers;A little help with bathing/dressing/bathroom;Assistance with cooking/housework;Assist for transportation;Help with stairs or ramp for entrance   Can travel by private vehicle        Equipment Recommendations None recommended by PT  Recommendations for Other Services  OT consult    Functional Status  Assessment Patient has had a recent decline in their functional status and demonstrates the ability to make significant improvements in function in a reasonable and predictable amount of time.     Precautions / Restrictions Precautions Precautions: ICD/Pacemaker;Fall Required Braces or Orthoses: Sling (L UE as needed) Restrictions Weight Bearing Restrictions Per Provider Order: Yes LUE Weight Bearing Per Provider Order: Non weight bearing Other Position/Activity Restrictions: Pacemaker precautions L UE      Mobility  Bed Mobility Overal bed mobility: Needs Assistance Bed Mobility: Supine to Sit     Supine to sit: Supervision, HOB elevated     General bed mobility comments: Supervision for safety, HOB elevated    Transfers Overall transfer level: Needs assistance Equipment used: Rolling walker (2 wheels) Transfers: Sit to/from Stand Sit to Stand: Supervision           General transfer comment: Supervision for safety, no LOB    Ambulation/Gait Ambulation/Gait assistance: Contact guard assist, Supervision Gait Distance (Feet): 380 Feet Assistive device: Rolling walker (2 wheels), None Gait Pattern/deviations: Step-through pattern, Decreased stride length Gait velocity: reduced Gait velocity interpretation: <1.8 ft/sec, indicate of risk for recurrent falls   General Gait Details: Pt ambulated the majority of the distance with RW support, progressing to no AD the final ~50 ft. Supervision for safety when utilizing the RW, CGA for safety when not utilizing an AD as pt was slower and more cautious without UE support.  Stairs Stairs: Yes Stairs assistance: Contact guard assist Stair Management: One rail Right, One rail Left, Step to pattern, Forwards Number of Stairs: 3 General stair comments: Ascends with R rail and descends with L rail, step-to pattern,  no overt LOB, CGA for safety, slow and cautious  Wheelchair Mobility     Tilt Bed    Modified Rankin (Stroke  Patients Only)       Balance Overall balance assessment: Mild deficits observed, not formally tested                                           Pertinent Vitals/Pain Pain Assessment Pain Assessment: Faces Faces Pain Scale: Hurts little more Pain Location: L UE Pain Descriptors / Indicators: Discomfort, Operative site guarding Pain Intervention(s): Limited activity within patient's tolerance, Monitored during session, Repositioned    Home Living Family/patient expects to be discharged to:: Private residence Living Arrangements: Spouse/significant other Available Help at Discharge: Family;Available 24 hours/day Type of Home: House Home Access: Stairs to enter Entrance Stairs-Rails: None Entrance Stairs-Number of Steps: 4   Home Layout: One level Home Equipment: Agricultural consultant (2 wheels);Toilet riser;Cane - single point      Prior Function Prior Level of Function : Independent/Modified Independent             Mobility Comments: No AD majority of time, reports she intermittently utilizes a cane       Extremity/Trunk Assessment   Upper Extremity Assessment Upper Extremity Assessment: Defer to OT evaluation (hold L UE in flexed position near body to protect it)    Lower Extremity Assessment Lower Extremity Assessment: Generalized weakness       Communication   Communication Communication: No apparent difficulties  Cognition Arousal: Alert Behavior During Therapy: WFL for tasks assessed/performed Overall Cognitive Status: Within Functional Limits for tasks assessed                                          General Comments General comments (skin integrity, edema, etc.): VSS on RA except monitor alarming "V Tach" intermittently but did not see it on wave forms; pt reports continued dizziness, noted 2-3 L beating nystagmus with L end gaze but no definitive nystagmus at R end gaze, pt skeptical about BPPV treatment thus deferred  further assessment at this time, RN aware    Exercises     Assessment/Plan    PT Assessment Patient needs continued PT services  PT Problem List Decreased strength;Decreased activity tolerance;Decreased balance;Decreased mobility;Decreased knowledge of precautions       PT Treatment Interventions DME instruction;Gait training;Stair training;Functional mobility training;Therapeutic activities;Therapeutic exercise;Balance training;Neuromuscular re-education;Patient/family education    PT Goals (Current goals can be found in the Care Plan section)  Acute Rehab PT Goals Patient Stated Goal: to improve PT Goal Formulation: With patient Time For Goal Achievement: 05/29/23 Potential to Achieve Goals: Good    Frequency Min 1X/week     Co-evaluation               AM-PAC PT "6 Clicks" Mobility  Outcome Measure Help needed turning from your back to your side while in a flat bed without using bedrails?: A Little Help needed moving from lying on your back to sitting on the side of a flat bed without using bedrails?: A Little Help needed moving to and from a bed to a chair (including a wheelchair)?: A Little Help needed standing up from a chair using your arms (e.g., wheelchair or bedside chair)?: A Little Help needed to walk in hospital  room?: A Little Help needed climbing 3-5 steps with a railing? : A Little 6 Click Score: 18    End of Session Equipment Utilized During Treatment: Gait belt Activity Tolerance: Patient tolerated treatment well Patient left: in chair;with call bell/phone within reach;with chair alarm set (no cord to nurse's desk) Nurse Communication: Mobility status;Other (comment) (dizziness, vitals, no chair alarm cord to alarm nursing desk) PT Visit Diagnosis: Unsteadiness on feet (R26.81);Other abnormalities of gait and mobility (R26.89);Muscle weakness (generalized) (M62.81);Difficulty in walking, not elsewhere classified (R26.2);Dizziness and giddiness (R42)     Time: 1005-1050 PT Time Calculation (min) (ACUTE ONLY): 45 min   Charges:   PT Evaluation $PT Eval Moderate Complexity: 1 Mod PT Treatments $Gait Training: 8-22 mins $Therapeutic Activity: 8-22 mins PT General Charges $$ ACUTE PT VISIT: 1 Visit         Virgil Benedict, PT, DPT Acute Rehabilitation Services  Office: [phone]   Bettina Gavia 05/15/2023, 11:02 AM

## 2023-05-15 NOTE — Discharge Instructions (Signed)
After Your Pacemaker   You have a [employer] Pacemaker  ACTIVITY Do not lift your arm above shoulder height for 1 week after your procedure. After 7 days, you may progress as below.  You should remove your sling 24 hours after your procedure, unless otherwise instructed by your provider.     Thursday May 22, 2023  Friday May 23, 2023 Saturday May 24, 2023 Sunday May 25, 2023   Do not lift, push, pull, or carry anything over 10 pounds with the affected arm until 6 weeks (Thursday June 26, 2023 ) after your procedure.   You may drive AFTER your wound check, unless you have been told otherwise by your provider.   Ask your healthcare provider when you can go back to work   INCISION/Dressing If you are on a blood thinner such as Coumadin, Xarelto, Eliquis, Plavix, or Pradaxa please confirm with your provider when this should be resumed.   If large square, outer bandage is left in place, this can be removed after 24 hours from your procedure. Do not remove steri-strips or glue as below.   If a PRESSURE DRESSING (a bulky dressing that usually goes up over your shoulder) was applied or left in place, please follow instructions given by your provider on when to return to have this removed.   Monitor your Pacemaker site for redness, swelling, and drainage. Call the device clinic at [phone] if you experience these symptoms or fever/chills.  If your incision is sealed with Steri-strips or staples, you may shower 7 days after your procedure or when told by your provider. Do not remove the steri-strips or let the shower hit directly on your site. You may wash around your site with soap and water.    If you were discharged in a sling, please do not wear this during the day more than 48 hours after your surgery unless otherwise instructed. This may increase the risk of stiffness and soreness in your shoulder.   Avoid lotions, ointments, or perfumes over your incision until it is  well-healed.  You may use a hot tub or a pool AFTER your wound check appointment if the incision is completely closed.  Pacemaker Alerts:  Some alerts are vibratory and others beep. These are NOT emergencies. Please call our office to let us know. If this occurs at night or on weekends, it can wait until the next business day. Send a remote transmission.  If your device is capable of reading fluid status (for heart failure), you will be offered monthly monitoring to review this with you.   DEVICE MANAGEMENT Remote monitoring is used to monitor your pacemaker from home. This monitoring is scheduled every 91 days by our office. It allows [place] to keep an eye on the functioning of your device to ensure it is working properly. You will routinely see your Electrophysiologist annually (more often if necessary).   You should receive your ID card for your new device in 4-8 weeks. Keep this card with you at all times once received. Consider wearing a medical alert bracelet or necklace.  Your Pacemaker may be MRI compatible. This will be discussed at your next office visit/wound check.  You should avoid contact with strong electric or magnetic fields.   Do not use amateur (ham) radio equipment or electric (arc) welding torches. MP3 player headphones with magnets should not be used. Some devices are safe to use if held at least 12 inches (30 cm) from your Pacemaker. These include power tools, lawn  mowers, and speakers. If you are unsure if something is safe to use, ask your health care provider.  When using your cell phone, hold it to the ear that is on the opposite side from the Pacemaker. Do not leave your cell phone in a pocket over the Pacemaker.  You may safely use electric blankets, heating pads, computers, and microwave ovens.  Call the office right away if: You have chest pain. You feel more short of breath than you have felt before. You feel more light-headed than you have felt before. Your  incision starts to open up.  This information is not intended to replace advice given to you by your health care provider. Make sure you discuss any questions you have with your health care provider.

## 2023-05-15 NOTE — Discharge Summary (Signed)
Physician Discharge Summary   Patient: Charlene Barker MRN: [id number] DOB: 07/22/1945  Admit date:     05/13/2023  Discharge date: 05/15/23  Discharge Physician: Thad Ranger, MD    PCP: Rodrigo Ran, MD   Recommendations at discharge:   EP follow-up scheduled on 05/28/2023 for pacemaker wound check  Discharge Diagnoses:    Near syncope with persistent dizziness   Pacemaker malfunction status post reinsertion   Complete heart block (HCC)  Diabetes mellitus type 2  Hypertension  Hospital Course: Patient is a 78 year old female with CHB status post pacemaker in 2013, HTN, type II DM, rheumatoid arthritis, vertigo presented with persistent dizziness.  Per patient, on Sunday she developed persistent dizziness however she has been having the dizziness off and on for the last 2 weeks.  Denied any headache, palpitation or chest pain.  No new medications or change in medications. Remote interrogation 05/12/2023 had shown noise, but did NOT demonstrate dependence with V pacing only ~ 5%  Patient was admitted for further workup, EP cardiology consulted.  Assessment and Plan:  Persistent dizziness, near syncope in the setting of complete heart block -History of complete heart block status post pacemaker--St Jude Accent DR 2110 dual chamber pacemaker12/18/2013 -In the setting of CHB, pacemaker malfunction -D-dimer normal -2D echo showed EF of 65 to 70%, G1 DD, normal right ventricular systolic function -EP cardiology was consulted, found to have a ventricular lead noise with oversensing.  Status post RV lead reinsertion with capping of previous lead. -Device interrogation with appropriate measurements.  She is cleared to discharge home from EP perspective.  Outpatient follow-ups have been scheduled by EP cardiology.      Diabetes mellitus type 2 -Hemoglobin A1c 5.9 -Diet controlled, patient is not on any oral hypoglycemics outpatient and declined to be on sliding scale insulin while  inpatient    Hypertension - continue losartan      Estimated body mass index is 21.11 kg/m as calculated from the following:   Height as of this encounter: 5\' 4" (1.626 m).   Weight as of this encounter: 55.8 kg.     Pain control -  Controlled Substance Reporting System database was reviewed. and patient was instructed, not to drive, operate heavy machinery, perform activities at heights, swimming or participation in water activities or provide baby-sitting services while on Pain, Sleep and Anxiety Medications; until their outpatient Physician has advised to do so again. Also recommended to not to take more than prescribed Pain, Sleep and Anxiety Medications.  Consultants: EP cardiology Procedures performed: RV lead re-insertion with capping of previous lead   Disposition: Home Diet recommendation:  Discharge Diet Orders (From admission, onward)     Start     Ordered   05/15/23 0000  Diet Carb Modified        01 /16/25 1120            DISCHARGE MEDICATION: Allergies as of 05/15/2023       Reactions   Refresh Tears [carboxymethylcellulose Sodium] Other (See Comments)   Unknown reaction   Cipro [ciprofloxacin Hcl] Hives   Cleocin [clindamycin] Itching   Codeine Other (See Comments)   Dry mouth Felt "weird"   Contrast Media [iodinated Contrast Media] Other (See Comments)   Numbness Burning   Penicillins Other (See Comments)   Unknown childhood reaction   Sulfa Drugs Cross Reactors Other (See Comments)   Unknown childhood reaction   Sulfur Dioxide Other (See Comments)   Unknown reaction   Ultram [tramadol] Other (See Comments)  Unknown reaction   Wound Dressing Adhesive Other (See Comments)   Bandaids- Red, blisters   Hibiclens [chlorhexidine Gluconate] Rash   Latex Rash   No purple gloves   Povidone Rash        Medication List     TAKE these medications    acetaminophen 325 MG tablet Commonly known as: TYLENOL Take 975 mg by mouth at  bedtime as needed for moderate pain (pain score 4-6) or headache.   aspirin EC 81 MG tablet Take 81 mg by mouth daily.   b complex vitamins capsule Take 1 capsule by mouth daily.   Cholecalciferol 50 MCG (2000 UT) Caps Take 2,000 Units by mouth daily.   cycloSPORINE 0.05 % ophthalmic emulsion Commonly known as: RESTASIS 1 drop daily.   docusate sodium 100 MG capsule Commonly known as: COLACE Take 100 mg by mouth daily.   dorzolamide 2 % ophthalmic solution Commonly known as: TRUSOPT Place 1 drop into both eyes daily.   Gemtesa 75 MG Tabs Generic drug: Vibegron Take 1 tablet (75 mg total) by mouth daily.   latanoprost 0.005 % ophthalmic solution Commonly known as: XALATAN Place 1 drop into both eyes at bedtime.   LORazepam 0.5 MG tablet Commonly known as: ATIVAN Take 0.25 mg by mouth daily as needed for anxiety.   losartan 50 MG tablet Commonly known as: COZAAR Take 1 tablet (50 mg total) by mouth daily. Take with 25 mg tablet for a total of 75 mg daily   losartan 25 MG tablet Commonly known as: COZAAR Take 1 tablet (25 mg total) by mouth daily. Take with 50 mg tablet for a total of 75 mg daily   timolol 0.5 % ophthalmic solution Commonly known as: BETIMOL Place 1 drop into both eyes daily.   vitamin C 1000 MG tablet Take 1,000 mg by mouth daily.               Discharge Care Instructions  (From admission, onward)           Start     Ordered   05/15/23 0000  If the dressing is still on your incision site when you go home, remove it on the third day after your surgery date. Remove dressing if it begins to fall off, or if it is dirty or damaged before the third day.        05/15/23 1120            Follow-up Information     Rodrigo Ran, MD. Schedule an appointment as soon as possible for a visit in 2 week(s).   Specialty: Internal Medicine Why: for hospital follow-up Contact information: [address] [place]  [address] [phone]                Discharge Exam: Ceasar Mons Weights   05/14/23 0338  Weight: 55.8 kg   S: Patient feels much better today, status post addition of new RV lead and capping of the old lead.  Pacemaker dependent.  Cleared by EP cardiology to discharge home.  BP (!) 125/49   Pulse 80   Temp 98.7 F (37.1 C) (Oral)   Resp (!) 21   Ht 5\' 4"  (1.626 m)   Wt 55.8 kg   SpO2 99%   BMI 21.11 kg/m   Physical Exam General: Alert and oriented x 3, NAD Cardiovascular: S1 S2 clear, RRR.  Dressing intact on the left chest wall pacemaker site Respiratory: CTAB, no wheezing Gastrointestinal: Soft, nontender, nondistended, NBS Ext: no  pedal edema bilaterally Neuro: no new deficits Psych: Normal affect    Condition at discharge: fair  The results of significant diagnostics from this hospitalization (including imaging, microbiology, ancillary and laboratory) are listed below for reference.   Imaging Studies: DG Chest 2 View Result Date: 05/15/2023 CLINICAL DATA:  [id number] Cardiac device in situ, other [phone] EXAM: CHEST - 2 VIEW COMPARISON:  05/13/2023 FINDINGS: Interval left subclavian approach pacemaker exchange. Lead attached to the right atrium and 2 leads attached to the right ventricle. Stable heart size. Both lungs are clear. No pleural effusion or pneumothorax. IMPRESSION: Interval pacemaker exchange. No pneumothorax. Electronically Signed   By: Duanne Guess D.O.   On: 05/15/2023 10:09   EP PPM/ICD IMPLANT Result Date: 05/14/2023 Table formatting from the original result was not included. SURGEON:  Maurice Small, MD    PREPROCEDURE DIAGNOSES:  1. RV lead malfunction    POSTPROCEDURE DIAGNOSES:  Same as preprocedure.  PROCEDURES:   1. Dual chamber pacemaker pulse generator replacement  2. Implantation of a new RV lead in the LBB area  3. Capping of existing, malfunctioning RV lead   INTRODUCTION:  YAMILES BOULAY is a 78 y.o. female with Complete heart block and  prior dual chamber pacemaker implantation with a malfunctioning RV lead who presents today for RV lead replacement and generator replacement.   DESCRIPTION OF PROCEDURE:  Informed written consent was obtained and the patient was brought to the electrophysiology lab in the fasting state. After premedication with benadryl and solumedrol, a left upper extremity venogram was performed to confirm patency of the axillary and subclavian veins. Moderate sedation was administered during the procedure.  The patient's left chest was prepped and draped in the usual sterile fashion by the EP lab staff.  The skin overlying the left deltopectoral region was infiltrated with lidocaine for local analgesia.  An incision was made over the existing pocket.  Electrocautery was used with the plasmablade to assure hemostasis.  The device was exposed and removed from the pocket.  The left axillary vein was cannulated using modified seldinger techniques.  Through the left axillary vein, an RV pace/sense lead was advanced to the RV mid septum and advanced deeply, pausing intermittently to assess lead parameters. The lead was secured to the pectoral fascia. The RA lead was  Lead parameters are detailed below.  RA RV Model 1944 1231 Serial # [id number] Amplitude 3.6 mV NA mV Threshold 0.5 V @ 0.5 S 0.7 V @ 0.5 S Impedence 440 ohms 560 ohms The pocket was irrigated with copious gentamicin solution.  The leads were then  connected to a pulse generator (model Accent DR, serial [id number]). The pocket was closed in layers of absorbable suture. EBL < 10mL. Steri-strips and a sterile dressing were applied.  During this procedure the patient is administered Versed and Fentanyl by a trained provider under my direct supervision to achieve and maintain moderate conscious sedation.  The patient's heart rate, blood pressure, and oxygen saturation are monitored continuously during the procedure. The period of conscious sedation is 45 minutes, of  which I was present face-to-face 100% of this time.    CONCLUSIONS:  1. Successful implantation of an RV lead  2. Capping of the old RV lead  3. Implantation of a new St Jude dual chamber pacemaker generator  4.  No early apparent complications. York Pellant, MD Cardiac Electrophysiology   ECHOCARDIOGRAM COMPLETE Result Date: 05/14/2023    ECHOCARDIOGRAM REPORT   Patient Name:   OMAYA WINBURN  Hugh Date of Exam: 05/14/2023 Medical Rec #:  [id number]       Height:       64.0 in Accession #:    [id number]      Weight:       123.0 lb Date of Birth:  06-15-1945      BSA:          1.591 m Patient Age:    89 years        BP:           160/60 mmHg Patient Gender: F               HR:           71 bpm. Exam Location:  Inpatient Procedure: 2D Echo, Color Doppler and Cardiac Doppler Indications:    I44.2 Complete heart block  History:        Patient has prior history of Echocardiogram examinations, most                 recent 08/26/2020. Pacemaker, Arrythmias:LBBB; Risk                 Factors:Hypertension and Diabetes.  Sonographer:    Irving Burton Senior RDCS Referring Phys: [id number] Mariam Dollar TILLERY IMPRESSIONS  1. Left ventricular ejection fraction, by estimation, is 65 to 70%. The left ventricle has normal function. The left ventricle has no regional wall motion abnormalities. Left ventricular diastolic parameters are consistent with Grade I diastolic dysfunction (impaired relaxation).  2. Right ventricular systolic function is normal. The right ventricular size is normal. There is normal pulmonary artery systolic pressure. The estimated right ventricular systolic pressure is 22.0 mmHg.  3. The mitral valve is normal in structure. Trivial mitral valve regurgitation.  4. The aortic valve is tricuspid. Aortic valve regurgitation is not visualized. No aortic stenosis is present.  5. The inferior vena cava is normal in size with greater than 50% respiratory variability, suggesting right atrial pressure of 3 mmHg. FINDINGS  Left  Ventricle: Left ventricular ejection fraction, by estimation, is 65 to 70%. The left ventricle has normal function. The left ventricle has no regional wall motion abnormalities. The left ventricular internal cavity size was normal in size. There is  borderline concentric left ventricular hypertrophy. Left ventricular diastolic parameters are consistent with Grade I diastolic dysfunction (impaired relaxation). Right Ventricle: The right ventricular size is normal. No increase in right ventricular wall thickness. Right ventricular systolic function is normal. There is normal pulmonary artery systolic pressure. The tricuspid regurgitant velocity is 2.18 m/s, and  with an assumed right atrial pressure of 3 mmHg, the estimated right ventricular systolic pressure is 22.0 mmHg. Left Atrium: Left atrial size was normal in size. Right Atrium: Right atrial size was normal in size. Pericardium: There is no evidence of pericardial effusion. Presence of epicardial fat layer. Mitral Valve: The mitral valve is normal in structure. Trivial mitral valve regurgitation. Tricuspid Valve: The tricuspid valve is normal in structure. Tricuspid valve regurgitation is trivial. Aortic Valve: The aortic valve is tricuspid. Aortic valve regurgitation is not visualized. No aortic stenosis is present. Pulmonic Valve: The pulmonic valve was normal in structure. Pulmonic valve regurgitation is not visualized. Aorta: The aortic root and ascending aorta are structurally normal, with no evidence of dilitation. Venous: The inferior vena cava is normal in size with greater than 50% respiratory variability, suggesting right atrial pressure of 3 mmHg. IAS/Shunts: The atrial septum is grossly normal. Additional Comments: A is visualized in the right atrium  and right ventricle.  LEFT VENTRICLE PLAX 2D LVIDd:         3.80 cm   Diastology LVIDs:         2.10 cm   LV e' medial:    5.87 cm/s LV PW:         1.00 cm   LV E/e' medial:  15.9 LV IVS:        1.00 cm    LV e' lateral:   5.98 cm/s LVOT diam:     1.90 cm   LV E/e' lateral: 15.7 LV SV:         65 LV SV Index:   41 LVOT Area:     2.84 cm  RIGHT VENTRICLE RV S prime:     13.70 cm/s TAPSE (M-mode): 1.7 cm LEFT ATRIUM             Index        RIGHT ATRIUM           Index LA diam:        2.70 cm 1.70 cm/m   RA Area:     11.70 cm LA Vol (A2C):   20.7 ml 13.01 ml/m  RA Volume:   21.60 ml  13.57 ml/m LA Vol (A4C):   27.8 ml 17.47 ml/m LA Biplane Vol: 24.2 ml 15.21 ml/m  AORTIC VALVE LVOT Vmax:   101.00 cm/s LVOT Vmean:  72.000 cm/s LVOT VTI:    0.228 m  AORTA Ao Root diam: 2.70 cm Ao Asc diam:  2.40 cm MITRAL VALVE                TRICUSPID VALVE MV Area (PHT): 2.69 cm     TR Peak grad:   19.0 mmHg MV Decel Time: 282 msec     TR Vmax:        218.00 cm/s MV E velocity: 93.60 cm/s MV A velocity: 126.00 cm/s  SHUNTS MV E/A ratio:  0.74         Systemic VTI:  0.23 m                             Systemic Diam: 1.90 cm Epifanio Lesches MD Electronically signed by Epifanio Lesches MD Signature Date/Time: 05/14/2023/1:46:56 PM    Final    CT HEAD WO CONTRAST ( ) Result Date: 05/14/2023 CLINICAL DATA:  Vertigo with central dizziness. EXAM: CT HEAD WITHOUT CONTRAST TECHNIQUE: Contiguous axial images were obtained from the base of the skull through the vertex without intravenous contrast. RADIATION DOSE REDUCTION: This exam was performed according to the departmental dose-optimization program which includes automated exposure control, adjustment of the mA and/or kV according to patient size and/or use of iterative reconstruction technique. COMPARISON:  07/16/2016 FINDINGS: Brain: No evidence of acute infarction, hemorrhage, hydrocephalus, extra-axial collection or mass lesion/mass effect. 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 Age normal brain volume. Vascular: No hyperdense vessel or unexpected calcification. Skull: Normal. Negative for fracture or focal lesion. Sinuses/Orbits: Bilateral  glaucoma reservoir implant. IMPRESSION: No acute finding or change from 2018. Electronically Signed   By: Tiburcio Pea M.D.   On: 05/14/2023 05:34   DG Chest 2 View Result Date: 05/13/2023 CLINICAL DATA:  Shortness of breath. EXAM: CHEST - 2 VIEW COMPARISON:  02/18/2023. FINDINGS: Bilateral lung fields are clear. Bilateral costophrenic angles are clear. Stable cardio-mediastinal silhouette. There is a left sided 2-lead pacemaker. No acute osseous abnormalities. The soft tissues  are within normal limits. IMPRESSION: No active cardiopulmonary disease. Electronically Signed   By: Jules Schick M.D.   On: 05/13/2023 15:54   CUP PACEART REMOTE DEVICE CHECK Result Date: 05/12/2023 Scheduled remote reviewed. Normal device function.  Numerous short AMS episodes, EGMs show RA lead noise which is not a new finding.  Episode list shows AMS episode lasting 30 minutes on 02/26/23, no EGM available for review. Known noise reversion episodes show noise on RA and RV leads. Next remote 91 days. [place], CVRS   Microbiology: Results for orders placed or performed during the hospital encounter of 05/13/23  MRSA Next Gen by PCR, Nasal     Status: None   Collection Time: 05/14/23  4:30 AM   Specimen: Nasal Mucosa; Nasal Swab  Result Value Ref Range Status   MRSA by PCR Next Gen NOT DETECTED NOT DETECTED Final    Comment: (NOTE) The GeneXpert MRSA Assay (FDA approved for NASAL specimens only), is one component of a comprehensive MRSA colonization surveillance program. It is not intended to diagnose MRSA infection nor to guide or monitor treatment for MRSA infections. Test performance is not FDA approved in patients less than 38 years old. Performed at [facility], [address], [place], [place] [address]   Resp panel by RT-PCR (RSV, Flu A&B, Covid) Anterior Nasal Swab     Status: None   Collection Time: 05/14/23  4:31 AM   Specimen: Anterior Nasal Swab  Result Value Ref Range Status   SARS Coronavirus  2 by RT PCR NEGATIVE NEGATIVE Final   Influenza A by PCR NEGATIVE NEGATIVE Final   Influenza B by PCR NEGATIVE NEGATIVE Final    Comment: (NOTE) The Xpert Xpress SARS-CoV-2/FLU/RSV plus assay is intended as an aid in the diagnosis of influenza from Nasopharyngeal swab specimens and should not be used as a sole basis for treatment. Nasal washings and aspirates are unacceptable for Xpert Xpress SARS-CoV-2/FLU/RSV testing.  Fact Sheet for Patients: BloggerCourse.com  Fact Sheet for Healthcare Providers: SeriousBroker.it  This test is not yet approved or cleared by the [place] FDA and has been authorized for detection and/or diagnosis of SARS-CoV-2 by FDA under an Emergency Use Authorization (EUA). This EUA will remain in effect (meaning this test can be used) for the duration of the COVID-19 declaration under Section 564(b)(1) of the Act, 21 U.S.C. section 360bbb-3(b)(1), unless the authorization is terminated or revoked.     Resp Syncytial Virus by PCR NEGATIVE NEGATIVE Final    Comment: (NOTE) Fact Sheet for Patients: BloggerCourse.com  Fact Sheet for Healthcare Providers: SeriousBroker.it  This test is not yet approved or cleared by the [place] FDA and has been authorized for detection and/or diagnosis of SARS-CoV-2 by FDA under an Emergency Use Authorization (EUA). This EUA will remain in effect (meaning this test can be used) for the duration of the COVID-19 declaration under Section 564(b)(1) of the Act, 21 U.S.C. section 360bbb-3(b)(1), unless the authorization is terminated or revoked.  Performed at [facility], [address], [place], [place] [address]     Labs: CBC: Recent Labs  Lab 05/13/23 1445 05/14/23 0828 05/15/23 0404  WBC 9.6 8.4 10.9*  NEUTROABS  --  3.9  --   HGB 8.9* 8.7* 8.7*  HCT 28.4* 27.6* 28.2*  MCV 79.1* 79.5* 80.3  PLT  299 262 291   Basic Metabolic Panel: Recent Labs  Lab 05/13/23 1445 05/13/23 1455 05/14/23 0828 05/15/23 0404  NA 134*  --  139  134*  K 3.6  --  3.9 3.9  CL 101  --  107 105  CO2 24  --  24 21*  GLUCOSE 209*  --  119* 172*  BUN 12  --  8 14  CREATININE 0.71  --  0.71 0.64  CALCIUM 9.5  --  9.4 9.2  MG  --  2.1  --   --    Liver Function Tests: Recent Labs  Lab 05/13/23 1445  AST 24  ALT 19  ALKPHOS 63  BILITOT 0.4  PROT 7.0  ALBUMIN 4.7   CBG: Recent Labs  Lab 05/14/23 0810 05/14/23 1212  GLUCAP 121* 121*    Discharge time spent: greater than 30 minutes.  Signed: Thad Ranger, MD [facility] 05/15/2023

## 2023-05-15 NOTE — Progress Notes (Signed)
  Patient Name: Charlene Barker Date of Encounter: 05/15/2023  Primary Cardiologist: None Electrophysiologist: Berton Mount > Maurice Small, MD  Interval Summary   The patient is doing well today.   Slept well overnight, having tenderness at PPM site   Vital Signs    Vitals:   05/15/23 0000 05/15/23 0400 05/15/23 0458 05/15/23 0809  BP:   (!) 125/59   Pulse:   70   Resp:   16   Temp:   98.2 F (36.8 C) 98.7 F (37.1 C)  TempSrc:   Oral Oral  SpO2: 94% 92% 96%   Weight:      Height:        Intake/Output Summary (Last 24 hours) at 05/15/2023 0844 Last data filed at 05/14/2023 1512 Gross per 24 hour  Intake 122.05 ml  Output --  Net 122.05 ml   Filed Weights   05/14/23 0338  Weight: 55.8 kg    Physical Exam    GEN- The patient is well appearing, alert and oriented x 3 today.   Lungs- Clear to ausculation bilaterally, normal work of breathing Cardiac- Regular rate and rhythm, no murmurs, rubs or gallops GI- soft, NT, ND, + BS Extremities- no clubbing or cyanosis. No edema  Telemetry    Intermittent AS-VP  (personally reviewed)  Hospital Course    TYNITA SULLENS is a 78 y.o. female with PMH of CHB s/p PPM 2013, HTN, DM2, RA, and BPPV admitted for PPM malfunction  Assessment & Plan    #) CHB #) PPM malfunction Found to have ventricular lead noise with oversensing  She is s/p RV lead re-insertion with capping of previous lead Outer bandage removed, steri-strips remain in place until follow up appts CXR with stable lead placement and no pneumothorax Device interrogation with appropriate measurements  OK to discharge from EP perspective Outpatient follow-up appts scheduled Device information in AVS      For questions or updates, please contact [facility] Please consult [email or web address] for contact info under Cardiology/STEMI.  Signed, Sherie Don, NP  05/15/2023, 8:44 AM

## 2023-05-16 ENCOUNTER — Telehealth: Payer: Self-pay | Admitting: Cardiovascular Disease

## 2023-05-16 LAB — SURGICAL PCR SCREEN
MRSA, PCR: NEGATIVE
Staphylococcus aureus: NEGATIVE

## 2023-05-16 NOTE — Telephone Encounter (Signed)
Patient said that she just had a pacemaker put in and she has some questions that she to ask

## 2023-05-16 NOTE — Telephone Encounter (Addendum)
Spoke with patient.  We reviewed arm movement orders as given to her by the hospital.  She says the paper she was given from the hospital with arm movement dates is different that what I am reading from on discharge instructions paperwork.  I told her if nurse went over those specific written papers she has with dates, then I would follow what hospital gave her.   We reviewed wound care instructions.   In addition, she slept with her arm elevated above head last night and she is worried.  Had her send [place] a manual transmission which shows normal lead/device function.  No concerns. Reassured patient.   She thanks me for the call and advice.  Knows she can call us back if any further questions. Reviewed upcoming wound/device check appt date. Reminded patient not to get area wet until she comes in to see [place] at device clinic appt.  She verbalizes understanding.

## 2023-05-16 NOTE — Telephone Encounter (Signed)
  Per answering service:  Patient had pacemaker put in on 05/14/23. She woke up in the middle of the night last night with her arm above her head. She did not sleep in the sling and she is concerned. Please advise.

## 2023-05-16 NOTE — Telephone Encounter (Signed)
Please refer to other phone note. This has been addressed. Thanks.

## 2023-05-19 ENCOUNTER — Ambulatory Visit (INDEPENDENT_AMBULATORY_CARE_PROVIDER_SITE_OTHER): Payer: 59

## 2023-05-21 DIAGNOSIS — Z1389 Encounter for screening for other disorder: Secondary | ICD-10-CM | POA: Diagnosis not present

## 2023-05-21 DIAGNOSIS — I442 Atrioventricular block, complete: Secondary | ICD-10-CM | POA: Diagnosis not present

## 2023-05-21 DIAGNOSIS — Z0000 Encounter for general adult medical examination without abnormal findings: Secondary | ICD-10-CM | POA: Diagnosis not present

## 2023-05-21 DIAGNOSIS — D649 Anemia, unspecified: Secondary | ICD-10-CM | POA: Diagnosis not present

## 2023-05-21 DIAGNOSIS — I10 Essential (primary) hypertension: Secondary | ICD-10-CM | POA: Diagnosis not present

## 2023-05-21 DIAGNOSIS — Z950 Presence of cardiac pacemaker: Secondary | ICD-10-CM | POA: Diagnosis not present

## 2023-05-22 ENCOUNTER — Telehealth: Payer: Self-pay | Admitting: Cardiology

## 2023-05-22 NOTE — Telephone Encounter (Signed)
Patient wants a provider switch from Dr. Jacinto Halim to Dr. Royann Shivers.

## 2023-05-22 NOTE — Telephone Encounter (Signed)
No objections

## 2023-05-22 NOTE — Telephone Encounter (Signed)
I am fine with the switch.

## 2023-05-23 ENCOUNTER — Other Ambulatory Visit: Payer: Self-pay

## 2023-05-23 DIAGNOSIS — D649 Anemia, unspecified: Secondary | ICD-10-CM | POA: Diagnosis not present

## 2023-05-23 NOTE — Progress Notes (Signed)
error

## 2023-05-28 ENCOUNTER — Other Ambulatory Visit: Payer: Self-pay

## 2023-05-28 ENCOUNTER — Ambulatory Visit: Payer: Medicare Other | Attending: Internal Medicine

## 2023-05-28 DIAGNOSIS — N3281 Overactive bladder: Secondary | ICD-10-CM

## 2023-05-28 DIAGNOSIS — I442 Atrioventricular block, complete: Secondary | ICD-10-CM | POA: Diagnosis not present

## 2023-05-28 LAB — CUP PACEART INCLINIC DEVICE CHECK
Battery Remaining Longevity: 123 mo
Battery Voltage: 3.04 V
Brady Statistic RA Percent Paced: 0.05 %
Brady Statistic RV Percent Paced: 99.53 %
Date Time Interrogation Session: 20250129200536
Implantable Lead Connection Status: 753985
Implantable Lead Connection Status: 753985
Implantable Lead Implant Date: 20131218
Implantable Lead Implant Date: 20250115
Implantable Lead Location: 753859
Implantable Lead Location: 753860
Implantable Lead Model: 1944
Implantable Pulse Generator Implant Date: 20250115
Lead Channel Impedance Value: 437.5 Ohm
Lead Channel Impedance Value: 450 Ohm
Lead Channel Pacing Threshold Amplitude: 0.5 V
Lead Channel Pacing Threshold Amplitude: 0.5 V
Lead Channel Pacing Threshold Amplitude: 0.5 V
Lead Channel Pacing Threshold Amplitude: 0.5 V
Lead Channel Pacing Threshold Pulse Width: 0.5 ms
Lead Channel Pacing Threshold Pulse Width: 0.5 ms
Lead Channel Pacing Threshold Pulse Width: 0.5 ms
Lead Channel Pacing Threshold Pulse Width: 0.5 ms
Lead Channel Sensing Intrinsic Amplitude: 3 mV
Lead Channel Sensing Intrinsic Amplitude: 5.6 mV
Lead Channel Setting Pacing Amplitude: 0.75 V
Lead Channel Setting Pacing Amplitude: 2 V
Lead Channel Setting Pacing Pulse Width: 0.5 ms
Lead Channel Setting Sensing Sensitivity: 4 mV
Pulse Gen Model: 2272
Pulse Gen Serial Number: 8233198

## 2023-05-28 MED ORDER — GEMTESA 75 MG PO TABS: 1.0000 | ORAL_TABLET | Freq: Every day | ORAL | 3 refills | Status: DC

## 2023-05-28 NOTE — Progress Notes (Signed)
Normal Pacemaker wound check. Wound well healed. Thresholds, sensing, and impedances consistent with implant measurements and at 3.5V safety margin/auto capture until 3 month visit. 1 AMS episode - AT 8 secs no EGM to review. Reviewed arm restrictions to continue for 6 weeks total post op.  Pt enrolled in remote follow-up.

## 2023-05-28 NOTE — Patient Instructions (Signed)
   After Your Pacemaker   Monitor your pacemaker site for redness, swelling, and drainage. Call the device clinic at [phone] if you experience these symptoms or fever/chills.  Your incision was closed with Steri-strips or staples:  You may shower 7 days after your procedure and wash your incision with soap and water. Avoid lotions, ointments, or perfumes over your incision until it is well-healed.  You may use a hot tub or a pool after your wound check appointment if the incision is completely closed.  Do not lift, push or pull greater than 10 pounds with the affected arm until 6 weeks after your procedure. UNTIL AFTER FEBRUARY 26TH. There are no other restrictions in arm movement after your wound check appointment.  You may drive, unless driving has been restricted by your healthcare providers.  Remote monitoring is used to monitor your pacemaker from home. This monitoring is scheduled every 91 days by our office. It allows [place] to keep an eye on the functioning of your device to ensure it is working properly. You will routinely see your Electrophysiologist annually (more often if necessary).

## 2023-05-29 ENCOUNTER — Ambulatory Visit: Payer: Medicare Other | Admitting: Physical Therapy

## 2023-05-29 DIAGNOSIS — R55 Syncope and collapse: Secondary | ICD-10-CM

## 2023-05-29 NOTE — Therapy (Signed)
[facility] [address] [place], [place], [address] Phone: [phone]   Fax:  [phone]  Patient Details  Name: Charlene Barker MRN: [id number] Date of Birth: 1946-03-10 Referring Provider:  Cathren Harsh, MD  Encounter Date: 05/29/2023  Session was arrive no charge. Since patient referred to PT, patient is being worked up for suspected GI bleed due to blood in stool and recently had to have pacemaker fixed. Patient thought she was coming for arm work after her pacemaker placement not dizziness. Given ongoing medical workup, physical therapist advises hold PT evaluation at this time until patient receives iron transfusion waiting to be scheduled and has further medical testing completed. If balance is ongoing concern once these areas further invested, recommend new referral for PT for balance deficits.   Carmelia Bake, PT, DPT 05/29/2023, 1:05 PM  [facility] [address] [place], [place], [address] Phone: [phone]   Fax:  [phone]

## 2023-06-03 ENCOUNTER — Telehealth: Payer: Self-pay | Admitting: Pharmacy Technician

## 2023-06-03 ENCOUNTER — Other Ambulatory Visit: Payer: Self-pay

## 2023-06-03 DIAGNOSIS — D500 Iron deficiency anemia secondary to blood loss (chronic): Secondary | ICD-10-CM | POA: Insufficient documentation

## 2023-06-03 NOTE — Telephone Encounter (Signed)
 Auth Submission: NO AUTH NEEDED Site of care: Site of care: [facility] Payer: MEDICARE A/B Medication & CPT/J Code(s) submitted: Feraheme (ferumoxytol ) [id number] Route of submission (phone, fax, portal):  Phone # Fax # Auth type: Buy/Bill PB Units/visits requested: 2 DOSES Reference number:  Approval from: 06/03/23 to 10/31/23

## 2023-06-04 ENCOUNTER — Telehealth: Payer: Self-pay

## 2023-06-04 NOTE — Telephone Encounter (Signed)
 The pt states she washed her hair a couple of weeks ago and she is not sure if she pulled her leads or not. Her arm has been hurting.   She also wants to know what kind of Steroid they used at the hospital?   She is going to have a procedure with her GI and the doctor states an [employer] rep may need to be there. She wanted to know if that would be possible? I told her the doctor office can call [employer] and a rep will come to interrogate the device.  Randall reviewed the pt transmission and everything looks good. Randall looked up the steroid and gave the pt the dosage.

## 2023-06-05 ENCOUNTER — Ambulatory Visit: Payer: Medicare Other

## 2023-06-05 ENCOUNTER — Other Ambulatory Visit: Payer: Self-pay | Admitting: Gastroenterology

## 2023-06-05 ENCOUNTER — Telehealth: Payer: Self-pay

## 2023-06-05 VITALS — BP 108/57 | HR 73 | Temp 97.7°F | Resp 16 | Ht 64.0 in | Wt 124.8 lb

## 2023-06-05 DIAGNOSIS — K921 Melena: Secondary | ICD-10-CM | POA: Diagnosis not present

## 2023-06-05 DIAGNOSIS — D500 Iron deficiency anemia secondary to blood loss (chronic): Secondary | ICD-10-CM

## 2023-06-05 DIAGNOSIS — K5900 Constipation, unspecified: Secondary | ICD-10-CM | POA: Diagnosis not present

## 2023-06-05 DIAGNOSIS — Z1211 Encounter for screening for malignant neoplasm of colon: Secondary | ICD-10-CM | POA: Diagnosis not present

## 2023-06-05 DIAGNOSIS — D509 Iron deficiency anemia, unspecified: Secondary | ICD-10-CM | POA: Diagnosis not present

## 2023-06-05 MED ORDER — DIPHENHYDRAMINE HCL 25 MG PO CAPS
25.0000 mg | ORAL_CAPSULE | Freq: Once | ORAL | Status: AC
  Administered 2023-06-05: 25 mg via ORAL
  Filled 2023-06-05: qty 1

## 2023-06-05 MED ORDER — ACETAMINOPHEN 325 MG PO TABS
650.0000 mg | ORAL_TABLET | Freq: Once | ORAL | Status: AC
  Administered 2023-06-05: 650 mg via ORAL
  Filled 2023-06-05: qty 2

## 2023-06-05 MED ORDER — SODIUM CHLORIDE 0.9 % IV SOLN
510.0000 mg | Freq: Once | INTRAVENOUS | Status: AC
  Administered 2023-06-05: 510 mg via INTRAVENOUS
  Filled 2023-06-05: qty 17

## 2023-06-05 NOTE — Progress Notes (Signed)
 Diagnosis: Iron Deficiency Anemia  Provider:  Praveen Mannam MD  Procedure: IV Infusion  IV Type: Peripheral, IV Location: R Forearm  Feraheme (Ferumoxytol ), Dose: 510 mg  Infusion Start Time: 1545  Infusion Stop Time: 1601  Post Infusion IV Care: Observation period completed and Peripheral IV Discontinued  Discharge: Condition: Good, Destination: Home . AVS Provided  Performed by:  Leita FORBES Miles, LPN

## 2023-06-05 NOTE — Telephone Encounter (Signed)
   Pre-operative Risk Assessment    Patient Name: Charlene Barker  DOB: 12-28-1945 MRN: [id number]   Date of last office visit: 07/08/22 Date of next office visit: Not scheduled   Request for Surgical Clearance    Procedure:   EGD and Colonoscopy  Date of Surgery:  Clearance 07/25/23                                Surgeon:  Not scheduled Surgeon's Group or Practice Name:  [facility], [place] Phone number:  [phone] Fax number:  [phone]   Type of Clearance Requested:   - Medical    Type of Anesthesia:  Not Indicated   Additional requests/questions:    Bonney Ival LOISE Gerome   06/05/2023, 4:12 PM

## 2023-06-06 NOTE — Telephone Encounter (Signed)
   Name: Charlene Barker  DOB: 1946-04-28  MRN: [id number]  Primary Cardiologist: None   Preoperative team, please contact this patient and set up a phone call appointment for further preoperative risk assessment. Please obtain consent and complete medication review. Thank you for your help.  I confirm that guidance regarding antiplatelet and oral anticoagulation therapy has been completed and, if necessary, noted below.  None requested    I also confirmed the patient resides in the state of [place] . As per [facility] Medical Board telemedicine laws, the patient must reside in the state in which the provider is licensed.   [name], NP 06/06/2023, 1:20 PM [facility]

## 2023-06-06 NOTE — Telephone Encounter (Signed)
 Pt has appt 07/16/23 with Dr. Alvis Ba and procedure is 07/25/23. Will confirm with preop APP if wants to defer clearance to 07/16/23  with Dr. Alvis Ba.

## 2023-06-06 NOTE — Telephone Encounter (Signed)
 D/w preop APP Lawana Pray, FNP ok to defer clearance to Dr. Alvis Ba appt 07/16/23.   I will update all parties involved.

## 2023-06-12 ENCOUNTER — Ambulatory Visit: Payer: Medicare Other

## 2023-06-12 VITALS — BP 122/54 | HR 73 | Temp 97.5°F | Resp 16 | Ht 64.0 in | Wt 125.0 lb

## 2023-06-12 DIAGNOSIS — D500 Iron deficiency anemia secondary to blood loss (chronic): Secondary | ICD-10-CM

## 2023-06-12 DIAGNOSIS — D509 Iron deficiency anemia, unspecified: Secondary | ICD-10-CM | POA: Diagnosis not present

## 2023-06-12 MED ORDER — DIPHENHYDRAMINE HCL 25 MG PO CAPS
25.0000 mg | ORAL_CAPSULE | Freq: Once | ORAL | Status: AC
  Administered 2023-06-12: 25 mg via ORAL
  Filled 2023-06-12: qty 1

## 2023-06-12 MED ORDER — SODIUM CHLORIDE 0.9 % IV SOLN
510.0000 mg | Freq: Once | INTRAVENOUS | Status: AC
  Administered 2023-06-12: 510 mg via INTRAVENOUS
  Filled 2023-06-12: qty 17

## 2023-06-12 MED ORDER — ACETAMINOPHEN 325 MG PO TABS
650.0000 mg | ORAL_TABLET | Freq: Once | ORAL | Status: AC
  Administered 2023-06-12: 650 mg via ORAL
  Filled 2023-06-12: qty 2

## 2023-06-12 NOTE — Progress Notes (Signed)
Diagnosis: Iron Deficiency Anemia  Provider:  Chilton Greathouse MD  Procedure: IV Infusion  IV Type: Peripheral, IV Location: R Forearm  Feraheme (Ferumoxytol), Dose: 510 mg  Infusion Start Time: 1235  Infusion Stop Time: 1255  Post Infusion IV Care: Observation period completed and Peripheral IV Discontinued  Discharge: Condition: Good, Destination: Home . AVS Declined  Performed by:  Loney Hering, LPN

## 2023-06-21 ENCOUNTER — Telehealth: Payer: Self-pay | Admitting: Student

## 2023-06-21 NOTE — Telephone Encounter (Addendum)
   Patient called Answering Service with concerns about pacemaker. Called and spoke with patient. She states she briefly rolled over on her left arm, which is the side that her pacemaker is on, this morning and just wanted to make sure that this was okay. She had a lead revision and generator change on 05/14/2023. She is now 5.5 weeks out from this so I think this is probably fine. She does states that she has had some shaking in her left arm since procedure. Although she had some of this prior to procedure as well and is not sure if it could be due to prior fracture in her left shoulder. It does not sound like anything urgently needs to be done over the weekend. I will send the device clinic a message as a FYI and advised patient that she can call back on Monday if she is still concerned or having any new issues. Also recommend discussing her arm shaking with her PCP given she was having the symptoms prior to EP procedure.   Corrin Parker, PA-C 06/21/2023 9:27 AM

## 2023-06-23 NOTE — Telephone Encounter (Signed)
 Called patient to advise her remote transmission looks fine. Patient appreciative of call.

## 2023-06-23 NOTE — Telephone Encounter (Signed)
 I got a transmission with her home remote monitor. I told her the nurse will review it and give her a call back.

## 2023-06-23 NOTE — Telephone Encounter (Signed)
 Patient advised to follow up with PCP and intermittent neck pain / left arm tingling on and off for several weeks. Pt voiced understanding.

## 2023-06-24 NOTE — Progress Notes (Signed)
 Remote pacemaker transmission.

## 2023-06-30 DIAGNOSIS — H16223 Keratoconjunctivitis sicca, not specified as Sjogren's, bilateral: Secondary | ICD-10-CM | POA: Diagnosis not present

## 2023-06-30 DIAGNOSIS — H2511 Age-related nuclear cataract, right eye: Secondary | ICD-10-CM | POA: Diagnosis not present

## 2023-06-30 DIAGNOSIS — H401133 Primary open-angle glaucoma, bilateral, severe stage: Secondary | ICD-10-CM | POA: Diagnosis not present

## 2023-06-30 DIAGNOSIS — H35372 Puckering of macula, left eye: Secondary | ICD-10-CM | POA: Diagnosis not present

## 2023-07-03 ENCOUNTER — Other Ambulatory Visit: Payer: Self-pay | Admitting: Cardiology

## 2023-07-03 DIAGNOSIS — I10 Essential (primary) hypertension: Secondary | ICD-10-CM

## 2023-07-04 ENCOUNTER — Telehealth (INDEPENDENT_AMBULATORY_CARE_PROVIDER_SITE_OTHER): Payer: Self-pay | Admitting: Otolaryngology

## 2023-07-04 NOTE — Telephone Encounter (Signed)
 Confirmed appt and address with patient for 07/07/2023.

## 2023-07-07 ENCOUNTER — Encounter (INDEPENDENT_AMBULATORY_CARE_PROVIDER_SITE_OTHER): Payer: Self-pay

## 2023-07-07 ENCOUNTER — Ambulatory Visit (INDEPENDENT_AMBULATORY_CARE_PROVIDER_SITE_OTHER): Payer: 59 | Admitting: Otolaryngology

## 2023-07-07 VITALS — BP 149/72 | HR 72 | Ht 64.0 in | Wt 122.5 lb

## 2023-07-07 DIAGNOSIS — H903 Sensorineural hearing loss, bilateral: Secondary | ICD-10-CM | POA: Diagnosis not present

## 2023-07-07 DIAGNOSIS — J310 Chronic rhinitis: Secondary | ICD-10-CM

## 2023-07-07 DIAGNOSIS — R42 Dizziness and giddiness: Secondary | ICD-10-CM | POA: Diagnosis not present

## 2023-07-07 DIAGNOSIS — R0981 Nasal congestion: Secondary | ICD-10-CM

## 2023-07-07 DIAGNOSIS — J343 Hypertrophy of nasal turbinates: Secondary | ICD-10-CM | POA: Diagnosis not present

## 2023-07-07 DIAGNOSIS — J342 Deviated nasal septum: Secondary | ICD-10-CM

## 2023-07-07 NOTE — Progress Notes (Unsigned)
 Patient ID: Charlene Barker, female   DOB: 1945-12-16, 78 y.o.   MRN: [id number]  CC: Chronic nasal congestion, nasal drainage, hearing loss, recurrent dizziness  HPI: The patient is a 78 year old female who presents today with multiple medical issues.  According to the patient, she has been experiencing chronic nasal congestion over the past 6 months.  She also reports frequent nasal drainage.  It should be noted that the patient cannot tolerate the use of steroid nasal spray due to her glaucoma.  In addition, she also complains of progressive hearing difficulty.  The patient was previously seen for recurrent dizziness and bilateral sensorineural hearing loss.  Her dizziness was described as a lightheaded and near syncope sensation.  She underwent vestibular rehabilitation with a physical therapist.  She was also evaluated at [facility] by an otologist.  According to the patient, her dizziness improved after her pacemaker was replaced in January 2025.  Over the past few months, she has noted progressive worsening of her hearing.  She currently denies any otalgia, otorrhea, or vertigo.  Exam: General: Communicates without difficulty, well nourished, no acute distress. Head: Normocephalic, no evidence injury, no tenderness, facial buttresses intact without stepoff. Face/sinus: No tenderness to palpation and percussion. Facial movement is normal and symmetric. Eyes: PERRL, EOMI. No scleral icterus, conjunctivae clear. Neuro: CN II exam reveals vision grossly intact.  No nystagmus at any point of gaze. Ears: Auricles well formed without lesions.  Ear canals are intact without mass or lesion.  No erythema or edema is appreciated.  The TMs are intact without fluid. Nose: External evaluation reveals normal support and skin without lesions.  Dorsum is intact.  Anterior rhinoscopy reveals congested mucosa over anterior aspect of inferior turbinates and intact septum.  No purulence noted. Oral:  Oral cavity and oropharynx  are intact, symmetric, without erythema or edema.  Mucosa is moist without lesions. Neck: Full range of motion without pain.  There is no significant lymphadenopathy.  No masses palpable.  Thyroid bed within normal limits to palpation.  Parotid glands and submandibular glands equal bilaterally without mass.  Trachea is midline. Neuro:  CN 2-12 grossly intact. Vestibular: No nystagmus at any point of gaze. Vestibular: There is no nystagmus with pneumatic pressure on either tympanic membrane or Valsalva. The cerebellar examination is unremarkable.   Procedure:  Flexible Nasal Endoscopy: Description: Risks, benefits, and alternatives of flexible endoscopy were explained to the patient.  Specific mention was made of the risk of throat numbness with difficulty swallowing, possible bleeding from the nose and mouth, and pain from the procedure.  The patient gave oral consent to proceed.  The flexible scope was inserted into the right nasal cavity.  Endoscopy of the interior nasal cavity, superior, inferior, and middle meatus was performed. The sphenoid-ethmoid recess was examined. Edematous mucosa was noted.  No polyp, mass, or lesion was appreciated. Nasal septal deviation noted. Olfactory cleft was clear.  Nasopharynx was clear.  Turbinates were hypertrophied but without mass.  The procedure was repeated on the contralateral side with similar findings.  The patient tolerated the procedure well.   Assessment: 1.  Chronic rhinitis with nasal mucosal congestion, nasal septal deviation, and bilateral inferior turbinate hypertrophy.  No polyps, mass, lesion, or purulent drainage is noted today. 2.  Subjective worsening of her bilateral high-frequency sensorineural hearing loss.  Her ear canals, tympanic membranes, and middle ear spaces are normal. 3.  Recurrent dizziness, likely secondary to cardiovascular insufficiencies.  Her dizziness has improved after her malfunctioning pacemaker was  replaced in January  2025.  Plan: 1.  The physical exam and nasal endoscopy findings are reviewed with the patient. 2.  The patient is reassured that no acute infection is noted today. 3.  Nasal saline irrigation is strongly encouraged.  The instructions on how to perform the irrigation are reviewed.  The patient cannot tolerate the use of steroid nasal sprays due to her glaucoma. 4.  Outpatient hearing test.  No audiologist is available today. 5.  The patient is a candidate for hearing amplification. 6.  The patient is encouraged to call with any questions or concerns.

## 2023-07-08 ENCOUNTER — Other Ambulatory Visit (INDEPENDENT_AMBULATORY_CARE_PROVIDER_SITE_OTHER): Payer: Self-pay | Admitting: Otolaryngology

## 2023-07-08 ENCOUNTER — Ambulatory Visit (INDEPENDENT_AMBULATORY_CARE_PROVIDER_SITE_OTHER): Admitting: Audiology

## 2023-07-08 DIAGNOSIS — J343 Hypertrophy of nasal turbinates: Secondary | ICD-10-CM | POA: Insufficient documentation

## 2023-07-08 DIAGNOSIS — H903 Sensorineural hearing loss, bilateral: Secondary | ICD-10-CM | POA: Insufficient documentation

## 2023-07-08 DIAGNOSIS — J342 Deviated nasal septum: Secondary | ICD-10-CM | POA: Insufficient documentation

## 2023-07-08 DIAGNOSIS — J310 Chronic rhinitis: Secondary | ICD-10-CM | POA: Insufficient documentation

## 2023-07-08 NOTE — Progress Notes (Unsigned)
  [address] [place], [place] [address] [phone]  Audiological Evaluation    Name: Charlene Barker     DOB:   01/14/1946      MRN:   [id number]                                                                                     Service Date: 07/08/2023     Accompanied by: unaccompanied to the booth   Patient comes today after Dr. Suszanne Conners, ENT sent a referral for a hearing evaluation due to concerns with dizziness.   Symptoms Yes Details  Hearing loss  [x]  Difficulty understanding speech  Tinnitus  [x]  sometimes  Ear pain/ infections/pressure  []    Balance problems  [x]  Unsure if related to her glaucoma ( vision problems) , lightheadedness sensation  Noise exposure history  []    Previous ear surgeries  []    Family history of hearing loss  [x]  Brother , with age  Amplification  []    Other  []  Left ear itching    Otoscopy: Right ear: Clear external ear canals and notable landmarks visualized on the tympanic membrane. Left ear:  Clear external ear canals and notable landmarks visualized on the tympanic membrane.  Tympanometry: Right ear: Type A- Normal external ear canal volume with normal middle ear pressure and tympanic membrane compliance Left ear: Type A- Normal external ear canal volume with normal middle ear pressure and tympanic membrane compliance     Pure tone Audiometry: Both ears- Normal to moderately severe sensorineural hearing loss from 250 Hz - 8000 Hz.    Speech Audiometry: Right ear- Speech Reception Threshold (SRT) was obtained at 30 dBHL. Left ear-Speech Reception Threshold (SRT) was obtained at 25 dBHL.   Word Recognition Score Tested using NU-6 (MLV) Right ear: 80% was obtained at a presentation level of 70 dBHL with contralateral masking which is deemed as  good . Left ear: 88% was obtained at a presentation level of 70 dBHL with contralateral masking which is deemed as  good .   The hearing test results were completed under  headphones and results are deemed to be of good reliability. Test technique:  conventional      Recommendations: Follow up with ENT as per MD. Return for a hearing evaluation if concerns with hearing changes arise or per MD recommendation. Consider a communication needs assessment after medical clearance for hearing aids is obtained.   Shontelle Muska MARIE LEROUX-MARTINEZ, AUD

## 2023-07-09 ENCOUNTER — Ambulatory Visit: Payer: Self-pay | Admitting: Cardiology

## 2023-07-10 ENCOUNTER — Encounter: Payer: Self-pay | Admitting: Audiology

## 2023-07-15 ENCOUNTER — Ambulatory Visit: Payer: 59 | Admitting: Cardiology

## 2023-07-15 ENCOUNTER — Ambulatory Visit (INDEPENDENT_AMBULATORY_CARE_PROVIDER_SITE_OTHER): Admitting: Audiology

## 2023-07-16 ENCOUNTER — Ambulatory Visit: Payer: 59 | Attending: Cardiovascular Disease | Admitting: Cardiovascular Disease

## 2023-07-16 ENCOUNTER — Other Ambulatory Visit: Payer: Self-pay

## 2023-07-16 ENCOUNTER — Encounter: Payer: Self-pay | Admitting: Cardiovascular Disease

## 2023-07-16 VITALS — BP 138/58 | HR 80 | Ht 64.0 in | Wt 125.4 lb

## 2023-07-16 DIAGNOSIS — I10 Essential (primary) hypertension: Secondary | ICD-10-CM | POA: Diagnosis not present

## 2023-07-16 DIAGNOSIS — Z950 Presence of cardiac pacemaker: Secondary | ICD-10-CM

## 2023-07-16 DIAGNOSIS — D500 Iron deficiency anemia secondary to blood loss (chronic): Secondary | ICD-10-CM | POA: Diagnosis not present

## 2023-07-16 DIAGNOSIS — I442 Atrioventricular block, complete: Secondary | ICD-10-CM

## 2023-07-16 NOTE — Telephone Encounter (Signed)
 Clearance letter sent to Dr. Elnoria Howard

## 2023-07-16 NOTE — Progress Notes (Signed)
 Cardiology Office Note:  .   Date:  07/16/2023  ID:  Charlene Barker, DOB June 29, 1945, MRN [id number] PCP: Rodrigo Ran, MD  [facility] Providers Cardiologist:  Thurmon Fair, MD Electrophysiologist:  Maurice Small, MD    History of Present Illness: .   Charlene Barker is a 78 y.o. female with complete heart block, pacemaker dependent essential hypertension who presents for preop cardiovascular evaluation before upper endoscopy and colonoscopy as part of her evaluation of iron deficiency anemia.    She has been experiencing difficulty taking deep breaths since the pacemaker was placed, which is gradually improving. She describes extreme pain on the left side of her body, from her neck to her waist, particularly in her back shoulder, since the pacemaker placement.  Both forearms tend to be swollen, symmetrically. No history of breast surgery or radiation therapy.  The anterior chest inferior to the pacemaker is a little tender to touch.  All the symptoms are slowly improving.    Her pacemaker was initially implanted in 2013 when she developed intermittent complete heart block, but pacing only occurred infrequently for many years. In 2021, she experienced a fall that resulted in a broken shoulder and subsequent issues with her previous pacemaker, including "noise" and spells of feeling like she was going to pass out.  At that time she was not pacemaker dependent and her episodes of heart block are only intermittent.  She began developing symptoms of near syncope and in January 2025 was found to have periods of inappropriate inhibition of ventricular pacing and had complete heart block with an escape rhythm around 35 bpm.  She underwent replacement of the right ventricular lead.  The old lead was capped and abandoned in place (therefore her new pacemaker system is not MRI compatible).   Since the new lead was implanted has not had any new episodes of near syncope or syncope. She mentions  experiencing brain fog after the surgery, which has improved but still affects her ability to recall words.   Interrogation of her pacemaker today shows that she is indeed device dependent.  She has an [employer] Assurity MRI conditional device but because of the abandoned right ventricular lead she cannot have MRIs.  Estimated gentle longevity is 10-11 years.  There is no detected R wave today.  Excellent RV lead capture threshold.  She has 100% atrial sensed (sinus) ventricular paced rhythm.  She has had a handful of very brief episodes of mode switch, the longest only 10 seconds in duration.  The new ventricular lead parameters all appear appropriate.  She has a history of low iron levels, with a recent hemoglobin of 8.7. She is scheduled for a colonoscopy and endoscopy to investigate the cause of her anemia. She has previously received iron infusions and was taking iron tablets, but is concerned about potential overdose due to lack of guidance on when to stop the tablets. She is on losartan for high blood pressure, taking a total of 75 mg daily (50 mg and 25 mg tablets). Her blood pressure is often around 140-150 systolic, and she finds it difficult to get accurate readings with her home blood pressure monitor.     Studies Reviewed: Marland Kitchen      Interrogation of the pacemaker in the office today.   Risk Assessment/Calculations:             Physical Exam:   VS:  BP (!) 138/58 (BP Location: Right Arm, Patient Position: Sitting, Cuff Size: Normal)  Pulse 80   Ht 5\' 4"  (1.626 m)   Wt 125 lb 6.4 oz (56.9 kg)   SpO2 97%   BMI 21.52 kg/m    Wt Readings from Last 3 Encounters:  07/16/23 125 lb 6.4 oz (56.9 kg)  07/07/23 122 lb 8 oz (55.6 kg)  06/12/23 125 lb (56.7 kg)    GEN: Well nourished, well developed in no acute distress NECK: No JVD; No carotid bruits CARDIAC: RRR, no murmurs, rubs, gallops RESPIRATORY:  Clear to auscultation without rales, wheezing or rhonchi  ABDOMEN: Soft, non-tender,  non-distended EXTREMITIES:  No edema; No deformity   The pacemaker site has healed very well.  There is no evidence of hematoma or ecchymosis.  She is slightly tender in the breast area, inferior to the pacemaker.  There is no redness, warmth or drainage or any other sign of infection.  ASSESSMENT AND PLAN: .    Preop CV eval: She is at low risk for major cardiovascular complications with planned endoscopy.  The endoscopic procedures should not lead to changes in normal pacemaker function. CHB: Pacemaker dependent.  Normal pacemaker function.  Has an appointment to see Dr. Nelly Laurence on April 14. HTN: Blood pressure control.  Note that she tends to run a rather low diastolic blood pressure.  No changes made to her medications. Iron deficiency anemia: Workup going ahead with upper and lower endoscopy in the near future.  Will recheck her CBC and iron studies today.  Previously was seeing Dr. Jacinto Halim as well as EP.  Since her cardiovascular issues are primarily those related to complete heart block and the pacemaker I do not think she needs to cardiologist to be following her, but we will be delighted to see her back in the office for any other cardiac related problems in the future.       Dispo: Follow-up with general cardiology as needed  Signed, Thurmon Fair, MD

## 2023-07-16 NOTE — Patient Instructions (Signed)
 Medication Instructions:  Your physician recommends that you continue on your current medications as directed. Please refer to the Current Medication list given to you today.  *If you need a refill on your cardiac medications before your next appointment, please call your pharmacy*  Lab Work: TODAY: CBC and iron panel If you have labs (blood work) drawn today and your tests are completely normal, you will receive your results only by: MyChart Message (if you have MyChart) OR A paper copy in the mail If you have any lab test that is abnormal or we need to change your treatment, we will call you to review the results.  Follow-Up: At [facility], you and your health needs are our priority.  As part of our continuing mission to provide you with exceptional heart care, we have created designated Provider Care Teams.  These Care Teams include your primary Cardiologist (physician) and Advanced Practice Providers (APPs -  Physician Assistants and Nurse Practitioners) who all work together to provide you with the care you need, when you need it.  Your next appointment:   As needed with Dr. Salena Saner     1st Floor: - Lobby - Registration  - Pharmacy  - Lab - Cafe  2nd Floor: - PV Lab - Diagnostic Testing (echo, CT, nuclear med)  3rd Floor: - Vacant  4th Floor: - TCTS (cardiothoracic surgery) - AFib Clinic - Structural Heart Clinic - Vascular Surgery  - Vascular Ultrasound  5th Floor: - [facility]Care Cardiology (general and EP) - Clinical Pharmacy for coumadin, hypertension, lipid, weight-loss medications, and med management appointments    Valet parking services will be available as well.

## 2023-07-17 LAB — IRON,TIBC AND FERRITIN PANEL
Ferritin: 438 ng/mL — ABNORMAL HIGH (ref 15–150)
Iron Saturation: 26 % (ref 15–55)
Iron: 83 ug/dL (ref 27–139)
Total Iron Binding Capacity: 324 ug/dL (ref 250–450)
UIBC: 241 ug/dL (ref 118–369)

## 2023-07-17 LAB — CBC
Hematocrit: 38.9 % (ref 34.0–46.6)
Hemoglobin: 12.9 g/dL (ref 11.1–15.9)
MCH: 30.3 pg (ref 26.6–33.0)
MCHC: 33.2 g/dL (ref 31.5–35.7)
MCV: 91 fL (ref 79–97)
Platelets: 253 10*3/uL (ref 150–450)
RBC: 4.26 x10E6/uL (ref 3.77–5.28)
RDW: 18.7 % — ABNORMAL HIGH (ref 11.7–15.4)
WBC: 8.8 10*3/uL (ref 3.4–10.8)

## 2023-07-18 ENCOUNTER — Encounter (HOSPITAL_COMMUNITY): Payer: Self-pay | Admitting: Gastroenterology

## 2023-07-21 ENCOUNTER — Telehealth: Payer: Self-pay | Admitting: Cardiovascular Disease

## 2023-07-21 DIAGNOSIS — N760 Acute vaginitis: Secondary | ICD-10-CM | POA: Diagnosis not present

## 2023-07-21 NOTE — Telephone Encounter (Signed)
 Thurmon Fair, MD 07/17/2023  9:58 AM EDT Back to Top    Blood counts are back to normal and her iron stores are replete.  She does not need any additional iron supplements.   Patient identification verified by 2 forms. Charlene Rail, RN    Called and spoke to patient  Patient states requesting lab results  RN relayed provider result message  Patient requested result letter sent to home  Patient verbalized understanding, no questions at this time

## 2023-07-21 NOTE — Telephone Encounter (Signed)
 Patient would like a call back to discuss lab results. If she is unavailable when her call is returned she would like the results on her voicemail if possible. She says the only one she's concerned with is her iron. If she doesn't answer she would like the value left on voicemail.

## 2023-07-24 NOTE — Anesthesia Preprocedure Evaluation (Signed)
 Anesthesia Evaluation  Patient identified by MRN, date of birth, ID band Patient awake    Reviewed: Allergy & Precautions, NPO status , Patient's Chart, lab work & pertinent test results  History of Anesthesia Complications (+) history of anesthetic complications (reports brain fog after pacemaker placement)  Airway Mallampati: III  TM Distance: >3 FB Neck ROM: Full  Mouth opening: Limited Mouth Opening  Dental  (+) Dental Advisory Given   Pulmonary neg pulmonary ROS   Pulmonary exam normal breath sounds clear to auscultation       Cardiovascular hypertension (losartan), Pt. on medications (-) angina (-) Past MI, (-) Cardiac Stents and (-) CABG + dysrhythmias (LBBB, complete heart block) + pacemaker  Rhythm:Regular Rate:Normal  TTE 05/14/2023: IMPRESSIONS    1. Left ventricular ejection fraction, by estimation, is 65 to 70%. The  left ventricle has normal function. The left ventricle has no regional  wall motion abnormalities. Left ventricular diastolic parameters are  consistent with Grade I diastolic  dysfunction (impaired relaxation).   2. Right ventricular systolic function is normal. The right ventricular  size is normal. There is normal pulmonary artery systolic pressure. The  estimated right ventricular systolic pressure is 22.0 mmHg.   3. The mitral valve is normal in structure. Trivial mitral valve  regurgitation.   4. The aortic valve is tricuspid. Aortic valve regurgitation is not  visualized. No aortic stenosis is present.   5. The inferior vena cava is normal in size with greater than 50%  respiratory variability, suggesting right atrial pressure of 3 mmHg.     Neuro/Psych neg Seizures PSYCHIATRIC DISORDERS Anxiety      Neuromuscular disease (lumbar spinal stenosis)    GI/Hepatic Neg liver ROS, Bowel prep,GERD  ,,  Endo/Other  diabetes, Type 2    Renal/GU negative Renal ROS     Musculoskeletal  (+)  Arthritis , Rheumatoid disorders,    Abdominal   Peds  Hematology  (+) Blood dyscrasia, anemia Lab Results      Component                Value               Date                      WBC                      8.8                 07/16/2023                HGB                      12.9                07/16/2023                HCT                      38.9                07/16/2023                MCV                      91                  07/16/2023  PLT                      253                 07/16/2023              Anesthesia Other Findings   Reproductive/Obstetrics                             Anesthesia Physical Anesthesia Plan  ASA: 3  Anesthesia Plan: MAC   Post-op Pain Management: Minimal or no pain anticipated   Induction: Intravenous  PONV Risk Score and Plan: 2 and Propofol infusion, TIVA and Treatment may vary due to age or medical condition  Airway Management Planned: Natural Airway and Nasal Cannula  Additional Equipment:   Intra-op Plan:   Post-operative Plan:   Informed Consent: I have reviewed the patients History and Physical, chart, labs and discussed the procedure including the risks, benefits and alternatives for the proposed anesthesia with the patient or authorized representative who has indicated his/her understanding and acceptance.     Dental advisory given  Plan Discussed with: CRNA and Anesthesiologist  Anesthesia Plan Comments: (Discussed with patient risks of MAC including, but not limited to, minor pain or discomfort, hearing people in the room, and possible need for backup general anesthesia. Risks for general anesthesia also discussed including, but not limited to, sore throat, hoarse voice, chipped/damaged teeth, injury to vocal cords, nausea and vomiting, allergic reactions, lung infection, heart attack, stroke, and death. All questions answered. )        Anesthesia Quick Evaluation

## 2023-07-25 ENCOUNTER — Ambulatory Visit (HOSPITAL_COMMUNITY)
Admission: RE | Admit: 2023-07-25 | Discharge: 2023-07-25 | Disposition: A | Discharge status: Home / Self Care | Payer: Medicare Other | Attending: Gastroenterology | Admitting: Gastroenterology

## 2023-07-25 ENCOUNTER — Other Ambulatory Visit: Payer: Self-pay

## 2023-07-25 ENCOUNTER — Ambulatory Visit (HOSPITAL_COMMUNITY): Payer: Self-pay | Admitting: Certified Registered Nurse Anesthetist

## 2023-07-25 ENCOUNTER — Encounter (HOSPITAL_COMMUNITY): Payer: Self-pay | Admitting: Gastroenterology

## 2023-07-25 ENCOUNTER — Encounter (HOSPITAL_COMMUNITY)
Admission: RE | Disposition: A | Discharge status: Home / Self Care | Payer: Self-pay | Source: Home / Self Care | Attending: Gastroenterology

## 2023-07-25 DIAGNOSIS — D123 Benign neoplasm of transverse colon: Secondary | ICD-10-CM | POA: Diagnosis not present

## 2023-07-25 DIAGNOSIS — R195 Other fecal abnormalities: Secondary | ICD-10-CM | POA: Diagnosis not present

## 2023-07-25 DIAGNOSIS — D509 Iron deficiency anemia, unspecified: Secondary | ICD-10-CM | POA: Insufficient documentation

## 2023-07-25 DIAGNOSIS — D120 Benign neoplasm of cecum: Secondary | ICD-10-CM | POA: Diagnosis not present

## 2023-07-25 DIAGNOSIS — E119 Type 2 diabetes mellitus without complications: Secondary | ICD-10-CM | POA: Insufficient documentation

## 2023-07-25 DIAGNOSIS — Z1211 Encounter for screening for malignant neoplasm of colon: Secondary | ICD-10-CM | POA: Diagnosis not present

## 2023-07-25 DIAGNOSIS — D124 Benign neoplasm of descending colon: Secondary | ICD-10-CM

## 2023-07-25 DIAGNOSIS — K219 Gastro-esophageal reflux disease without esophagitis: Secondary | ICD-10-CM | POA: Insufficient documentation

## 2023-07-25 DIAGNOSIS — D649 Anemia, unspecified: Secondary | ICD-10-CM | POA: Diagnosis not present

## 2023-07-25 DIAGNOSIS — I10 Essential (primary) hypertension: Secondary | ICD-10-CM | POA: Insufficient documentation

## 2023-07-25 DIAGNOSIS — Z950 Presence of cardiac pacemaker: Secondary | ICD-10-CM | POA: Insufficient documentation

## 2023-07-25 DIAGNOSIS — M1990 Unspecified osteoarthritis, unspecified site: Secondary | ICD-10-CM | POA: Insufficient documentation

## 2023-07-25 DIAGNOSIS — K5520 Angiodysplasia of colon without hemorrhage: Secondary | ICD-10-CM | POA: Diagnosis not present

## 2023-07-25 DIAGNOSIS — K635 Polyp of colon: Secondary | ICD-10-CM | POA: Diagnosis not present

## 2023-07-25 HISTORY — PX: ESOPHAGOGASTRODUODENOSCOPY (EGD) WITH PROPOFOL: SHX5813

## 2023-07-25 HISTORY — PX: COLONOSCOPY WITH PROPOFOL: SHX5780

## 2023-07-25 HISTORY — PX: POLYPECTOMY: SHX5525

## 2023-07-25 HISTORY — PX: HOT HEMOSTASIS: SHX5433

## 2023-07-25 HISTORY — PX: BIOPSY OF SKIN SUBCUTANEOUS TISSUE AND/OR MUCOUS MEMBRANE: SHX6741

## 2023-07-25 LAB — GLUCOSE, CAPILLARY: Glucose-Capillary: 123 mg/dL — ABNORMAL HIGH (ref 70–99)

## 2023-07-25 SURGERY — ESOPHAGOGASTRODUODENOSCOPY (EGD) WITH PROPOFOL
Anesthesia: Monitor Anesthesia Care

## 2023-07-25 MED ORDER — PROPOFOL 500 MG/50ML IV EMUL
INTRAVENOUS | Status: DC | PRN
  Administered 2023-07-25: 125 ug/kg/min via INTRAVENOUS

## 2023-07-25 MED ORDER — LIDOCAINE 2% (20 MG/ML) 5 ML SYRINGE
INTRAMUSCULAR | Status: DC | PRN
  Administered 2023-07-25: 80 mg via INTRAVENOUS

## 2023-07-25 MED ORDER — PROPOFOL 1000 MG/100ML IV EMUL
INTRAVENOUS | Status: AC
  Filled 2023-07-25: qty 100

## 2023-07-25 MED ORDER — PROPOFOL 10 MG/ML IV BOLUS
INTRAVENOUS | Status: DC | PRN
  Administered 2023-07-25: 30 mg via INTRAVENOUS

## 2023-07-25 MED ORDER — SODIUM CHLORIDE 0.9 % IV SOLN: INTRAVENOUS | Status: DC | PRN

## 2023-07-25 SURGICAL SUPPLY — 23 items
BLOCK BITE 60FR ADLT L/F BLUE (MISCELLANEOUS) ×2 IMPLANT
ELECT REM PT RETURN 9FT ADLT (ELECTROSURGICAL) IMPLANT
ELECTRODE REM PT RTRN 9FT ADLT (ELECTROSURGICAL) IMPLANT
FLOOR PAD 36X40 (MISCELLANEOUS) ×2 IMPLANT
FORCEP RJ3 GP 1.8X160 W-NEEDLE (CUTTING FORCEPS) IMPLANT
FORCEPS BIOP RAD 4 LRG CAP 4 (CUTTING FORCEPS) IMPLANT
FORCEPS BIOP RJ4 240 W/NDL (CUTTING FORCEPS) IMPLANT
FORCEPS BXJMBJMB 240X2.8X (CUTTING FORCEPS) IMPLANT
INJECTOR/SNARE I SNARE (MISCELLANEOUS) IMPLANT
LUBRICANT JELLY 4.5OZ STERILE (MISCELLANEOUS) IMPLANT
MANIFOLD NEPTUNE II (INSTRUMENTS) IMPLANT
NDL SCLEROTHERAPY 25GX240 (NEEDLE) IMPLANT
NEEDLE SCLEROTHERAPY 25GX240 (NEEDLE) IMPLANT
PAD FLOOR 36X40 (MISCELLANEOUS) ×2 IMPLANT
PROBE APC STR FIRE (PROBE) IMPLANT
PROBE INJECTION GOLD 7FR (MISCELLANEOUS) IMPLANT
SNARE ROTATE MED OVAL 20MM (MISCELLANEOUS) IMPLANT
SNARE SHORT THROW 13M SML OVAL (MISCELLANEOUS) IMPLANT
SYR 50ML LL SCALE MARK (SYRINGE) IMPLANT
TRAP SPECIMEN MUCOUS 40CC (MISCELLANEOUS) IMPLANT
TUBING ENDO SMARTCAP PENTAX (MISCELLANEOUS) ×4 IMPLANT
TUBING IRRIGATION ENDOGATOR (MISCELLANEOUS) ×2 IMPLANT
WATER STERILE IRR 1000ML POUR (IV SOLUTION) IMPLANT

## 2023-07-25 NOTE — Op Note (Addendum)
 [facility] Patient Name: Charlene Barker Procedure Date: 07/25/2023 MRN: [id number] Attending MD: Jeani Hawking , MD, [id number] Date of Birth: Jan 06, 1946 CSN: [id number] Age: 78 Admit Type: Ambulatory Procedure:                Colonoscopy Indications:              Heme positive stool Providers:                Jeani Hawking, MD, Jacquelyn "Jaci" Clelia Croft, RN, Marja Kays, Technician Referring MD:              Medicines:                Propofol per Anesthesia Complications:            No immediate complications. Estimated Blood Loss:     Estimated blood loss: none. Procedure:                Pre-Anesthesia Assessment:                           - Prior to the procedure, a History and Physical                            was performed, and patient medications and                            allergies were reviewed. The patient's tolerance of                            previous anesthesia was also reviewed. The risks                            and benefits of the procedure and the sedation                            options and risks were discussed with the patient.                            All questions were answered, and informed consent                            was obtained. Prior Anticoagulants: The patient has                            taken no anticoagulant or antiplatelet agents. ASA                            Grade Assessment: III - A patient with severe                            systemic disease. After reviewing the risks and  benefits, the patient was deemed in satisfactory                            condition to undergo the procedure.                           - Sedation was administered by an anesthesia                            professional. Deep sedation was attained.                           After obtaining informed consent, the colonoscope                            was passed under direct vision.  Throughout the                            procedure, the patient's blood pressure, pulse, and                            oxygen saturations were monitored continuously. The                            PCF-HQ190L ([id number] Olympus colonoscope was                            introduced through the anus and advanced to the the                            cecum, identified by appendiceal orifice and                            ileocecal valve. The colonoscopy was performed                            without difficulty. The patient tolerated the                            procedure well. The quality of the bowel                            preparation was evaluated using the BBPS [facility]                            Bowel Preparation Scale) with scores of: Right                            Colon = 3 (entire mucosa seen well with no residual                            staining, small fragments of stool or opaque  liquid), Transverse Colon = 2 (minor amount of                            residual staining, small fragments of stool and/or                            opaque liquid, but mucosa seen well) and Left Colon                            = 3 (entire mucosa seen well with no residual                            staining, small fragments of stool or opaque                            liquid). The total BBPS score equals 8. The quality                            of the bowel preparation was good. The ileocecal                            valve, appendiceal orifice, and rectum were                            photographed. Scope In: 8:28:11 AM Scope Out: 8:54:13 AM Scope Withdrawal Time: 0 hours 17 minutes 50 seconds  Total Procedure Duration: 0 hours 26 minutes 2 seconds  Findings:      Three sessile polyps were found in the descending colon, transverse       colon and cecum. The polyps were 5 to 10 mm in size. These polyps were       removed with a cold snare. Resection and  retrieval were complete.      Six medium-sized patchy angiodysplastic lesions without bleeding were       found in the ascending colon. Coagulation for tissue destruction using       monopolar probe was successful. Estimated blood loss: none. Impression:               - Three 5 to 10 mm polyps in the descending colon,                            in the transverse colon and in the cecum, removed                            with a cold snare. Resected and retrieved.                           - Six non-bleeding colonic angiodysplastic lesions.                            Treated with a monopolar probe. Moderate Sedation:      Not Applicable - Patient had care per Anesthesia. Recommendation:           - Patient has a contact number available for  emergencies. The signs and symptoms of potential                            delayed complications were discussed with the                            patient. Return to normal activities tomorrow.                            Written discharge instructions were provided to the                            patient.                           - Resume previous diet.                           - Continue present medications.                           - Await pathology results.                           - Repeat colonoscopy in 3 years for surveillance,                            if clinically appropriate.                           - Follow up with Dr. Loreta Ave in one month. Procedure Code(s):        --- Professional ---                           [id number], Colonoscopy, flexible; with ablation of                            tumor(s), polyp(s), or other lesion(s) (includes                            pre- and post-dilation and guide wire passage, when                            performed)                           45385, 59, Colonoscopy, flexible; with removal of                            tumor(s), polyp(s), or other lesion(s) by snare                             technique Diagnosis Code(s):        --- Professional ---                           D12.4, Benign  neoplasm of descending colon                           D12.3, Benign neoplasm of transverse colon (hepatic                            flexure or splenic flexure)                           D12.0, Benign neoplasm of cecum                           K55.20, Angiodysplasia of colon without hemorrhage                           R19.5, Other fecal abnormalities CPT copyright 2022 American Medical Association. All rights reserved. The codes documented in this report are preliminary and upon coder review may  be revised to meet current compliance requirements. Jeani Hawking, MD Jeani Hawking, MD 07/25/2023 9:01:47 AM This report has been signed electronically. Number of Addenda: 0

## 2023-07-25 NOTE — Transfer of Care (Signed)
 Immediate Anesthesia Transfer of Care Note  Patient: Charlene Barker  Procedure(s) Performed: ESOPHAGOGASTRODUODENOSCOPY (EGD) WITH PROPOFOL COLONOSCOPY WITH PROPOFOL BIOPSY, gastric POLYPECTOMY , WITH ARGON PLASMA COAGULATION  Patient Location: Endoscopy Unit  Anesthesia Type:MAC  Level of Consciousness: drowsy  Airway & Oxygen Therapy: Patient Spontanous Breathing and Patient connected to nasal cannula oxygen  Post-op Assessment: Report given to RN and Post -op Vital signs reviewed and stable  Post vital signs: Reviewed and stable  Last Vitals:  Vitals Value Taken Time  BP    Temp    Pulse 81 07/25/23 0859  Resp 14 07/25/23 0859  SpO2 100 % 07/25/23 0859  Vitals shown include unfiled device data.  Last Pain:  Vitals:   07/25/23 0750  TempSrc: Temporal  PainSc: 0-No pain         Complications: No notable events documented.

## 2023-07-25 NOTE — Op Note (Signed)
 [facility] Patient Name: Charlene Barker Procedure Date: 07/25/2023 MRN: [id number] Attending MD: Jeani Hawking , MD, [id number] Date of Birth: 09-30-1945 CSN: [id number] Age: 78 Admit Type: Ambulatory Procedure:                Upper GI endoscopy Indications:              Iron deficiency anemia Providers:                Jeani Hawking, MD, Jacquelyn "Jaci" Clelia Croft, RN, Marja Kays, Technician Referring MD:              Medicines:                Propofol per Anesthesia Complications:            No immediate complications. Estimated Blood Loss:     Estimated blood loss: none. Procedure:                Pre-Anesthesia Assessment:                           - Sedation was administered by an anesthesia                            professional. Deep sedation was attained.                           - Prior to the procedure, a History and Physical                            was performed, and patient medications and                            allergies were reviewed. The patient's tolerance of                            previous anesthesia was also reviewed. The risks                            and benefits of the procedure and the sedation                            options and risks were discussed with the patient.                            All questions were answered, and informed consent                            was obtained. Prior Anticoagulants: The patient has                            taken no anticoagulant or antiplatelet agents. ASA                            Grade Assessment:  III - A patient with severe                            systemic disease. After reviewing the risks and                            benefits, the patient was deemed in satisfactory                            condition to undergo the procedure.                           After obtaining informed consent, the endoscope was                            passed under direct  vision. Throughout the                            procedure, the patient's blood pressure, pulse, and                            oxygen saturations were monitored continuously. The                            PCF-HQ190L ([id number]) Olympus colonoscope was                            introduced through the mouth, and advanced to the                            second part of duodenum. The upper GI endoscopy was                            accomplished without difficulty. The patient                            tolerated the procedure well. Scope In: Scope Out: Findings:      The esophagus was normal.      The entire examined stomach was normal. Biopsies were taken with a cold       forceps for Helicobacter pylori testing.      The examined duodenum was normal. Impression:               - Normal esophagus.                           - Normal stomach. Biopsied.                           - Normal examined duodenum. Moderate Sedation:      Not Applicable - Patient had care per Anesthesia. Recommendation:           - Patient has a contact number available for                            emergencies. The signs  and symptoms of potential                            delayed complications were discussed with the                            patient. Return to normal activities tomorrow.                            Written discharge instructions were provided to the                            patient.                           - Resume previous diet.                           - Continue present medications.                           - Await pathology results. Procedure Code(s):        --- Professional ---                           [phone], Esophagogastroduodenoscopy, flexible,                            transoral; with biopsy, single or multiple Diagnosis Code(s):        --- Professional ---                           D50.9, Iron deficiency anemia, unspecified CPT copyright 2022 American Medical Association.  All rights reserved. The codes documented in this report are preliminary and upon coder review may  be revised to meet current compliance requirements. Jeani Hawking, MD Jeani Hawking, MD 07/25/2023 9:03:50 AM This report has been signed electronically. Number of Addenda: 0

## 2023-07-25 NOTE — Progress Notes (Signed)
 Left message on pt voicemail to see if she is planning to come for her procedure today.

## 2023-07-25 NOTE — H&P (Signed)
 Charlene Barker HPI: This 78 year old white female presents to the office for further evaluation of iron defeciency anemia and guaiac positive stools. She was hospitalized on January 15 this year, when her cardiac pacemaker was replaced. She had CBC done on 05/21/2023  that revealed a hemoglobin of 8.4 gm/dl with an iron saturation of 8 and Ferritin of 8.  Of note her hemoglobin was 11.5 g/dL in September last year.  Her stools were noted to be guaiac positive that day as well. She denies having any melena hematochezia. She has 1-2 BM's per day with no obvious blood or mucus in the stool. She has a good appetite and her weight has been stable. She denies having any complaints of abdominal pain, nausea, vomiting, acid reflux, dysphagia or odynophagia. She denies having a family history of colon cancer, celiac sprue or IBD. She had a normal colonosocpy done 02/22/2011.  Past Medical History:  Diagnosis Date   Anxiety    Atrioventricular block, complete -intermittent        Complete heart block (HCC) 04/14/2012   Diabetes mellitus without complication (HCC)    per patient.    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   Pacemaker    St Jude   Paresthesia of both legs 03/02/2015   Rheumatoid arthritis (HCC)    Sinus node dysfunction (HCC) 02/17/2019   Spinal stenosis of lumbar region 03/02/2015   L4-5   Syncope     Past Surgical History:  Procedure Laterality Date   BLADDER SURGERY     sling   EYE SURGERY     several, bilateral   LEAD REVISION/REPAIR N/A 05/14/2023   Procedure: LEAD REVISION/REPAIR;  Surgeon: Mealor, Roberts Gaudy, MD;  Location: [facility] INVASIVE CV LAB;  Service: Cardiovascular;  Laterality: N/A;   LOOP RECORDER IMPLANT  N/A 09/20/2011   Procedure: LOOP RECORDER IMPLANT;  Surgeon: Duke Salvia, MD;  Location: [facility] CATH LAB;  Service: Cardiovascular;  Laterality: N/A;   PERMANENT PACEMAKER INSERTION N/A 04/15/2012   PPM GENERATOR CHANGEOUT N/A 05/14/2023   Procedure: PPM GENERATOR CHANGEOUT;  Surgeon: Maurice Small, MD;  Location: [facility] INVASIVE CV LAB;  Service: Cardiovascular;  Laterality: N/A;    Family History  Problem Relation Age of Onset   Leukemia Mother    Stroke Father    Heart failure Father    Heart attack Father    Heart disease Father    Diabetes Sister    Asthma Brother    Restless legs syndrome Brother     Social History:  reports that she has never smoked. She has never used smokeless tobacco. She reports that she does not currently use alcohol. She reports that she does not use drugs.  Allergies:  Allergies  Allergen Reactions   Cipro [Ciprofloxacin Hcl] Hives   Cleocin [Clindamycin] Itching   Codeine Other (See Comments)    Dry mouth Felt "weird"   Contrast Media [Iodinated Contrast Media] Other (See Comments)    Numbness Burning   Penicillins Other (See Comments)    Unknown childhood reaction   Sulfa Drugs Cross Reactors Other (See Comments)    Unknown childhood reaction   Sulfur Dioxide Other (See Comments)  Unknown reaction   Ultram [Tramadol] Other (See Comments)    Unknown reaction   Wound Dressing Adhesive Other (See Comments)    Bandaids- Red, blisters   Hibiclens [Chlorhexidine Gluconate] Rash   Latex Rash    No purple gloves   Povidone Rash    Medications: Scheduled: Continuous:  No results found for this or any previous visit (from the past 24 hours).   No results found.  ROS:  As stated above in the HPI otherwise negative.  There were no vitals taken for this visit.    PE: Gen: NAD, Alert and Oriented HEENT:  [place]/AT, EOMI Neck: Supple, no LAD Lungs: CTA Bilaterally CV: RRR without M/G/R ABD: Soft, NTND, +BS Ext: No  C/C/E  Assessment/Plan: 1) IDA - EGD/colonoscopy.  Richerd Grime D 07/25/2023, 7:48 AM

## 2023-07-25 NOTE — Discharge Instructions (Signed)
YOU HAD AN ENDOSCOPIC PROCEDURE TODAY: Refer to the procedure report and other information in the discharge instructions given to you for any specific questions about what was found during the examination. If this information does not answer your questions, please call [facility] at [phone] to clarify.   YOU SHOULD EXPECT: Some feelings of bloating in the abdomen. Passage of more gas than usual. Walking can help get rid of the air that was put into your GI tract during the procedure and reduce the bloating. If you had a lower endoscopy (such as a colonoscopy or flexible sigmoidoscopy) you may notice spotting of blood in your stool or on the toilet paper. Some abdominal soreness may be present for a day or two, also.  DIET: Your first meal following the procedure should be a light meal and then it is ok to progress to your normal diet. A half-sandwich or bowl of soup is an example of a good first meal. Heavy or fried foods are harder to digest and may make you feel nauseous or bloated. Drink plenty of fluids but you should avoid alcoholic beverages for 24 hours.  ACTIVITY: Your care partner should take you home directly after the procedure. You should plan to take it easy, moving slowly for the rest of the day. You can resume normal activity the day after the procedure however YOU SHOULD NOT DRIVE, use power tools, machinery or perform tasks that involve climbing or major physical exertion for 24 hours (because of the sedation medicines used during the test).   SYMPTOMS TO REPORT IMMEDIATELY: A gastroenterologist can be reached at any hour. Please call [phone]  for any of the following symptoms:  Following lower endoscopy (colonoscopy, flexible sigmoidoscopy) Excessive amounts of blood in the stool  Significant tenderness, worsening of abdominal pains  Swelling of the abdomen that is new, acute  Fever of 100 or higher  Following upper endoscopy (EGD, EUS, ERCP, esophageal  dilation) Vomiting of blood or coffee ground material  New, significant abdominal pain  New, significant chest pain or pain under the shoulder blades  Painful or persistently difficult swallowing  New shortness of breath  Black, tarry-looking or red, bloody stools  FOLLOW UP:  If any biopsies were taken you will be contacted by phone or by letter within the next 1-3 weeks. Call [phone]  if you have not heard about the biopsies in 3 weeks.  Please also call with any specific questions about appointments or follow up tests.

## 2023-07-25 NOTE — Anesthesia Postprocedure Evaluation (Signed)
 Anesthesia Post Note  Patient: Charlene Barker  Procedure(s) Performed: ESOPHAGOGASTRODUODENOSCOPY (EGD) WITH PROPOFOL COLONOSCOPY WITH PROPOFOL BIOPSY, gastric POLYPECTOMY , WITH ARGON PLASMA COAGULATION     Patient location during evaluation: PACU Anesthesia Type: MAC Level of consciousness: awake Pain management: pain level controlled Vital Signs Assessment: post-procedure vital signs reviewed and stable Respiratory status: spontaneous breathing, nonlabored ventilation and respiratory function stable Cardiovascular status: stable and blood pressure returned to baseline Postop Assessment: no apparent nausea or vomiting Anesthetic complications: no   No notable events documented.  Last Vitals:  Vitals:   07/25/23 0910 07/25/23 0920  BP: (!) 133/49 (!) 134/43  Pulse: 73 63  Resp: 15 16  Temp:    SpO2: 100% 100%    Last Pain:  Vitals:   07/25/23 0920  TempSrc:   PainSc: 0-No pain                 Linton Rump

## 2023-07-27 ENCOUNTER — Encounter (HOSPITAL_COMMUNITY): Payer: Self-pay | Admitting: Gastroenterology

## 2023-07-28 LAB — SURGICAL PATHOLOGY

## 2023-08-06 DIAGNOSIS — F329 Major depressive disorder, single episode, unspecified: Secondary | ICD-10-CM | POA: Diagnosis not present

## 2023-08-06 DIAGNOSIS — E785 Hyperlipidemia, unspecified: Secondary | ICD-10-CM | POA: Diagnosis not present

## 2023-08-06 DIAGNOSIS — L989 Disorder of the skin and subcutaneous tissue, unspecified: Secondary | ICD-10-CM | POA: Diagnosis not present

## 2023-08-06 DIAGNOSIS — I442 Atrioventricular block, complete: Secondary | ICD-10-CM | POA: Diagnosis not present

## 2023-08-06 DIAGNOSIS — F419 Anxiety disorder, unspecified: Secondary | ICD-10-CM | POA: Diagnosis not present

## 2023-08-06 DIAGNOSIS — Z950 Presence of cardiac pacemaker: Secondary | ICD-10-CM | POA: Diagnosis not present

## 2023-08-06 DIAGNOSIS — R32 Unspecified urinary incontinence: Secondary | ICD-10-CM | POA: Diagnosis not present

## 2023-08-06 DIAGNOSIS — H409 Unspecified glaucoma: Secondary | ICD-10-CM | POA: Diagnosis not present

## 2023-08-06 DIAGNOSIS — I10 Essential (primary) hypertension: Secondary | ICD-10-CM | POA: Diagnosis not present

## 2023-08-06 DIAGNOSIS — E1169 Type 2 diabetes mellitus with other specified complication: Secondary | ICD-10-CM | POA: Diagnosis not present

## 2023-08-06 DIAGNOSIS — Z8673 Personal history of transient ischemic attack (TIA), and cerebral infarction without residual deficits: Secondary | ICD-10-CM | POA: Diagnosis not present

## 2023-08-06 DIAGNOSIS — D649 Anemia, unspecified: Secondary | ICD-10-CM | POA: Diagnosis not present

## 2023-08-11 ENCOUNTER — Ambulatory Visit (INDEPENDENT_AMBULATORY_CARE_PROVIDER_SITE_OTHER): Payer: 59

## 2023-08-11 DIAGNOSIS — I442 Atrioventricular block, complete: Secondary | ICD-10-CM

## 2023-08-14 DIAGNOSIS — N952 Postmenopausal atrophic vaginitis: Secondary | ICD-10-CM | POA: Diagnosis not present

## 2023-08-14 DIAGNOSIS — R300 Dysuria: Secondary | ICD-10-CM | POA: Diagnosis not present

## 2023-08-14 DIAGNOSIS — N760 Acute vaginitis: Secondary | ICD-10-CM | POA: Diagnosis not present

## 2023-08-14 DIAGNOSIS — N390 Urinary tract infection, site not specified: Secondary | ICD-10-CM | POA: Diagnosis not present

## 2023-08-14 LAB — CUP PACEART REMOTE DEVICE CHECK
Battery Remaining Longevity: 120 mo
Battery Remaining Percentage: 95.5 %
Battery Voltage: 3.01 V
Brady Statistic AP VP Percent: 1 %
Brady Statistic AP VS Percent: 1 %
Brady Statistic AS VP Percent: 99 %
Brady Statistic AS VS Percent: 1 %
Brady Statistic RA Percent Paced: 1 %
Brady Statistic RV Percent Paced: 99 %
Date Time Interrogation Session: 20250416094704
Implantable Lead Connection Status: 753985
Implantable Lead Connection Status: 753985
Implantable Lead Implant Date: 20131218
Implantable Lead Implant Date: 20250115
Implantable Lead Location: 753859
Implantable Lead Location: 753860
Implantable Lead Model: 1944
Implantable Pulse Generator Implant Date: 20250115
Lead Channel Impedance Value: 390 Ohm
Lead Channel Impedance Value: 440 Ohm
Lead Channel Pacing Threshold Amplitude: 0.5 V
Lead Channel Pacing Threshold Amplitude: 0.625 V
Lead Channel Pacing Threshold Pulse Width: 0.5 ms
Lead Channel Pacing Threshold Pulse Width: 0.5 ms
Lead Channel Sensing Intrinsic Amplitude: 2.8 mV
Lead Channel Sensing Intrinsic Amplitude: 5.6 mV
Lead Channel Setting Pacing Amplitude: 0.875
Lead Channel Setting Pacing Amplitude: 2 V
Lead Channel Setting Pacing Pulse Width: 0.5 ms
Lead Channel Setting Sensing Sensitivity: 4 mV
Pulse Gen Model: 2272
Pulse Gen Serial Number: 8233198

## 2023-08-18 ENCOUNTER — Encounter: Payer: Self-pay | Admitting: Cardiovascular Disease

## 2023-08-18 ENCOUNTER — Ambulatory Visit: Payer: 59 | Attending: Cardiovascular Disease | Admitting: Cardiovascular Disease

## 2023-08-18 VITALS — BP 130/74 | HR 62 | Ht 64.0 in | Wt 123.6 lb

## 2023-08-18 DIAGNOSIS — I442 Atrioventricular block, complete: Secondary | ICD-10-CM | POA: Diagnosis not present

## 2023-08-18 LAB — CUP PACEART INCLINIC DEVICE CHECK
Battery Remaining Longevity: 124 mo
Battery Voltage: 3.01 V
Brady Statistic RA Percent Paced: 0.34 %
Brady Statistic RV Percent Paced: 99.66 %
Date Time Interrogation Session: 20250421162514
Implantable Lead Connection Status: 753985
Implantable Lead Connection Status: 753985
Implantable Lead Implant Date: 20131218
Implantable Lead Implant Date: 20250115
Implantable Lead Location: 753859
Implantable Lead Location: 753860
Implantable Lead Model: 1944
Implantable Pulse Generator Implant Date: 20250115
Lead Channel Impedance Value: 437.5 Ohm
Lead Channel Impedance Value: 487.5 Ohm
Lead Channel Pacing Threshold Amplitude: 0.5 V
Lead Channel Pacing Threshold Amplitude: 0.5 V
Lead Channel Pacing Threshold Amplitude: 0.5 V
Lead Channel Pacing Threshold Pulse Width: 0.5 ms
Lead Channel Pacing Threshold Pulse Width: 0.5 ms
Lead Channel Pacing Threshold Pulse Width: 0.5 ms
Lead Channel Sensing Intrinsic Amplitude: 3.8 mV
Lead Channel Sensing Intrinsic Amplitude: 5.6 mV
Lead Channel Setting Pacing Amplitude: 0.75 V
Lead Channel Setting Pacing Amplitude: 2 V
Lead Channel Setting Pacing Pulse Width: 0.5 ms
Lead Channel Setting Sensing Sensitivity: 4 mV
Pulse Gen Model: 2272
Pulse Gen Serial Number: 8233198

## 2023-08-18 NOTE — Patient Instructions (Signed)
 Medication Instructions:  Your physician recommends that you continue on your current medications as directed. Please refer to the Current Medication list given to you today. *If you need a refill on your cardiac medications before your next appointment, please call your pharmacy*  Follow-Up: At [facility], you and your health needs are our priority.  As part of our continuing mission to provide you with exceptional heart care, our providers are all part of one team.  This team includes your primary Cardiologist (physician) and Advanced Practice Providers or APPs (Physician Assistants and Nurse Practitioners) who all work together to provide you with the care you need, when you need it.  Your next appointment:   1 year(s)  Provider:   York Pellant, MD        1st Floor: - Lobby - Registration  - Pharmacy  - Lab - Cafe  2nd Floor: - PV Lab - Diagnostic Testing (echo, CT, nuclear med)  3rd Floor: - Vacant  4th Floor: - TCTS (cardiothoracic surgery) - AFib Clinic - Structural Heart Clinic - Vascular Surgery  - Vascular Ultrasound  5th Floor: - [facility] (general and EP) - Clinical Pharmacy for coumadin, hypertension, lipid, weight-loss medications, and med management appointments    Valet parking services will be available as well.

## 2023-08-18 NOTE — Progress Notes (Signed)
 Electrophysiology Office Note:    Date:  08/18/2023   ID:  Charlene, Barker 05-24-1945, MRN [id number]  PCP:  Aldo Hun, MD   [facility] Providers Cardiologist:  Luana Rumple, MD Electrophysiologist:  Efraim Grange, MD     Referring MD: Aldo Hun, MD   History of Present Illness:    Charlene Barker is a 78 y.o. female with a medical history significant for  CHB s/p PPM 2013, HTN, DM2, RA, and BPPV who presents for device follow-up.     She underwent new right ventricular lead in the left bundle branch area with capping of her existing, malfunctioning RV lead.  Discussed the use of AI scribe software for clinical note transcription with the patient, who gave verbal consent to proceed.  History of Present Illness Charlene Barker is a 78 year old female with a history of pacemaker placement who presents with concerns about pacemaker-related symptoms.  She underwent pacemaker placement in 2013 following episodes of syncope. In 2021, she experienced a fall due to ventricular oversensing and noise on her ventricular lead, leading to a revision of her lead in January 2025. This involved the replacement of a right ventricle lead and capping of the old lead.  She feels as though the pacemaker is migrating into her left arm, causing discomfort. She describes symptoms of tingling and pain in her left arm, particularly when sleeping on her left side, and notes that her left side is more swollen than usual. She attributes some of these symptoms to the additional lead placed during the recent procedure, which may be causing some vascular congestion.  She also has difficulty breathing, describing it as 'breathing through a cloth' and needing to force herself to take deep breaths. This sensation has persisted since the pacemaker revision. She was sedentary post-procedure due to restrictions on using her left arm, which was in a sling for six to eight weeks.  Currently, she  is experiencing dizziness, which she attributes to an antibiotic she has been taking since August 15, 2023, at a frequency of every 12 hours.         Today, she is at baseline.  EKGs/Labs/Other Studies Reviewed Today:     Echocardiogram:  TTE January 2025 LVEF 65 to 70%.  Grade 1 diastolic dysfunction.  Normal valvular structure and function   EKG:   EKG Interpretation Date/Time:  Monday August 18 2023 14:39:28 EDT Ventricular Rate:  66 PR Interval:  190 QRS Duration:  122 QT Interval:  452 QTC Calculation: 473 R Axis:   247  Text Interpretation: Atrial-sensed ventricular-paced rhythm When compared with ECG of 15-May-2023 05:05, No significant change was found Confirmed by Marlane Silver [phone]) on 08/18/2023 2:48:09 PM     Physical Exam:    VS:  BP 130/74 (BP Location: Right Arm, Patient Position: Sitting, Cuff Size: Normal)   Pulse 62   Ht 5\' 4"  (1.626 m)   Wt 123 lb 9.6 oz (56.1 kg)   SpO2 98%   BMI 21.22 kg/m     Wt Readings from Last 3 Encounters:  08/18/23 123 lb 9.6 oz (56.1 kg)  07/16/23 125 lb 6.4 oz (56.9 kg)  07/07/23 122 lb 8 oz (55.6 kg)     GEN: Well nourished, well developed in no acute distress CARDIAC: RRR, no murmurs, rubs, gallops RESPIRATORY:  Normal work of breathing MUSCULOSKELETAL: no edema    ASSESSMENT & PLAN:     Abbott dual-chamber pacemaker Underlying complete heart block Status post implantation  of a new left bundle area RV lead January 2025 The prior RV lead was capped Today, she is V paced at 30 bpm. I reviewed today's device interrogation.  See Paceart for details     Signed, Efraim Grange, MD  08/18/2023 3:03 PM    [facility]

## 2023-08-26 DIAGNOSIS — Z860100 Personal history of colon polyps, unspecified: Secondary | ICD-10-CM | POA: Diagnosis not present

## 2023-08-26 DIAGNOSIS — K5900 Constipation, unspecified: Secondary | ICD-10-CM | POA: Diagnosis not present

## 2023-08-27 ENCOUNTER — Telehealth: Payer: Self-pay | Admitting: *Deleted

## 2023-08-27 DIAGNOSIS — N9089 Other specified noninflammatory disorders of vulva and perineum: Secondary | ICD-10-CM | POA: Diagnosis not present

## 2023-08-27 NOTE — Telephone Encounter (Signed)
 Received message from front office staff regarding patient who was brought in by her husband due to dizziness after receiving Lidocaine  at her GYN's office.  She was worried it had affected her pacemaker.  We brought her back and interrogated her device which was working great. She was relieved to know her device was not affected.    She did let the doctor know that did biopsy how she was feeling but was told she was not given that much medication and should not be having dizziness from medication.  The patient then goggled if the lidocaine  could affect her pacemaker and it said yes so she came to have it looked into.  She was going to go home and eat, hydrate, and if still having symptoms call her GYN's office.

## 2023-09-04 DIAGNOSIS — H35372 Puckering of macula, left eye: Secondary | ICD-10-CM | POA: Diagnosis not present

## 2023-09-04 DIAGNOSIS — H2511 Age-related nuclear cataract, right eye: Secondary | ICD-10-CM | POA: Diagnosis not present

## 2023-09-04 DIAGNOSIS — H401133 Primary open-angle glaucoma, bilateral, severe stage: Secondary | ICD-10-CM | POA: Diagnosis not present

## 2023-09-08 ENCOUNTER — Telehealth: Payer: Self-pay | Admitting: Cardiovascular Disease

## 2023-09-08 NOTE — Telephone Encounter (Signed)
 Transmission received. I told her Ethyl Hering will give her a call back.

## 2023-09-08 NOTE — Telephone Encounter (Signed)
 Spoke with pt regarding her symptoms. Pt stated she has been having left arm pain since she became upset this morning, but stated she believes her pacemaker could be causing her pain. Pt stated she is also experiencing some dizziness that causes her to have to sit down. Pt's blood pressures today have been: BP 156/76 HR 75, BP 120/64 HR 69, BP 117/61 HR 71, BP 125/59 HR 70. Pt stated she has taken 2 tylenol  but it has not helped with the pain. Pt was told the information provided would be sent to device team for assistance.

## 2023-09-08 NOTE — Telephone Encounter (Signed)
 Device Transmission received, Normal Device function.  No episodes.   Patient states that after becoming very upset around noon time today, her left shoulder, arm and breast area over device started to ache.  She took 2 Tylenol  at 1230pm and by 230pm the ache was getting better.   I attempted to re-assure her that she can have intermittent aches/discomfort to that site and arm up to 1 year until area finally heals.   She denies any severe CP, SOB or radiating pains through to back or shoulder.  Does mention dizziness but stated symptoms only started after she became upset.  Currently no further symptoms other than mild ache to area.   Due to fears and concerns, device clinic appt made for Thursday 09/11/2023. ER precautions given if develops severe/concerning symptoms.  She is to continue to check her BP.

## 2023-09-08 NOTE — Telephone Encounter (Signed)
 Pt c/o of Chest Pain: STAT if active (IN THIS MOMENT) CP, including tightness, pressure, jaw pain, shoulder/upper arm/back pain, SOB, nausea, and vomiting.  1. Are you having CP right now (tightness, pressure, or discomfort)? Yes, in her left arm  2. Are you experiencing any other symptoms (ex. SOB, nausea, vomiting, sweating)? Dizziness  3. How long have you been experiencing CP? Started at 12 PM  4. Is your CP continuous or coming and going? Continuous  5. Have you taken Nitroglycerin? No, took two tylenol  tablets  6. If CP returns before callback, please consider calling 911. ?

## 2023-09-10 DIAGNOSIS — N904 Leukoplakia of vulva: Secondary | ICD-10-CM | POA: Diagnosis not present

## 2023-09-11 ENCOUNTER — Ambulatory Visit: Attending: Cardiovascular Disease

## 2023-09-11 DIAGNOSIS — I442 Atrioventricular block, complete: Secondary | ICD-10-CM

## 2023-09-15 NOTE — Progress Notes (Signed)
 Device site CDI, no s/s of infection, warm, dry. I have no concerns for device causing pain. MD Mealor in room to assess. He agrees with the above assessment and educated patient that it was likely musculoskeletal.

## 2023-09-19 DIAGNOSIS — Z1289 Encounter for screening for malignant neoplasm of other sites: Secondary | ICD-10-CM | POA: Diagnosis not present

## 2023-09-19 DIAGNOSIS — Z950 Presence of cardiac pacemaker: Secondary | ICD-10-CM | POA: Diagnosis not present

## 2023-09-19 DIAGNOSIS — E1169 Type 2 diabetes mellitus with other specified complication: Secondary | ICD-10-CM | POA: Diagnosis not present

## 2023-09-19 DIAGNOSIS — R8281 Pyuria: Secondary | ICD-10-CM | POA: Diagnosis not present

## 2023-09-19 DIAGNOSIS — I951 Orthostatic hypotension: Secondary | ICD-10-CM | POA: Diagnosis not present

## 2023-09-19 DIAGNOSIS — R7989 Other specified abnormal findings of blood chemistry: Secondary | ICD-10-CM | POA: Diagnosis not present

## 2023-09-19 DIAGNOSIS — N390 Urinary tract infection, site not specified: Secondary | ICD-10-CM | POA: Diagnosis not present

## 2023-09-19 DIAGNOSIS — Z1389 Encounter for screening for other disorder: Secondary | ICD-10-CM | POA: Diagnosis not present

## 2023-09-19 DIAGNOSIS — K625 Hemorrhage of anus and rectum: Secondary | ICD-10-CM | POA: Diagnosis not present

## 2023-09-19 DIAGNOSIS — I10 Essential (primary) hypertension: Secondary | ICD-10-CM | POA: Diagnosis not present

## 2023-09-19 DIAGNOSIS — R42 Dizziness and giddiness: Secondary | ICD-10-CM | POA: Diagnosis not present

## 2023-09-19 DIAGNOSIS — D649 Anemia, unspecified: Secondary | ICD-10-CM | POA: Diagnosis not present

## 2023-09-19 DIAGNOSIS — R195 Other fecal abnormalities: Secondary | ICD-10-CM | POA: Diagnosis not present

## 2023-09-19 DIAGNOSIS — D509 Iron deficiency anemia, unspecified: Secondary | ICD-10-CM | POA: Diagnosis not present

## 2023-09-23 DIAGNOSIS — D509 Iron deficiency anemia, unspecified: Secondary | ICD-10-CM | POA: Diagnosis not present

## 2023-09-23 DIAGNOSIS — E1169 Type 2 diabetes mellitus with other specified complication: Secondary | ICD-10-CM | POA: Diagnosis not present

## 2023-09-23 DIAGNOSIS — N904 Leukoplakia of vulva: Secondary | ICD-10-CM | POA: Diagnosis not present

## 2023-09-23 DIAGNOSIS — K625 Hemorrhage of anus and rectum: Secondary | ICD-10-CM | POA: Diagnosis not present

## 2023-09-23 DIAGNOSIS — R42 Dizziness and giddiness: Secondary | ICD-10-CM | POA: Diagnosis not present

## 2023-09-23 DIAGNOSIS — R195 Other fecal abnormalities: Secondary | ICD-10-CM | POA: Diagnosis not present

## 2023-09-23 DIAGNOSIS — Z950 Presence of cardiac pacemaker: Secondary | ICD-10-CM | POA: Diagnosis not present

## 2023-09-23 DIAGNOSIS — I10 Essential (primary) hypertension: Secondary | ICD-10-CM | POA: Diagnosis not present

## 2023-09-23 DIAGNOSIS — I951 Orthostatic hypotension: Secondary | ICD-10-CM | POA: Diagnosis not present

## 2023-09-25 DIAGNOSIS — K921 Melena: Secondary | ICD-10-CM | POA: Diagnosis not present

## 2023-09-25 DIAGNOSIS — Z860100 Personal history of colon polyps, unspecified: Secondary | ICD-10-CM | POA: Diagnosis not present

## 2023-09-25 DIAGNOSIS — D509 Iron deficiency anemia, unspecified: Secondary | ICD-10-CM | POA: Diagnosis not present

## 2023-09-25 DIAGNOSIS — K5900 Constipation, unspecified: Secondary | ICD-10-CM | POA: Diagnosis not present

## 2023-09-29 ENCOUNTER — Encounter (HOSPITAL_COMMUNITY): Admission: RE | Payer: Self-pay | Source: Home / Self Care

## 2023-09-29 ENCOUNTER — Ambulatory Visit (HOSPITAL_COMMUNITY): Admission: RE | Admit: 2023-09-29 | Source: Home / Self Care | Admitting: Gastroenterology

## 2023-09-29 DIAGNOSIS — H16223 Keratoconjunctivitis sicca, not specified as Sjogren's, bilateral: Secondary | ICD-10-CM | POA: Diagnosis not present

## 2023-09-29 DIAGNOSIS — H401133 Primary open-angle glaucoma, bilateral, severe stage: Secondary | ICD-10-CM | POA: Diagnosis not present

## 2023-09-29 DIAGNOSIS — H35372 Puckering of macula, left eye: Secondary | ICD-10-CM | POA: Diagnosis not present

## 2023-09-29 DIAGNOSIS — Z961 Presence of intraocular lens: Secondary | ICD-10-CM | POA: Diagnosis not present

## 2023-09-29 SURGERY — IMAGING PROCEDURE, GI TRACT, INTRALUMINAL, VIA CAPSULE

## 2023-09-30 NOTE — Progress Notes (Signed)
 Remote pacemaker transmission.

## 2023-09-30 NOTE — Addendum Note (Signed)
 Addended by: Edra Govern D on: 09/30/2023 02:55 PM   Modules accepted: Orders

## 2023-10-04 ENCOUNTER — Encounter (HOSPITAL_BASED_OUTPATIENT_CLINIC_OR_DEPARTMENT_OTHER): Payer: Self-pay | Admitting: *Deleted

## 2023-10-04 ENCOUNTER — Emergency Department (HOSPITAL_BASED_OUTPATIENT_CLINIC_OR_DEPARTMENT_OTHER)
Admission: EM | Admit: 2023-10-04 | Discharge: 2023-10-04 | Disposition: A | Discharge status: Home / Self Care | Attending: Emergency Medicine | Admitting: Emergency Medicine

## 2023-10-04 ENCOUNTER — Other Ambulatory Visit: Payer: Self-pay

## 2023-10-04 DIAGNOSIS — Z91040 Latex allergy status: Secondary | ICD-10-CM | POA: Insufficient documentation

## 2023-10-04 DIAGNOSIS — K0889 Other specified disorders of teeth and supporting structures: Secondary | ICD-10-CM | POA: Insufficient documentation

## 2023-10-04 DIAGNOSIS — Z7982 Long term (current) use of aspirin: Secondary | ICD-10-CM | POA: Insufficient documentation

## 2023-10-04 LAB — CBG MONITORING, ED: Glucose-Capillary: 160 mg/dL — ABNORMAL HIGH (ref 70–99)

## 2023-10-04 MED ORDER — DOXYCYCLINE HYCLATE 100 MG PO CAPS: 100.0000 mg | ORAL_CAPSULE | Freq: Two times a day (BID) | ORAL | 0 refills | Status: DC

## 2023-10-04 MED ORDER — DOXYCYCLINE HYCLATE 100 MG PO TABS
100.0000 mg | ORAL_TABLET | Freq: Once | ORAL | Status: AC
  Administered 2023-10-04: 100 mg via ORAL
  Filled 2023-10-04: qty 1

## 2023-10-04 MED ORDER — TRAMADOL HCL 50 MG PO TABS: 25.0000 mg | ORAL_TABLET | Freq: Four times a day (QID) | ORAL | 0 refills | Status: AC | PRN

## 2023-10-04 MED ORDER — TRAMADOL HCL 50 MG PO TABS
25.0000 mg | ORAL_TABLET | Freq: Once | ORAL | Status: AC
  Administered 2023-10-04: 25 mg via ORAL
  Filled 2023-10-04: qty 1

## 2023-10-04 NOTE — ED Notes (Signed)
 Pt refused CBG to be obtained d/t to her just eating crackers/ pt requesting additional lab work instead/ MD made aware

## 2023-10-04 NOTE — ED Provider Notes (Signed)
 [place] EMERGENCY DEPARTMENT AT [facility] Provider Note   CSN: [id number] Arrival date & time: 10/04/23  1749     History  Chief Complaint  Patient presents with   Dental Pain    Charlene Barker is a 78 y.o. female.  Patient is a 78 year old female who presents with pain in her right upper tooth.  She said it has been decayed but she has not had issues until about 2 days ago.  She has had increasing pain since then.  No swelling of the face.  No fevers.  No vomiting.  She has been trying to get into her dentist which is apparently at the local dental school but she has not been able to as of yet.       Home Medications Prior to Admission medications   Medication Sig Start Date End Date Taking? Authorizing Provider  doxycycline  (VIBRAMYCIN ) 100 MG capsule Take 1 capsule (100 mg total) by mouth 2 (two) times daily. One po bid x 7 days 10/04/23  Yes Hershel Los, MD  traMADol (ULTRAM) 50 MG tablet Take 0.5 tablets (25 mg total) by mouth every 6 (six) hours as needed. 10/04/23  Yes Hershel Los, MD  acetaminophen  (TYLENOL ) 325 MG tablet Take 975 mg by mouth at bedtime as needed for moderate pain (pain score 4-6) or headache. Patient not taking: Reported on 07/16/2023    [provider]  Ascorbic Acid (VITAMIN C) 1000 MG tablet Take 1,000 mg by mouth daily.    [provider]  aspirin  EC 81 MG tablet Take 81 mg by mouth daily.    [provider]  b complex vitamins capsule Take 1 capsule by mouth daily.    [provider]  Cholecalciferol 50 MCG (2000 UT) CAPS Take 2,000 Units by mouth daily.    [provider]  cycloSPORINE (RESTASIS) 0.05 % ophthalmic emulsion 1 drop daily.    [provider]  docusate sodium  (COLACE) 100 MG capsule Take 100 mg by mouth daily.    [provider]  dorzolamide  (TRUSOPT ) 2 % ophthalmic solution Place 1 drop into both eyes daily.    [provider]  latanoprost  (XALATAN )  0.005 % ophthalmic solution Place 1 drop into both eyes at bedtime.    [provider]  LORazepam (ATIVAN) 0.5 MG tablet Take 0.25 mg by mouth daily as needed for anxiety. Patient not taking: Reported on 08/18/2023 08/03/20   [provider]  losartan  (COZAAR ) 25 MG tablet Take 1 tablet (25 mg total) by mouth daily. Take with 50 mg tablet for a total of 75 mg daily 05/08/23 05/07/24  Knox Perl, MD  losartan  (COZAAR ) 50 MG tablet TAKE 1.5 TABLETS (75 MG TOTAL) BY MOUTH DAILY FOR 90 DOSES. 07/03/23   Knox Perl, MD  MACROBID 100 MG capsule Take 100 mg by mouth 2 (two) times daily. 08/14/23   [provider]  timolol  (BETIMOL ) 0.5 % ophthalmic solution Place 1 drop into both eyes daily.    [provider]  Vibegron  (GEMTESA ) 75 MG TABS Take 1 tablet (75 mg total) by mouth daily. 05/28/23   Arma Lamp, MD      Allergies    Cipro [ciprofloxacin hcl], Cleocin [clindamycin], Codeine, Contrast media [iodinated contrast media], Metformin, Penicillins, Sulfa drugs cross reactors, Sulfur dioxide, Wound dressing adhesive, Hibiclens  [chlorhexidine  gluconate], Latex, and Povidone    Review of Systems   Review of Systems  Constitutional:  Negative for fever.  HENT:  Positive for dental  problem. Negative for drooling and facial swelling.   Gastrointestinal:  Negative for nausea and vomiting.  Neurological:  Negative for headaches.    Physical Exam Updated Vital Signs BP (!) 180/78 (BP Location: Right Arm)   Pulse 70   Temp 97.8 F (36.6 C)   Resp 18   SpO2 98%  Physical Exam Constitutional:      Appearance: She is well-developed.  HENT:     Head: Normocephalic and atraumatic.     Mouth/Throat:     Comments: Positive tenderness along the right upper molar.  There is a decayed tooth.  There is no facial swelling.  No induration or fluctuance.  No trismus.  Uvula is midline. Cardiovascular:     Rate and Rhythm: Normal rate.  Pulmonary:     Effort: Pulmonary  effort is normal.  Musculoskeletal:        General: No tenderness.     Cervical back: Normal range of motion and neck supple.  Skin:    General: Skin is warm and dry.  Neurological:     Mental Status: She is alert and oriented to person, place, and time.     ED Results / Procedures / Treatments   Labs (all labs ordered are listed, but only abnormal results are displayed) Labs Reviewed  CBG MONITORING, ED    EKG None  Radiology No results found.  Procedures Procedures    Medications Ordered in ED Medications  doxycycline  (VIBRA -TABS) tablet 100 mg (100 mg Oral Given 10/04/23 1930)  traMADol (ULTRAM) tablet 25 mg (25 mg Oral Given 10/04/23 1930)    ED Course/ Medical Decision Making/ A&P                                 Medical Decision Making Risk Prescription drug management.   Patient presents with pain to the right upper tooth.  I do not see any evidence of dental abscess.  There is no other concerns for deep tissue infection.  She does not appear to be systemically ill.  She has multiple drug allergies.  She is allergic to penicillins as well as clindamycin.  Will start doxycycline .  She also request something for pain that stronger than Tylenol  but she has multiple allergies to pain medications.  She says she cannot tolerate hydrocodone  or oxycodone .  She says that she has never taken tramadol before and would like to try that although I do see this listed as an allergy.  However it is an unknown reaction.  She says that she does not think that she is allergic to tramadol and would like to try half of the tablet.  She was given 25 mg tablet in the ED. she had a little bit of dizziness after the tramadol administration but no other reactions.  She was monitored and had no further symptoms and was discharged home in good condition.  Will give her a prescription for short course of tramadol as well as doxycycline .  Was encouraged to have close follow-up with her dentist.   Return precautions were given.  Final Clinical Impression(s) / ED Diagnoses Final diagnoses:  Pain, dental    Rx / DC Orders ED Discharge Orders          Ordered    doxycycline  (VIBRAMYCIN ) 100 MG capsule  2 times daily        10/04/23 2037    traMADol (ULTRAM) 50 MG tablet  Every 6 hours PRN  10/04/23 2037              Hershel Los, MD 10/04/23 2038

## 2023-10-04 NOTE — Discharge Instructions (Signed)
 Take the antibiotics as discussed.  Try to get into your dentist as soon as possible.  Return to the emergency room if you have any worsening symptoms.

## 2023-10-04 NOTE — ED Triage Notes (Signed)
 Patient to ED right upper side dental pain. Patient called dental school to have them look at a known bad tooth but was told they could not see her until August. Patient reporting not being able to make it that long due to pain.

## 2023-10-09 ENCOUNTER — Other Ambulatory Visit: Payer: Self-pay | Admitting: Gastroenterology

## 2023-10-14 NOTE — Progress Notes (Unsigned)
 [facility] CONSULT NOTE  Patient Care Team: Shayne Anes, MD as PCP - General (Internal Medicine) Mealor, Eulas BRAVO, MD as PCP - Electrophysiology (Cardiology) Croitoru, Jerel, MD as PCP - Cardiology (Cardiology) Bonner Ade, MD as Consulting Physician (Physical Medicine and Rehabilitation) Corbin Mabel NOVAK, MD as Consulting Physician (Ophthalmology)  ASSESSMENT & PLAN:  Wing is a 78 y.o. female with history of iron deficiency anemia, complete heart block status post [facility] pacemaker, diabetes, hypertension, and RA being seen for iron deficiency anemia.  Report history of iron deficiency.  She received IV Feraheme in February 2025.  Last lab showed improvement of iron deficiency anemia in 06/2023. Her colonoscopy in March 2025 and showed 6 nonbleeding colonic angiodysplastic lesion.  Treated with monopolar probe.   The mechanism of IDA is due to either blood loss or decreased absorptive mechanism or both.  If IDA, will order iv iron to be given at [address] if needed.   Assessment & Plan Iron deficiency anemia secondary to blood loss (chronic) Previously with 6 nonbleeding colonic angiodysplastic lesion. Repeat labs today  Orders Placed This Encounter  Procedures   CBC with Differential ([facility] Only)    Standing Status:   Future    Expiration Date:   10/15/2024   Iron and Iron Binding Capacity ([facility] only)    Standing Status:   Future    Expiration Date:   10/15/2024   Ferritin    Standing Status:   Future    Expiration Date:   10/15/2024    All questions were answered. The patient knows to call the clinic with any problems, questions or concerns.  Charlene JAYSON Chihuahua, MD 6/19/20253:16 PM   CHIEF COMPLAINTS/PURPOSE OF CONSULTATION:  Anemia  HISTORY OF PRESENTING ILLNESS:  [facility]ene [facility] 78 y.o. female is here because of anemia.  Per GI records, CBC on 05/21/2023 with a hemoglobin of 8.4 gm/dl with an iron saturation of 8 and Ferritin  of 8   Report history of guaiac positive stool at that time.  She has received 2 doses of Feraheme in 05/2023.  She tolerated well. She denies much difference afterwards. Follow-up testing in March showing improvement of iron deficiency anemia.  She underwent colonoscopy in March 2025 and showed 6 nonbleeding colonic angiodysplastic lesion.  Treated with monopolar probe.  Three 5 to 10 mm polyps in the colon resected.  EGD reported normal.  She is tired from tooth extraction. Report of infection resulted in ED visit. She given doxycycline  and tramadol . She just finished doxycycline  and was placed on azithromycin.   Report she was told blood in the stool when saw her PCP on exam about April.  Since then, [facility] had not noticed any recent bloody stool. She does not take iron. She noted dark stools some days. She cannot tell how often.  She denies use of NSAIDs, but does takes aspirin .  She denies blood donation or received blood transfusion.  ***The patient was prescribed oral iron supplements and she takes ***  MEDICAL HISTORY:  Past Medical History:  Diagnosis Date   Anxiety    Atrioventricular block, complete -intermittent        Complete heart block (HCC) 04/14/2012   Diabetes mellitus without complication (HCC)    per patient.    Drug-induced hyperkalemia -associated with Aldactone    Encounter for care of pacemaker 10/27/2018   GERD (gastroesophageal reflux disease)    Glaucoma    bilateral   HTN (hypertension)    Left  bundle branch block    Lightheadedness    Associated with exercise   Oversensing on the atrial lead 08/27/2013   Pacemaker    [employer] Jude   Paresthesia of both legs 03/02/2015   Rheumatoid arthritis (HCC)    Sinus node dysfunction (HCC) 02/17/2019   Spinal stenosis of lumbar region 03/02/2015   L4-5   Syncope     SURGICAL HISTORY: Past Surgical History:  Procedure Laterality Date   BIOPSY OF SKIN SUBCUTANEOUS TISSUE AND/OR MUCOUS MEMBRANE  07/25/2023    Procedure: BIOPSY, gastric;  Surgeon: Rollin Dover, MD;  Location: [facility];  Service: Gastroenterology;;   BLADDER SURGERY     sling   COLONOSCOPY WITH PROPOFOL  N/A 07/25/2023   Procedure: COLONOSCOPY WITH PROPOFOL ;  Surgeon: Rollin Dover, MD;  Location: [facility];  Service: Gastroenterology;  Laterality: N/A;   ESOPHAGOGASTRODUODENOSCOPY (EGD) WITH PROPOFOL  N/A 07/25/2023   Procedure: ESOPHAGOGASTRODUODENOSCOPY (EGD) WITH PROPOFOL ;  Surgeon: Rollin Dover, MD;  Location: [facility];  Service: Gastroenterology;  Laterality: N/A;   EYE SURGERY     several, bilateral   HOT HEMOSTASIS N/A 07/25/2023   Procedure: , WITH ARGON PLASMA COAGULATION;  Surgeon: Rollin Dover, MD;  Location: [facility];  Service: Gastroenterology;  Laterality: N/A;   LEAD REVISION/REPAIR N/A 05/14/2023   Procedure: LEAD REVISION/REPAIR;  Surgeon: Nancey, Eulas BRAVO, MD;  Location: [facility];  Service: Cardiovascular;  Laterality: N/A;   LOOP RECORDER IMPLANT N/A 09/20/2011   Procedure: LOOP RECORDER IMPLANT;  Surgeon: Elspeth JAYSON Sage, MD;  Location: [facility];  Service: Cardiovascular;  Laterality: N/A;   PERMANENT PACEMAKER INSERTION N/A 04/15/2012   POLYPECTOMY  07/25/2023   Procedure: POLYPECTOMY;  Surgeon: Rollin Dover, MD;  Location: [facility];  Service: Gastroenterology;;   OSCAR GENERATOR CHANGEOUT N/A 05/14/2023   Procedure: PPM GENERATOR CHANGEOUT;  Surgeon: Nancey Eulas BRAVO, MD;  Location: [facility];  Service: Cardiovascular;  Laterality: N/A;    SOCIAL HISTORY: Social History   Socioeconomic History   Marital status: Married    Spouse name: Charlene Barker   Number of children: 1   Years of education: 13   Highest education level: Not on file  Occupational History   Occupation: Retired  Tobacco Use   Smoking status: Never   Smokeless tobacco: Never  Vaping Use   Vaping status: Never Used  Substance and Sexual Activity   Alcohol  use: Not Currently    Comment: wine only at  [employer] time   Drug use: No   Sexual activity: Not Currently    Birth control/protection: Post-menopausal  Other Topics Concern   Not on file  Social History Narrative   Lives at home with husband   caffeine use - chocolate mainly   Patient is left handed.    Social Drivers of Corporate investment banker Strain: Not on file  Food Insecurity: No Food Insecurity (10/16/2023)   Hunger Vital Sign    Worried About Running Out of Food in the Last Year: Never true    Ran Out of Food in the Last Year: Never true  Transportation Needs: No Transportation Needs (10/16/2023)   PRAPARE - Administrator, Civil Service (Medical): No    Lack of Transportation (Non-Medical): No  Physical Activity: Not on file  Stress: Not on file  Social Connections: Patient Declined (05/14/2023)   Social Connection and Isolation Panel    Frequency of Communication with Friends and Family: Patient declined    Frequency of Social Gatherings with Friends  and Family: Patient declined    Attends Religious Services: Patient declined    Active Member of Clubs or Organizations: Patient declined    Attends Banker Meetings: Patient declined    Marital Status: Patient declined  Intimate Partner Violence: Not At Risk (10/16/2023)   Humiliation, Afraid, Rape, and Kick questionnaire    Fear of Current or Ex-Partner: No    Emotionally Abused: No    Physically Abused: No    Sexually Abused: No    FAMILY HISTORY: Family History  Problem Relation Age of Onset   Leukemia Mother    Stroke Father    Heart failure Father    Heart attack Father    Heart disease Father    Diabetes Sister    Asthma Brother    Restless legs syndrome Brother     ALLERGIES:  is allergic to cipro [ciprofloxacin hcl], cleocin [clindamycin], codeine, contrast media [iodinated contrast media], metformin, penicillins, sulfa drugs cross reactors, sulfur dioxide, wound dressing adhesive, hibiclens  [chlorhexidine  gluconate],  latex, and povidone.  MEDICATIONS:  Current Outpatient Medications  Medication Sig Dispense Refill   acetaminophen  (TYLENOL ) 325 MG tablet Take 975 mg by mouth at bedtime as needed for moderate pain (pain score 4-6) or headache. (Patient not taking: Reported on 07/16/2023)     Ascorbic Acid (VITAMIN C) 1000 MG tablet Take 1,000 mg by mouth daily.     aspirin  EC 81 MG tablet Take 81 mg by mouth daily.     b complex vitamins capsule Take 1 capsule by mouth daily.     Cholecalciferol 50 MCG (2000 UT) CAPS Take 2,000 Units by mouth daily.     cycloSPORINE (RESTASIS) 0.05 % ophthalmic emulsion 1 drop daily.     docusate sodium  (COLACE) 100 MG capsule Take 100 mg by mouth daily.     dorzolamide  (TRUSOPT ) 2 % ophthalmic solution Place 1 drop into both eyes daily.     doxycycline  (VIBRAMYCIN ) 100 MG capsule Take 1 capsule (100 mg total) by mouth 2 (two) times daily. One po bid x 7 days 14 capsule 0   latanoprost  (XALATAN ) 0.005 % ophthalmic solution Place 1 drop into both eyes at bedtime.     LORazepam (ATIVAN) 0.5 MG tablet Take 0.25 mg by mouth daily as needed for anxiety. (Patient not taking: Reported on 08/18/2023)     losartan  (COZAAR ) 25 MG tablet Take 1 tablet (25 mg total) by mouth daily. Take with 50 mg tablet for a total of 75 mg daily 90 tablet 3   losartan  (COZAAR ) 50 MG tablet TAKE 1.5 TABLETS (75 MG TOTAL) BY MOUTH DAILY FOR 90 DOSES. 90 tablet 3   MACROBID 100 MG capsule Take 100 mg by mouth 2 (two) times daily.     timolol  (BETIMOL ) 0.5 % ophthalmic solution Place 1 drop into both eyes daily.     traMADol  (ULTRAM ) 50 MG tablet Take 0.5 tablets (25 mg total) by mouth every 6 (six) hours as needed. 10 tablet 0   Vibegron  (GEMTESA ) 75 MG TABS Take 1 tablet (75 mg total) by mouth daily. 90 tablet 3   No current facility-administered medications for this visit.    REVIEW OF SYSTEMS:   All relevant systems were reviewed with the patient and are negative.  PHYSICAL EXAMINATION: ECOG  PERFORMANCE STATUS: {CHL ONC ECOG ED:[id number]}  Vitals:   10/16/23 1437 10/16/23 1438  BP: (!) 152/80 (!) 150/66  Pulse: 65   Resp: 18   Temp: (!) 97.3 F (36.3 C)  SpO2: 99%    Filed Weights   10/16/23 1437  Weight: 123 lb 14.4 oz (56.2 kg)    GENERAL: alert, no distress and comfortable SKIN: skin color normal EYES: normal conjunctiva, sclera clear LUNGS: normal breathing effort HEART: regular rate & rhythm ABDOMEN: abdomen soft, non-tender and nondistended  RADIOGRAPHIC STUDIES: I have personally reviewed the radiological images as listed and agreed with the findings in the report. No results found.

## 2023-10-15 ENCOUNTER — Telehealth: Payer: Self-pay

## 2023-10-15 NOTE — Telephone Encounter (Signed)
 Charlene Barker is worried that her current medications will affect her Iron count. Charlene [facility] mentioned that she will keep her appointment on tomorrow as our next available to re-schedule her would be 6/30. Charlene Barker opted into keeping her appointment because of her iron levels.

## 2023-10-15 NOTE — Telephone Encounter (Signed)
 This is a new pt scheduled for an OV on 6/19.  Pt called with concerns with her lab work for tomorrow.  She had a dental procedure and is concerned it will affect her results.   Pt advised that we will make the Dr aware and if she still feels up to it she can keep her appt.  It should not affect the lab results but we can always reschedule if she prefers.  Pt said she would keep the appt for now.

## 2023-10-16 ENCOUNTER — Inpatient Hospital Stay

## 2023-10-16 VITALS — BP 150/66 | HR 65 | Temp 97.3°F | Resp 18 | Ht 64.0 in | Wt 123.9 lb

## 2023-10-16 DIAGNOSIS — K5521 Angiodysplasia of colon with hemorrhage: Secondary | ICD-10-CM | POA: Diagnosis not present

## 2023-10-16 DIAGNOSIS — Z79899 Other long term (current) drug therapy: Secondary | ICD-10-CM | POA: Insufficient documentation

## 2023-10-16 DIAGNOSIS — D500 Iron deficiency anemia secondary to blood loss (chronic): Secondary | ICD-10-CM | POA: Insufficient documentation

## 2023-10-16 DIAGNOSIS — Z7982 Long term (current) use of aspirin: Secondary | ICD-10-CM | POA: Insufficient documentation

## 2023-10-16 LAB — IRON AND IRON BINDING CAPACITY (CC-WL,HP ONLY)
Iron: 44 ug/dL (ref 28–170)
Saturation Ratios: 10 % — ABNORMAL LOW (ref 10.4–31.8)
TIBC: 440 ug/dL (ref 250–450)
UIBC: 396 ug/dL (ref 148–442)

## 2023-10-16 LAB — CBC WITH DIFFERENTIAL (CANCER CENTER ONLY)
Abs Immature Granulocytes: 0.04 10*3/uL (ref 0.00–0.07)
Basophils Absolute: 0.1 10*3/uL (ref 0.0–0.1)
Basophils Relative: 1 %
Eosinophils Absolute: 0.3 10*3/uL (ref 0.0–0.5)
Eosinophils Relative: 4 %
HCT: 37.8 % (ref 36.0–46.0)
Hemoglobin: 13 g/dL (ref 12.0–15.0)
Immature Granulocytes: 1 %
Lymphocytes Relative: 35 %
Lymphs Abs: 2.9 10*3/uL (ref 0.7–4.0)
MCH: 32.4 pg (ref 26.0–34.0)
MCHC: 34.4 g/dL (ref 30.0–36.0)
MCV: 94.3 fL (ref 80.0–100.0)
Monocytes Absolute: 0.9 10*3/uL (ref 0.1–1.0)
Monocytes Relative: 11 %
Neutro Abs: 4.1 10*3/uL (ref 1.7–7.7)
Neutrophils Relative %: 48 %
Platelet Count: 289 10*3/uL (ref 150–400)
RBC: 4.01 MIL/uL (ref 3.87–5.11)
RDW: 11.6 % (ref 11.5–15.5)
WBC Count: 8.3 10*3/uL (ref 4.0–10.5)
nRBC: 0 % (ref 0.0–0.2)

## 2023-10-16 NOTE — Assessment & Plan Note (Addendum)
 Previously with 6 nonbleeding colonic angiodysplastic lesion. Repeat labs today

## 2023-10-17 LAB — FERRITIN: Ferritin: 90 ng/mL (ref 11–307)

## 2023-10-20 ENCOUNTER — Encounter (HOSPITAL_COMMUNITY): Payer: Self-pay | Admitting: Gastroenterology

## 2023-10-20 ENCOUNTER — Ambulatory Visit: Payer: Self-pay

## 2023-10-20 NOTE — Telephone Encounter (Signed)
-----   Message from Pauletta JAYSON Chihuahua sent at 10/20/2023  7:49 AM EDT ----- Zorita please let her know. No anemia but has borderline low iron.  Recommend ferrous sulfate 325 mg nightly and follow up repeat labs in about 3 months. Labs about 2 weeks before next visit if able. Thank you. Follow up GI if having dark, or bloody stool. ----- Message ----- From: Interface, Lab In [place] Sent: 10/16/2023   3:45 PM EDT To: Pauletta JAYSON Chihuahua, MD

## 2023-10-20 NOTE — Telephone Encounter (Signed)
 Notified of message below. Verbalized understanding

## 2023-10-24 ENCOUNTER — Ambulatory Visit (HOSPITAL_COMMUNITY): Admission: RE | Admit: 2023-10-24 | Source: Home / Self Care | Admitting: Gastroenterology

## 2023-10-24 ENCOUNTER — Encounter (HOSPITAL_COMMUNITY): Admission: RE | Payer: Self-pay | Source: Home / Self Care

## 2023-10-24 SURGERY — ENTEROSCOPY
Anesthesia: Monitor Anesthesia Care

## 2023-11-10 ENCOUNTER — Ambulatory Visit (INDEPENDENT_AMBULATORY_CARE_PROVIDER_SITE_OTHER): Payer: 59

## 2023-11-10 DIAGNOSIS — I442 Atrioventricular block, complete: Secondary | ICD-10-CM

## 2023-11-14 LAB — CUP PACEART REMOTE DEVICE CHECK
Battery Remaining Longevity: 118 mo
Battery Remaining Percentage: 95.5 %
Battery Voltage: 2.99 V
Brady Statistic AP VP Percent: 1.8 %
Brady Statistic AP VS Percent: 1 %
Brady Statistic AS VP Percent: 98 %
Brady Statistic AS VS Percent: 1 %
Brady Statistic RA Percent Paced: 1.5 %
Brady Statistic RV Percent Paced: 99 %
Date Time Interrogation Session: 20250717133515
Implantable Lead Connection Status: 753985
Implantable Lead Connection Status: 753985
Implantable Lead Implant Date: 20131218
Implantable Lead Implant Date: 20250115
Implantable Lead Location: 753859
Implantable Lead Location: 753860
Implantable Lead Model: 1944
Implantable Pulse Generator Implant Date: 20250115
Lead Channel Impedance Value: 440 Ohm
Lead Channel Impedance Value: 490 Ohm
Lead Channel Pacing Threshold Amplitude: 0.5 V
Lead Channel Pacing Threshold Amplitude: 0.625 V
Lead Channel Pacing Threshold Pulse Width: 0.5 ms
Lead Channel Pacing Threshold Pulse Width: 0.5 ms
Lead Channel Sensing Intrinsic Amplitude: 12 mV
Lead Channel Sensing Intrinsic Amplitude: 3.5 mV
Lead Channel Setting Pacing Amplitude: 0.875
Lead Channel Setting Pacing Amplitude: 2 V
Lead Channel Setting Pacing Pulse Width: 0.5 ms
Lead Channel Setting Sensing Sensitivity: 4 mV
Pulse Gen Model: 2272
Pulse Gen Serial Number: 8233198

## 2023-11-17 DIAGNOSIS — I10 Essential (primary) hypertension: Secondary | ICD-10-CM | POA: Diagnosis not present

## 2023-11-17 DIAGNOSIS — D649 Anemia, unspecified: Secondary | ICD-10-CM | POA: Diagnosis not present

## 2023-11-17 DIAGNOSIS — E785 Hyperlipidemia, unspecified: Secondary | ICD-10-CM | POA: Diagnosis not present

## 2023-11-17 DIAGNOSIS — E1169 Type 2 diabetes mellitus with other specified complication: Secondary | ICD-10-CM | POA: Diagnosis not present

## 2023-11-18 ENCOUNTER — Ambulatory Visit: Payer: Self-pay | Admitting: Cardiovascular Disease

## 2023-11-19 DIAGNOSIS — M545 Low back pain, unspecified: Secondary | ICD-10-CM | POA: Diagnosis not present

## 2023-11-20 ENCOUNTER — Telehealth: Payer: Self-pay | Admitting: Cardiology

## 2023-11-20 NOTE — Telephone Encounter (Signed)
   Patient called outpatient line in regards to prednisone she was just given by [facility] for chronic back pain. She tells me that she took 10 mg around 4 PM today which caused her to feel more dizzy and have trouble breathing. She tells me that she has dizziness at baseline but this is worse than her normal. I told her that if she is experiencing any difficulty breathing, especially with a new medication, she should be evaluated by someone in person. I advised her to not take any more prednisone, she tells me that she was instructed to not stop taking the pills. I re-educated the patient on not taking any more of the prednisone until she can be evaluated. She tells me that she will go and get evaluated once her husband gets home, she does not know when this is. I discussed activating EMS if she feels like she cannot breathe, she tells me that she is not going to do that. After a long discussion, she tells me she is going to wait for her husband to get home and take her to be seen at an urgent care or emergency room and she is going to call [facility] to let them know.   Charlene DELENA Donath, PA-C 11/20/2023 6:56 PM

## 2023-11-21 ENCOUNTER — Telehealth: Payer: Self-pay | Admitting: Cardiovascular Disease

## 2023-11-21 NOTE — Telephone Encounter (Signed)
 Spoke with pt and advised remote transmission and recommended pt contact the prescribing provider of the prednisone to discuss further.  Pt verbalizes understanding and thanked Charity fundraiser for the call.

## 2023-11-21 NOTE — Telephone Encounter (Signed)
 Normal device function. Presenting rhythm AS/VS. No alerts triggered. Routing to [place], Charity fundraiser for update.

## 2023-11-21 NOTE — Telephone Encounter (Signed)
 Spoke with pt who states she was prescribed Prednisone 5mg  for hip pain and took 2 tabs yesterday.  She felt as if her heart slowed down and she wants to make sure her pacemaker is working correctly.  She has not taken any Prednisone today.  Requested pt send a device transmission to be reviewed and pt did that while on the phone with this RN.  Pt denies current CP, SOB or dizziness.  She will await transmission review.

## 2023-11-21 NOTE — Telephone Encounter (Signed)
 Pt was put on Pednisone and would like to know if it's safe to take since she has a Visual merchandiser. Please advise.

## 2023-11-25 DIAGNOSIS — M545 Low back pain, unspecified: Secondary | ICD-10-CM | POA: Diagnosis not present

## 2023-12-01 ENCOUNTER — Ambulatory Visit: Attending: Physician Assistant | Admitting: Physical Therapy

## 2023-12-01 DIAGNOSIS — R2689 Other abnormalities of gait and mobility: Secondary | ICD-10-CM | POA: Insufficient documentation

## 2023-12-01 DIAGNOSIS — M79604 Pain in right leg: Secondary | ICD-10-CM | POA: Diagnosis not present

## 2023-12-01 DIAGNOSIS — R2681 Unsteadiness on feet: Secondary | ICD-10-CM | POA: Diagnosis not present

## 2023-12-01 DIAGNOSIS — M6281 Muscle weakness (generalized): Secondary | ICD-10-CM | POA: Insufficient documentation

## 2023-12-01 DIAGNOSIS — M5459 Other low back pain: Secondary | ICD-10-CM | POA: Insufficient documentation

## 2023-12-01 NOTE — Therapy (Signed)
 OUTPATIENT PHYSICAL THERAPY THORACOLUMBAR EVALUATION   Patient Name: Charlene Barker MRN: [id number] DOB:1945/11/17, 78 y.o., female Today's Date: 12/01/2023  END OF SESSION:  PT End of Session - 12/01/23 1104     Visit Number 1    Number of Visits 7   with eval   Date for PT Re-Evaluation 01/26/24    Authorization Type Medicare    PT Start Time 1100    PT Stop Time 1145    PT Time Calculation (min) 45 min    Activity Tolerance Patient tolerated treatment well    Behavior During Therapy WFL for tasks assessed/performed          Past Medical History:  Diagnosis Date   Anxiety    Atrioventricular block, complete -intermittent        Complete heart block (HCC) 04/14/2012   Diabetes mellitus without complication (HCC)    per patient.    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   Pacemaker    St Jude   Paresthesia of both legs 03/02/2015   Rheumatoid arthritis (HCC)    Sinus node dysfunction (HCC) 02/17/2019   Spinal stenosis of lumbar region 03/02/2015   L4-5   Syncope    Past Surgical History:  Procedure Laterality Date   BIOPSY OF SKIN SUBCUTANEOUS TISSUE AND/OR MUCOUS MEMBRANE  07/25/2023   Procedure: BIOPSY, gastric;  Surgeon: Charlene Dover, MD;  Location: [facility];  Service: Gastroenterology;;   BLADDER SURGERY     sling   COLONOSCOPY WITH PROPOFOL  N/A 07/25/2023   Procedure: COLONOSCOPY WITH PROPOFOL ;  Surgeon: Charlene Dover, MD;  Location: [facility];  Service: Gastroenterology;  Laterality: N/A;   ESOPHAGOGASTRODUODENOSCOPY (EGD) WITH PROPOFOL  N/A 07/25/2023   Procedure: ESOPHAGOGASTRODUODENOSCOPY (EGD) WITH PROPOFOL ;  Surgeon: Charlene Dover, MD;  Location: [facility];  Service:  Gastroenterology;  Laterality: N/A;   EYE SURGERY     several, bilateral   HOT HEMOSTASIS N/A 07/25/2023   Procedure: , WITH ARGON PLASMA COAGULATION;  Surgeon: Charlene Dover, MD;  Location: [facility];  Service: Gastroenterology;  Laterality: N/A;   LEAD REVISION/REPAIR N/A 05/14/2023   Procedure: LEAD REVISION/REPAIR;  Surgeon: Nancey, Charlene BRAVO, MD;  Location: [facility];  Service: Cardiovascular;  Laterality: N/A;   LOOP RECORDER IMPLANT N/A 09/20/2011   Procedure: LOOP RECORDER IMPLANT;  Surgeon: Charlene JAYSON Sage, MD;  Location: [facility];  Service: Cardiovascular;  Laterality: N/A;   PERMANENT PACEMAKER INSERTION N/A 04/15/2012   POLYPECTOMY  07/25/2023   Procedure: POLYPECTOMY;  Surgeon: Charlene Dover, MD;  Location: [facility];  Service: Gastroenterology;;   OSCAR GENERATOR CHANGEOUT N/A 05/14/2023   Procedure: PPM GENERATOR CHANGEOUT;  Surgeon: Nancey Charlene BRAVO, MD;  Location: [facility];  Service: Cardiovascular;  Laterality: N/A;   Patient Active Problem List   Diagnosis Date Noted   Sensorineural hearing loss, bilateral 07/08/2023   Chronic rhinitis 07/08/2023   Deviated nasal septum 07/08/2023   Hypertrophy of nasal turbinates 07/08/2023   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   Near syncope 05/13/2023   Sinus node dysfunction (HCC) 02/17/2019   Palpitations 02/17/2019   Encounter for care of pacemaker 10/27/2018   Tremor 07/18/2016   Paresthesia 03/02/2015  Spinal stenosis of lumbar region 03/02/2015   Other specified transient cerebral ischemias 12/27/2014   Mechanical complication of cardiac pacemaker electrode 08/27/2013   Anxiety 08/13/2012   Pacemaker--St Jude Accent DR 2110 dual chamber pacemaker12/18/2013 04/15/2012   Complete heart block (HCC) 04/14/2012   Syncope    Left bundle branch block    Dizziness     PCP: Charlene Anes, MD  REFERRING PROVIDER: Candance Jeoffrey SAILOR, PA-C at [facility]  REFERRING DIAG:  M54.50 (ICD-10-CM) - Low back pain, unspecified  Rationale for Evaluation and Treatment: Rehabilitation  THERAPY DIAG:  Muscle weakness (generalized)  Other abnormalities of gait and mobility  Unsteadiness on feet  Other low back pain  Pain in right leg  ONSET DATE: 11/25/2023 (referral date)  SUBJECTIVE:                                                                                                                                                                                           SUBJECTIVE STATEMENT:  Pt enters clinic with Charlene Barker, husband waits in lobby. She reports that on 11/18/23 she turned the wrong way trying to get into bed and twisted or tweaked her back. She reports that initially the pain was localized to her R side low back but now is in her R hip, her R outer thigh, all the way down to her R knee. Her pain has improved since her initial injury except for the progression down her leg. She went to [facility] and reports they took xrays, showed she has scoliosis and degeneration in her spine. She reports that she feels the pain more if she turns too quickly or the wrong way, makes doing housework difficult. She reports that she can tolerate sleeping on her R or her L side, cannot tolerate sleeping on her back. Her pain does improve if she uses a walker or holds onto a shopping cart but she feels that the walker is overall cumbersome and prefers not to use it.   Of note, pt also reports that she has glaucoma and that her vision is very impaired. She also reports having memory issues.   PERTINENT HISTORY:  PMH: anxiety, DM, pacemaker, GERD, B glaucoma, HTN, RA, lumbar spinal stenosis, syncope  PAIN:  Are you having pain? Yes: NPRS scale: 6/10 Pain location: R lower back into RLE to her knee, hurts in R lateral thigh Pain description: radiates down R leg but no N/T Aggravating factors: standing and walking Relieving factors: sitting and laying down  PRECAUTIONS: Fall  and ICD/Pacemaker  RED FLAGS: None   WEIGHT BEARING RESTRICTIONS: No  FALLS:  Has patient fallen in last  6 months? No  LIVING ENVIRONMENT: Lives with: lives with their spouse Charlene Barker Lives in: House/apartment Stairs: Yes: External: 3 steps; none Has following equipment at home: Single point cane, Walker - 2 wheeled, and transport chair  OCCUPATION: retired  PLOF: Independent with gait, Independent with transfers, and Requires assistive device for independence  PATIENT GOALS: been trying to do some therapy on my own in the bed before getting up and it has helped a bit - unable to state a specific goal  NEXT MD VISIT: just saw them again last week - unable to provide next appt date  OBJECTIVE:  Note: Objective measures were completed at Evaluation unless otherwise noted.  DIAGNOSTIC FINDINGS:  From referring provider note: reviewed/interpreted ap/lateral x-rays of lumbar spine showing degenerative scoliosis with multilevel disk degeneration, no fractures  PATIENT SURVEYS:  Oswestry: 30/50  COGNITION: Overall cognitive status: History of cognitive impairments - at baseline, memory impairments per patient     SENSATION: WFL  MUSCLE LENGTH: Hamstrings: tight HS  POSTURE: rounded shoulders, forward head, posterior pelvic tilt, and scoliosis  PALPATION: Not performed due to time constraints  LUMBAR ROM:   AROM eval  Flexion 50%, stretch on R side  Extension 50%, immediate pain down R side  Right lateral flexion 25%, pain  Left lateral flexion 50%, stretch on R side  Right rotation 50%, pain  Left rotation 25%, major pain   (Blank rows = not tested)  LOWER EXTREMITY ROM:   WFL  Active  Right eval Left eval  Hip flexion    Hip extension    Hip abduction    Hip adduction    Hip internal rotation    Hip external rotation    Knee flexion    Knee extension    Ankle dorsiflexion    Ankle plantarflexion    Ankle inversion    Ankle eversion     (Blank  rows = not tested)  LOWER EXTREMITY MMT:    MMT Right eval Left eval  Hip flexion 5 4  Hip extension    Hip abduction    Hip adduction    Hip internal rotation    Hip external rotation    Knee flexion 5 4  Knee extension 5 4  Ankle dorsiflexion 5 5  Ankle plantarflexion    Ankle inversion    Ankle eversion     (Blank rows = not tested)  LUMBAR SPECIAL TESTS:  Not performed due to time constraints  FUNCTIONAL TESTS:  Not performed due to time constraints  GAIT: Distance walked: various clinic distances Assistive device utilized: Single point cane Level of assistance: SBA Comments: antalgic, unsteady, to be further assessed in a functional context  TREATMENT DATE: PT Evaluation   Self-Care/Home Management Discussed benefits of using a RW for energy conservation and pain management with BUE support despite patient's hesitancy to use a RW at this time. Will continue to educate her on most appropriate AD.  PATIENT EDUCATION:  Education details: Eval findings, PT POC Person educated: Patient Education method: Explanation Education comprehension: verbalized understanding and needs further education  HOME EXERCISE PROGRAM: To be initiated  ASSESSMENT:  CLINICAL IMPRESSION: Patient is a 78 year old female referred to [facility] for low back pain.   Pt's PMH is significant for: anxiety, DM, pacemaker, GERD, B glaucoma, HTN, RA, lumbar spinal stenosis, and syncope. The following deficits were present during the exam: decreased BLE strength, decreased lumbar ROM, and impaired balance. Based on her gait impairments, pt is an increased risk for falls. Pt would benefit from skilled PT to address these impairments and functional limitations to maximize functional mobility independence.   OBJECTIVE IMPAIRMENTS: Abnormal gait, decreased cognition, decreased  knowledge of condition, decreased knowledge of use of DME, decreased mobility, difficulty walking, decreased ROM, decreased strength, impaired vision/preception, improper body mechanics, postural dysfunction, and pain.   ACTIVITY LIMITATIONS: carrying, lifting, bending, sitting, standing, squatting, stairs, transfers, and bed mobility  PARTICIPATION LIMITATIONS: meal prep, cleaning, laundry, driving, and community activity  PERSONAL FACTORS: Age and 3+ comorbidities:  anxiety, DM, pacemaker, GERD, B glaucoma, HTN, RA, lumbar spinal stenosis, syncopeare also affecting patient's functional outcome.   REHAB POTENTIAL: Good  CLINICAL DECISION MAKING: Evolving/moderate complexity  EVALUATION COMPLEXITY: Moderate   GOALS: Goals reviewed with patient? Yes  SHORT TERM GOALS: Target date: 12/23/2023   Pt will be independent with initial HEP for improved strength, balance, transfers and gait and increased independence with management of her pain symptoms. Baseline: Goal status: INITIAL  2.  5xSTS to be assessed and LTG set if appropriate Baseline:  Goal status: INITIAL  3.  TUG to be assessed and LTG set if appropriate Baseline:  Goal status: INITIAL  4.  Gait velocity to be assessed and LTG set if appropriate Baseline:  Goal status: INITIAL  5.  FGA to be assessed and LTG set if appropriate Baseline:  Goal status: INITIAL   LONG TERM GOALS: Target date: 01/13/2024   Pt will be independent with final HEP for improved strength, balance, transfers and gait and increased independence with management of her pain symptoms. Baseline:  Goal status: INITIAL  2.  5xSTS to be assessed and LTG set if appropriate Baseline:  Goal status: INITIAL  3.  TUG to be assessed and LTG set if appropriate Baseline:  Goal status: INITIAL  4.  Gait velocity to be assessed and LTG set if appropriate Baseline:  Goal status: INITIAL  5.  FGA to be assessed and LTG set if appropriate Baseline:   Goal status: INITIAL  6.  Pt will improve her lumbar ROM by 25% in limited direction with no increase in pain. Baseline: see eval Goal status: INITIAL  7.  Pt will improve her score on the Oswestry to </= 24/50 to demonstrate decreased disability level. Baseline: 30/50 - severe disability (8/4) Goal status: INITIAL  PLAN:  PT FREQUENCY: 1x/week  PT DURATION: 6 weeks  PLANNED INTERVENTIONS: 02835- PT Re-evaluation, 97750- Physical Performance Testing, 97110-Therapeutic exercises, 97530- Therapeutic activity, 97112- Neuromuscular re-education, 97535- Self Care, 02859- Manual therapy, [phone]- Gait training, [phone]- Aquatic Therapy, [phone] (1-2 muscles), [phone] (3+ muscles)- Dry Needling, Patient/Family education, Balance training, Stair training, Taping, Joint mobilization, Spinal mobilization, Cognitive remediation, DME instructions, Cryotherapy, and Moist heat.  PLAN FOR NEXT SESSION: assess slump test, palpation, assess SL hip strength, lumbar special tests, 5xSTS, TUG, speed, Berg/FGA and set LTG; initiate HEP to work on core strengthening/stability, work on Astronomer impairments, trial RW and rollator  Makenzie Weisner, PT [place], PT, DPT, CSRS  12/01/2023, 11:46 AM

## 2023-12-02 DIAGNOSIS — N904 Leukoplakia of vulva: Secondary | ICD-10-CM | POA: Diagnosis not present

## 2023-12-04 DIAGNOSIS — E1169 Type 2 diabetes mellitus with other specified complication: Secondary | ICD-10-CM | POA: Diagnosis not present

## 2023-12-04 DIAGNOSIS — E559 Vitamin D deficiency, unspecified: Secondary | ICD-10-CM | POA: Diagnosis not present

## 2023-12-04 DIAGNOSIS — Z950 Presence of cardiac pacemaker: Secondary | ICD-10-CM | POA: Diagnosis not present

## 2023-12-04 DIAGNOSIS — I10 Essential (primary) hypertension: Secondary | ICD-10-CM | POA: Diagnosis not present

## 2023-12-04 DIAGNOSIS — F419 Anxiety disorder, unspecified: Secondary | ICD-10-CM | POA: Diagnosis not present

## 2023-12-04 DIAGNOSIS — D509 Iron deficiency anemia, unspecified: Secondary | ICD-10-CM | POA: Diagnosis not present

## 2023-12-04 DIAGNOSIS — R32 Unspecified urinary incontinence: Secondary | ICD-10-CM | POA: Diagnosis not present

## 2023-12-04 DIAGNOSIS — M545 Low back pain, unspecified: Secondary | ICD-10-CM | POA: Diagnosis not present

## 2023-12-04 DIAGNOSIS — D649 Anemia, unspecified: Secondary | ICD-10-CM | POA: Diagnosis not present

## 2023-12-10 ENCOUNTER — Ambulatory Visit: Admitting: Physical Therapy

## 2023-12-10 DIAGNOSIS — M5459 Other low back pain: Secondary | ICD-10-CM | POA: Diagnosis not present

## 2023-12-10 DIAGNOSIS — M79604 Pain in right leg: Secondary | ICD-10-CM

## 2023-12-10 DIAGNOSIS — R2689 Other abnormalities of gait and mobility: Secondary | ICD-10-CM

## 2023-12-10 DIAGNOSIS — M6281 Muscle weakness (generalized): Secondary | ICD-10-CM

## 2023-12-10 DIAGNOSIS — R2681 Unsteadiness on feet: Secondary | ICD-10-CM | POA: Diagnosis not present

## 2023-12-10 NOTE — Therapy (Signed)
 OUTPATIENT PHYSICAL THERAPY THORACOLUMBAR TREATMENT - RECERTIFICATION   Patient Name: Charlene Barker MRN: [id number] DOB:Nov 07, 1945, 78 y.o., female Today's Date: 12/10/2023  END OF SESSION:  PT End of Session - 12/10/23 1319     Visit Number 2    Number of Visits 13   recert   Date for PT Re-Evaluation 01/26/24    Authorization Type Medicare    PT Start Time 1315    PT Stop Time 1411    PT Time Calculation (min) 56 min    Equipment Utilized During Treatment Gait belt    Activity Tolerance Patient tolerated treatment well    Behavior During Therapy WFL for tasks assessed/performed           Past Medical History:  Diagnosis Date   Anxiety    Atrioventricular block, complete -intermittent        Complete heart block (HCC) 04/14/2012   Diabetes mellitus without complication (HCC)    per patient.    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   Pacemaker    St Jude   Paresthesia of both legs 03/02/2015   Rheumatoid arthritis (HCC)    Sinus node dysfunction (HCC) 02/17/2019   Spinal stenosis of lumbar region 03/02/2015   L4-5   Syncope    Past Surgical History:  Procedure Laterality Date   BIOPSY OF SKIN SUBCUTANEOUS TISSUE AND/OR MUCOUS MEMBRANE  07/25/2023   Procedure: BIOPSY, gastric;  Surgeon: Rollin Dover, MD;  Location: [facility];  Service: Gastroenterology;;   BLADDER SURGERY     sling   COLONOSCOPY WITH PROPOFOL  N/A 07/25/2023   Procedure: COLONOSCOPY WITH PROPOFOL ;  Surgeon: Rollin Dover, MD;  Location: [facility];  Service: Gastroenterology;  Laterality: N/A;   ESOPHAGOGASTRODUODENOSCOPY (EGD) WITH PROPOFOL  N/A 07/25/2023   Procedure: ESOPHAGOGASTRODUODENOSCOPY (EGD) WITH PROPOFOL ;   Surgeon: Rollin Dover, MD;  Location: [facility];  Service: Gastroenterology;  Laterality: N/A;   EYE SURGERY     several, bilateral   HOT HEMOSTASIS N/A 07/25/2023   Procedure: , WITH ARGON PLASMA COAGULATION;  Surgeon: Rollin Dover, MD;  Location: [facility];  Service: Gastroenterology;  Laterality: N/A;   LEAD REVISION/REPAIR N/A 05/14/2023   Procedure: LEAD REVISION/REPAIR;  Surgeon: Nancey, Eulas BRAVO, MD;  Location: [facility];  Service: Cardiovascular;  Laterality: N/A;   LOOP RECORDER IMPLANT N/A 09/20/2011   Procedure: LOOP RECORDER IMPLANT;  Surgeon: Elspeth JAYSON Sage, MD;  Location: [facility];  Service: Cardiovascular;  Laterality: N/A;   PERMANENT PACEMAKER INSERTION N/A 04/15/2012   POLYPECTOMY  07/25/2023   Procedure: POLYPECTOMY;  Surgeon: Rollin Dover, MD;  Location: [facility];  Service: Gastroenterology;;   OSCAR GENERATOR CHANGEOUT N/A 05/14/2023   Procedure: PPM GENERATOR CHANGEOUT;  Surgeon: Nancey Eulas BRAVO, MD;  Location: [facility];  Service: Cardiovascular;  Laterality: N/A;   Patient Active Problem List   Diagnosis Date Noted   Sensorineural hearing loss, bilateral 07/08/2023   Chronic rhinitis 07/08/2023   Deviated nasal septum 07/08/2023   Hypertrophy of nasal turbinates 07/08/2023   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   Near syncope 05/13/2023   Sinus node dysfunction (HCC) 02/17/2019   Palpitations 02/17/2019   Encounter for care  of pacemaker 10/27/2018   Tremor 07/18/2016   Paresthesia 03/02/2015   Spinal stenosis of lumbar region 03/02/2015   Other specified transient cerebral ischemias 12/27/2014   Mechanical complication of cardiac pacemaker electrode 08/27/2013   Anxiety 08/13/2012   Pacemaker--St Jude Acc[employer] DR 2110 dual chamber pacemaker12/18/2013 04/15/2012   Complete heart block (HCC) 04/14/2012   Syncope    Left bundle branch block    Dizziness     PCP: Shayne Anes, MD  REFERRING  PROVIDER: Candance Jeoffrey SAILOR, PA-C at [facility]  REFERRING DIAG: M54.50 (ICD-10-CM) - Low back pain, unspecified  Rationale for Evaluation and Treatment: Rehabilitation  THERAPY DIAG:  Muscle weakness (generalized)  Other abnormalities of gait and mobility  Unsteadiness on feet  Other low back pain  Pain in right leg  ONSET DATE: 11/25/2023 (referral date)  SUBJECTIVE:                                                                                                                                                                                           SUBJECTIVE STATEMENT:  Pt reports her pain is about the same as at initial eval. Pt denies any falls or acute changes.  Pt reports she had trouble sleeping last night, tried to sleep on her stomach and it made the pain worse in her hip and down her R leg. Pt reports that her pain is usually ok in the morning then starts to hurt more throughout the day the longer she has been up and moving around.  From Eval: Pt enters clinic with [facility], husband waits in lobby. She reports that on 11/18/23 she turned the wrong way trying to get into bed and twisted or tweaked her back. She reports that initially the pain was localized to her R side low back but now is in her R hip, her R outer thigh, all the way down to her R knee. Her pain has improved since her initial injury except for the progression down her leg. She went to [facility] and reports they took xrays, showed she has scoliosis and degeneration in her spine. She reports that she feels the pain more if she turns too quickly or the wrong way, makes doing housework difficult. She reports that she can tolerate sleeping on her R or her L side, cannot tolerate sleeping on her back. Her pain does improve if she uses a walker or holds onto a shopping cart but she feels that the walker is overall cumbersome and prefers not to use it.   Of note, pt also reports that she has glaucoma and that her vision is  very impaired. She  also reports having memory issues.   PERTINENT HISTORY:  PMH: anxiety, DM, pacemaker, GERD, B glaucoma, HTN, RA, lumbar spinal stenosis, syncope  PAIN:  Are you having pain? Yes: NPRS scale: 7.5/10 Pain location: R lower back into RLE to her knee, hurts in R lateral thigh Pain description: radiates down R leg but no N/T Aggravating factors: standing and walking Relieving factors: sitting and laying down  PRECAUTIONS: Fall and ICD/Pacemaker  RED FLAGS: None   WEIGHT BEARING RESTRICTIONS: No  FALLS:  Has patient fallen in last 6 months? No  LIVING ENVIRONMENT: Lives with: lives with their spouse [name] Lives in: House/apartment Stairs: Yes: External: 3 steps; none Has following equipment at home: Single point cane, Walker - 2 wheeled, and transport chair  OCCUPATION: retired  PLOF: Independent with gait, Independent with transfers, and Requires assistive device for independence  PATIENT GOALS: been trying to do some therapy on my own in the bed before getting up and it has helped a bit - unable to state a specific goal  NEXT MD VISIT: just saw them again last week - unable to provide next appt date  OBJECTIVE:  Note: Objective measures were completed at Evaluation unless otherwise noted.  DIAGNOSTIC FINDINGS:  From referring provider note: reviewed/interpreted ap/lateral x-rays of lumbar spine showing degenerative scoliosis with multilevel disk degeneration, no fractures  PATIENT SURVEYS:  Oswestry: 30/50  COGNITION: Overall cognitive status: History of cognitive impairments - at baseline, memory impairments per patient     SENSATION: WFL  MUSCLE LENGTH: Hamstrings: tight HS  POSTURE: rounded shoulders, forward head, posterior pelvic tilt, and scoliosis  PALPATION: Not performed due to time constraints  LUMBAR ROM:   AROM eval  Flexion 50%, stretch on R side  Extension 50%, immediate pain down R side  Right lateral flexion 25%,  pain  Left lateral flexion 50%, stretch on R side  Right rotation 50%, pain  Left rotation 25%, major pain   (Blank rows = not tested)  LOWER EXTREMITY ROM:   WFL  Active  Right eval Left eval  Hip flexion    Hip extension    Hip abduction    Hip adduction    Hip internal rotation    Hip external rotation    Knee flexion    Knee extension    Ankle dorsiflexion    Ankle plantarflexion    Ankle inversion    Ankle eversion     (Blank rows = not tested)  LOWER EXTREMITY MMT:    MMT Right eval Left eval  Hip flexion 5 4  Hip extension    Hip abduction    Hip adduction    Hip internal rotation    Hip external rotation    Knee flexion 5 4  Knee extension 5 4  Ankle dorsiflexion 5 5  Ankle plantarflexion    Ankle inversion    Ankle eversion     (Blank rows = not tested)  LUMBAR SPECIAL TESTS:  Not performed due to time constraints  FUNCTIONAL TESTS:  Not performed due to time constraints  GAIT: Distance walked: various clinic distances Assistive device utilized: Single point cane Level of assistance: SBA Comments: antalgic, unsteady, to be further assessed in a functional context  TREATMENT DATE:    Self-Care/Home Management Continued discussion of benefits of using a RW for energy conservation and pain management with BUE support, pt more open to using a RW for community mobility at this time.  TherAct Slump Test inconclusive, has ongoing dull pain down  her R side that does not change with positioning MMT of R hip abd, unable to lift up against gravity, 2+/5 Palpation: Tightness in R QL, TTP in R piriformis  To address tight and painful muscles in R lower body: Seated piriformis stretch - feels more in hip adductors Supine SKTC - minimal stretch felt Supine SK across chest - feels more in hip adductors Seated butterfly stretch - no stretch felt in adductors, pain in R lateral thigh Supine piriformis stretch - 3 x 30 sec each Added to HEP, see bolded  below Standing IT band stretch - feels more in her R knee than in IT band Discussed lifting techniques Trashcan (changing the trash with soiled pads) - can change it more often so it is not as heavy Twisting at her waist, bending and getthing clothes out of washing machine and dryer   For initial assessment:  [facility] PT Assessment - 12/10/23 1424       Ambulation/Gait   Gait velocity 32.8 ft over 17.28 sec = 1.9 ft/sec   no AD     Standardized Balance Assessment   Standardized Balance Assessment Timed Up and Go Test;Five Times Sit to Stand    Five times sit to stand comments  29.47 sec   no UE     Timed Up and Go Test   TUG Normal TUG    Normal TUG (seconds) 21.34   no AD                                                                                                                       PATIENT EDUCATION:  Education details: see above under Self-Care, initial HEP, results of OM and functional implications Person educated: Patient Education method: Explanation, Demonstration, Tactile cues, Verbal cues, and Handouts Education comprehension: verbalized understanding, returned demonstration, and needs further education  HOME EXERCISE PROGRAM: Access Code: [email or web address] URL: [email or web address] Date: 12/10/2023 Prepared by: Waddell Southgate  Exercises - Supine Figure 4 Piriformis Stretch  - 1 x daily - 7 x weekly - 1 sets - 3-5 reps - 30 sec hold  ASSESSMENT:  CLINICAL IMPRESSION: Emphasis of skilled PT session on trialing various stretches in order to address ongoing R sided low back and LE pain, assessing various outcome measures to further determine fall risk and areas of weakness, and continuing to discuss recommendation of use of RW for energy conservation and pain management. Pt only feels stretch with supine piriformis stretch, otherwise she feels stretch in antagonist muscle or has increase in pain. She does exhibit impaired functional LE strength and is a  high fall risk based on her 5xSTS score of 29.47 sec, gait speed of 1.9 ft/sec, and TUG score of 21.34 sec. She continues to benefit from skilled PT services in order to decrease her pain and improve her balance and independence and safety with functional mobility. Continue POC.    OBJECTIVE IMPAIRMENTS: Abnormal gait, decreased cognition, decreased knowledge of condition, decreased knowledge of use of DME, decreased mobility, difficulty  walking, decreased ROM, decreased strength, impaired vision/preception, improper body mechanics, postural dysfunction, and pain.   ACTIVITY LIMITATIONS: carrying, lifting, bending, sitting, standing, squatting, stairs, transfers, and bed mobility  PARTICIPATION LIMITATIONS: meal prep, cleaning, laundry, driving, and community activity  PERSONAL FACTORS: Age and 3+ comorbidities:  anxiety, DM, pacemaker, GERD, B glaucoma, HTN, RA, lumbar spinal stenosis, syncopeare also affecting patient's functional outcome.   REHAB POTENTIAL: Good  CLINICAL DECISION MAKING: Evolving/moderate complexity  EVALUATION COMPLEXITY: Moderate   GOALS: Goals reviewed with patient? Yes  SHORT TERM GOALS: Target date: 12/23/2023   Pt will be independent with initial HEP for improved strength, balance, transfers and gait and increased independence with management of her pain symptoms. Baseline: Goal status: INITIAL  2.  Pt will improve 5 x STS to less than or equal to 26 sec seconds to demonstrate improved functional strength and transfer efficiency.  Baseline: 29.47 sec no UE (8/13) Goal status: INITIAL  3.  Pt will improve normal TUG to less than or equal to 18 seconds for improved functional mobility and decreased fall risk. Baseline: 21.34 sec no AD (8/13) Goal status: INITIAL  4.  Pt will improve gait velocity to at least 2.25 ft/sec for improved gait efficiency and performance at mod I level  Baseline: 1.9 ft/sec no AD mod I (8/13) Goal status: INITIAL  5.  FGA to  be assessed and LTG set if appropriate Baseline:  Goal status: INITIAL   LONG TERM GOALS: Target date: 01/13/2024   Pt will be independent with final HEP for improved strength, balance, transfers and gait and increased independence with management of her pain symptoms. Baseline:  Goal status: INITIAL  2.  Pt will improve 5 x STS to less than or equal to 23 sec seconds to demonstrate improved functional strength and transfer efficiency.  Baseline: 29.47 sec no UE (8/13) Goal status: INITIAL  3.  Pt will improve normal TUG to less than or equal to 15 seconds for improved functional mobility and decreased fall risk. Baseline: 21.34 sec no AD (8/13) Goal status: INITIAL  4.  Pt will improve gait velocity to at least 2.25 ft/sec for improved gait efficiency and performance at mod I level  Baseline: 1.9 ft/sec no AD mod I (8/13) Goal status: INITIAL  5.  FGA to be assessed and LTG set if appropriate Baseline:  Goal status: INITIAL  6.  Pt will improve her lumbar ROM by 25% in limited direction with no increase in pain. Baseline: see eval Goal status: INITIAL  7.  Pt will improve her score on the Oswestry to </= 24/50 to demonstrate decreased disability level. Baseline: 30/50 - severe disability (8/4) Goal status: INITIAL  PLAN:  PT FREQUENCY: 1-2x/week  PT DURATION: 6 weeks  PLANNED INTERVENTIONS: 02835- PT Re-evaluation, 97750- Physical Performance Testing, 97110-Therapeutic exercises, 97530- Therapeutic activity, 97112- Neuromuscular re-education, 97535- Self Care, 02859- Manual therapy, [phone]- Gait training, [phone]- Aquatic Therapy, [phone] (1-2 muscles), [phone] (3+ muscles)- Dry Needling, Patient/Family education, Balance training, Stair training, Taping, Joint mobilization, Spinal mobilization, Cognitive remediation, DME instructions, Cryotherapy, and Moist heat.  PLAN FOR NEXT SESSION: Berg/FGA and set LTG; add to HEP to work on core strengthening/stability, work on Teacher, English as a foreign language impairments, trial RW and rollator, lifting techniques   Waddell Southgate, PT Waddell Southgate, PT, DPT, CSRS  12/10/2023, 2:25 PM

## 2023-12-11 ENCOUNTER — Ambulatory Visit (INDEPENDENT_AMBULATORY_CARE_PROVIDER_SITE_OTHER): Admitting: Obstetrics and Gynecology

## 2023-12-11 ENCOUNTER — Other Ambulatory Visit (HOSPITAL_COMMUNITY)
Admission: RE | Admit: 2023-12-11 | Discharge: 2023-12-11 | Disposition: A | Discharge status: Home / Self Care | Source: Ambulatory Visit | Attending: Obstetrics and Gynecology | Admitting: Obstetrics and Gynecology

## 2023-12-11 ENCOUNTER — Encounter: Payer: Self-pay | Admitting: Obstetrics and Gynecology

## 2023-12-11 VITALS — BP 164/76 | HR 62

## 2023-12-11 DIAGNOSIS — N898 Other specified noninflammatory disorders of vagina: Secondary | ICD-10-CM | POA: Insufficient documentation

## 2023-12-11 DIAGNOSIS — N952 Postmenopausal atrophic vaginitis: Secondary | ICD-10-CM

## 2023-12-11 DIAGNOSIS — N3281 Overactive bladder: Secondary | ICD-10-CM | POA: Diagnosis not present

## 2023-12-11 MED ORDER — GEMTESA 75 MG PO TABS: 1.0000 | ORAL_TABLET | Freq: Every day | ORAL | 3 refills | Status: AC

## 2023-12-11 MED ORDER — CLOBETASOL PROPIONATE E 0.05 % EX CREA
0.5000 g | TOPICAL_CREAM | CUTANEOUS | 11 refills | Status: DC
Start: 2023-12-11 — End: 2023-12-12

## 2023-12-11 MED ORDER — ESTRADIOL 0.1 MG/GM VA CREA: 0.5000 g | TOPICAL_CREAM | VAGINAL | 11 refills | Status: DC

## 2023-12-11 NOTE — Patient Instructions (Signed)
 Use steroid cream twice a week on the vulva Use the estradiol  vaginal cream on the vulva on alternate days twice a week.  Try the gemtesa  twice a day- if this helps, then call us  and let us  know so we can change your prescription.

## 2023-12-11 NOTE — Progress Notes (Signed)
 [facility] Return Visit  SUBJECTIVE  History of Present Illness: Charlene Barker is a 78 y.o. female seen in follow-up for overactive bladder. She is on Gemtesa  79mg .   She feels the medication helps some but still has leakage throughout the day. During the day,1 cup decaf coffee, water, 1 glass tea.  Previously tried: vesicare, toviaz, oxybutynin, myrbetriq  for her symptoms.    She feels that her urethral area is swollen and itchy. She has not been using estradiol  cream. She has been seeing Charlene Barker and was prescribed a steroid cream but she is not sure she is using it correctly or putting it in the correct place. Unsure of the name of the cream. She also just finished a course of fluconazole.   She hurt her back recently and is going to PT. Has a lot of procedures scheduled (with eyes and dental).    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   Past Surgical History: She  has a past surgical history that includes Bladder surgery; loop recorder implant (N/A, 09/20/2011); permanent pacemaker insertion (N/A, 04/15/2012); Eye surgery; LEAD REVISION/REPAIR (N/A, 05/14/2023); PPM GENERATOR CHANGEOUT (N/A, 05/14/2023); Esophagogastroduodenoscopy (egd) with propofol  (N/A, 07/25/2023); Colonoscopy with propofol  (N/A, 07/25/2023); Biopsy of skin subcutaneous tissue and/or mucous membrane (07/25/2023);  polypectomy (07/25/2023); and Hot hemostasis (N/A, 07/25/2023).   Medications: She has a current medication list which includes the following prescription(s): clobetasol  propionate e, estradiol , acetaminophen , vitamin c, aspirin  ec, b complex vitamins, cholecalciferol, cyclosporine, docusate sodium , dorzolamide , doxycycline , latanoprost , lorazepam, losartan , losartan , macrobid, timolol , tramadol , and gemtesa .   Allergies: Patient is allergic to cipro [ciprofloxacin hcl], cleocin [clindamycin], codeine, contrast media [iodinated contrast media], metformin, penicillins, sulfa drugs cross reactors, sulfur dioxide, wound dressing adhesive, hibiclens  [chlorhexidine  gluconate], latex, and povidone.   Social History: Patient  reports that she has never smoked. She has never used smokeless tobacco. She reports that she does not currently use alcohol . She reports that she does not use drugs.      OBJECTIVE     Physical Exam: Vitals:   12/11/23 0831  BP: (!) 164/76  Pulse: 62    Gen: No apparent distress, A&O x 3.  Detailed Urogynecologic Evaluation:  Normal urethral meatus with small caruncle. 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 Aptima swab obtained.   Prior exam showed: POP-Q:    POP-Q   -2.5                                            Aa   -2.5                                           Ba   -6  C    2                                            Gh   2.5                                            Pb   7.5                                            tvl    -2                                            Ap   -2                                            Bp   -7.5                                              D      ASSESSMENT AND PLAN    Charlene Barker is a 78 y.o. with:  1. Vaginal atrophy   2. Overactive bladder   3. Vaginal itching     - Continue with Gemtesa   daily, refill provided - No interested in Botox or sacral nerve stimulation at this time.  - She is not sure she has a steroid cream so prescirbed clobetasol  to be used twice a weekly. Then use estradiol  vaginal cream twice weekly. Provided with vulvar diagrams for placement of creams.  - aptima swab sent to r/o yeast infection  Follow up 1 year or sooner if needed  Charlene LOISE Caper, MD

## 2023-12-12 ENCOUNTER — Other Ambulatory Visit: Payer: Self-pay

## 2023-12-12 DIAGNOSIS — N952 Postmenopausal atrophic vaginitis: Secondary | ICD-10-CM

## 2023-12-12 LAB — CERVICOVAGINAL ANCILLARY ONLY
Bacterial Vaginitis (gardnerella): NEGATIVE
Candida Glabrata: NEGATIVE
Candida Vaginitis: NEGATIVE
Comment: NEGATIVE
Comment: NEGATIVE
Comment: NEGATIVE

## 2023-12-12 MED ORDER — CLOBETASOL PROPIONATE E 0.05 % EX CREA: 0.5000 g | TOPICAL_CREAM | CUTANEOUS | 11 refills | Status: AC

## 2023-12-12 MED ORDER — ESTRADIOL 0.1 MG/GM VA CREA: 0.5000 g | TOPICAL_CREAM | VAGINAL | 11 refills | Status: DC

## 2023-12-12 NOTE — Telephone Encounter (Signed)
 Patient called stating she needs her medication sent to [facility] on [address] so she can receive them faster than [facility]. I have sent them to requested pharmacy.

## 2023-12-18 ENCOUNTER — Ambulatory Visit: Admitting: Physical Therapy

## 2023-12-18 DIAGNOSIS — R2689 Other abnormalities of gait and mobility: Secondary | ICD-10-CM | POA: Diagnosis not present

## 2023-12-18 DIAGNOSIS — M5459 Other low back pain: Secondary | ICD-10-CM | POA: Diagnosis not present

## 2023-12-18 DIAGNOSIS — R2681 Unsteadiness on feet: Secondary | ICD-10-CM | POA: Diagnosis not present

## 2023-12-18 DIAGNOSIS — M6281 Muscle weakness (generalized): Secondary | ICD-10-CM

## 2023-12-18 DIAGNOSIS — M79604 Pain in right leg: Secondary | ICD-10-CM

## 2023-12-18 NOTE — Therapy (Signed)
 OUTPATIENT PHYSICAL THERAPY THORACOLUMBAR TREATMENT   Patient Name: Charlene Barker MRN: [id number] DOB:[birth year], 78 y.o., female Today's Date: 12/18/2023  END OF SESSION:  PT End of Session - 12/18/23 1324     Visit Number 3    Number of Visits 13   recert   Date for PT Re-Evaluation 01/26/24    Authorization Type Medicare    PT Start Time 1324   pt arrived late, then in restroom   PT Stop Time 1402    PT Time Calculation (min) 38 min    Equipment Utilized During Treatment Gait belt    Activity Tolerance Patient tolerated treatment well    Behavior During Therapy WFL for tasks assessed/performed            Past Medical History:  Diagnosis Date   Anxiety    Atrioventricular block, complete -intermittent        Complete heart block (HCC) 04/14/2012   Diabetes mellitus without complication (HCC)    per patient.    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   Pacemaker    St Jude   Paresthesia of both legs 03/02/2015   Rheumatoid arthritis (HCC)    Sinus node dysfunction (HCC) 02/17/2019   Spinal stenosis of lumbar region 03/02/2015   L4-5   Syncope    Past Surgical History:  Procedure Laterality Date   BIOPSY OF SKIN SUBCUTANEOUS TISSUE AND/OR MUCOUS MEMBRANE  07/25/2023   Procedure: BIOPSY, gastric;  Surgeon: Rollin Dover, MD;  Location: [facility];  Service: Gastroenterology;;   BLADDER SURGERY     sling   COLONOSCOPY WITH PROPOFOL  N/A 07/25/2023   Procedure: COLONOSCOPY WITH PROPOFOL ;  Surgeon: Rollin Dover, MD;  Location: [facility];  Service: Gastroenterology;  Laterality: N/A;   ESOPHAGOGASTRODUODENOSCOPY (EGD) WITH PROPOFOL  N/A 07/25/2023   Procedure: ESOPHAGOGASTRODUODENOSCOPY (EGD)  WITH PROPOFOL ;  Surgeon: Rollin Dover, MD;  Location: [facility];  Service: Gastroenterology;  Laterality: N/A;   EYE SURGERY     several, bilateral   HOT HEMOSTASIS N/A 07/25/2023   Procedure: , WITH ARGON PLASMA COAGULATION;  Surgeon: Rollin Dover, MD;  Location: [facility];  Service: Gastroenterology;  Laterality: N/A;   LEAD REVISION/REPAIR N/A 05/14/2023   Procedure: LEAD REVISION/REPAIR;  Surgeon: Nancey, Eulas BRAVO, MD;  Location: [facility];  Service: Cardiovascular;  Laterality: N/A;   LOOP RECORDER IMPLANT N/A 09/20/2011   Procedure: LOOP RECORDER IMPLANT;  Surgeon: Elspeth JAYSON Sage, MD;  Location[facility];  Service: Cardiovascular;  Laterality: N/A;   PERMANENT PACEMAKER INSERTION N/A 04/15/2012   POLYPECTOMY  07/25/2023   Procedure: POLYPECTOMY;  Surgeon: Rollin Dover, MD;  Location: [facility];  Service: Gastroenterology;;   OSCAR GENERATOR CHANGEOUT N/A 05/14/2023   Procedure: PPM GENERATOR CHANGEOUT;  Surgeon: Nancey Eulas BRAVO, MD;  Location: [facility];  Service: Cardiovascular;  Laterality: N/A;   Patient Active Problem List   Diagnosis Date Noted   Sensorineural hearing loss, bilateral 07/08/2023   Chronic rhinitis 07/08/2023   Deviated nasal septum 07/08/2023   Hypertrophy of nasal turbinates 07/08/2023   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   Near syncope 05/13/2023   Sinus node dysfunction (HCC) 02/17/2019   Palpitations  02/17/2019   Encounter for care of pacemaker 10/27/2018   Tremor 07/18/2016   Paresthesia 03/02/2015   Spinal stenosis of lumbar region 03/02/2015   Other specified transient cerebral ischemias 12/27/2014   Mechanical complication of cardiac pacemaker electrode 08/27/2013   Anxiety 08/13/2012   Pacemaker--St Jude Acc[employer] DR 2110 dual chamber pacemaker12/18/2013 04/15/2012   Complete heart block (HCC) 04/14/2012   Syncope    Left bundle branch block    Dizziness     PCP: Shayne Anes,  MD  REFERRING PROVIDER: Candance Jeoffrey SAILOR, PA-C at [facility]  REFERRING DIAG: M54.50 (ICD-10-CM) - Low back pain, unspecified  Rationale for Evaluation and Treatment: Rehabilitation  THERAPY DIAG:  Muscle weakness (generalized)  Other abnormalities of gait and mobility  Unsteadiness on feet  Pain in right leg  Other low back pain  ONSET DATE: 11/25/2023 (referral date)  SUBJECTIVE:                                                                                                                                                                                           SUBJECTIVE STATEMENT:  Pt reports her pain is about the same as at initial eval. Pt denies any falls or acute changes.  ***pt reports she is slow today, enters appointment with her Rw and states she woulnt have been able to make it without it Pain is still there, has not changed Now her R foot is getting numb and tingly, feels like this has gotten worse R ear is also stopped up and has been since this started  Numbness starts laterally next to R knee then down side of R lower leg and entire R foot Trying to do anything makes the pain more severe, exercises only give temporary relief of her pain; things at home she has to do and can't just sit around and not doing anything  Pt had a near fall going up steps, was able to catch herself with rails  Goes back to [facility] on 12/23/23  From Eval: Pt enters clinic with [facility], husband waits in lobby. She reports that on 11/18/23 she turned the wrong way trying to get into bed and twisted or tweaked her back. She reports that initially the pain was localized to her R side low back but now is in her R hip, her R outer thigh, all the way down to her R knee. Her pain has improved since her initial injury except for the progression down her leg. She went to [facility] and reports they took xrays, showed she has scoliosis and degeneration in her spine. She reports that she feels  the pain  more if she turns too quickly or the wrong way, makes doing housework difficult. She reports that she can tolerate sleeping on her R or her L side, cannot tolerate sleeping on her back. Her pain does improve if she uses a walker or holds onto a shopping cart but she feels that the walker is overall cumbersome and prefers not to use it.   Of note, pt also reports that she has glaucoma and that her vision is very impaired. She also reports having memory issues.   PERTINENT HISTORY:  PMH: anxiety, DM, pacemaker, GERD, B glaucoma, HTN, RA, lumbar spinal stenosis, syncope  PAIN:  Are you having pain? Yes: NPRS scale: 7.5/10 Pain location: R lower back into RLE to her knee, hurts in R lateral thigh Pain description: radiates down R leg but no N/T Aggravating factors: standing and walking Relieving factors: sitting and laying down  PRECAUTIONS: Fall and ICD/Pacemaker  RED FLAGS: None   WEIGHT BEARING RESTRICTIONS: No  FALLS:  Has patient fallen in last 6 months? No  LIVING ENVIRONMENT: Lives with: lives with their spouse [name] Lives in: House/apartment Stairs: Yes: External: 3 steps; none Has following equipment at home: Single point cane, Walker - 2 wheeled, and transport chair  OCCUPATION: retired  PLOF: Independent with gait, Independent with transfers, and Requires assistive device for independence  PATIENT GOALS: been trying to do some therapy on my own in the bed before getting up and it has helped a bit - unable to state a specific goal  NEXT MD VISIT: just saw them again last week - unable to provide next appt date  OBJECTIVE:  Note: Objective measures were completed at Evaluation unless otherwise noted.  DIAGNOSTIC FINDINGS:  From referring provider note: reviewed/interpreted ap/lateral x-rays of lumbar spine showing degenerative scoliosis with multilevel disk degeneration, no fractures  PATIENT SURVEYS:  Oswestry: 30/50  COGNITION: Overall cognitive  status: History of cognitive impairments - at baseline, memory impairments per patient     SENSATION: WFL  MUSCLE LENGTH: Hamstrings: tight HS  POSTURE: rounded shoulders, forward head, posterior pelvic tilt, and scoliosis  PALPATION: Not performed due to time constraints  LUMBAR ROM:   AROM eval  Flexion 50%, stretch on R side  Extension 50%, immediate pain down R side  Right lateral flexion 25%, pain  Left lateral flexion 50%, stretch on R side  Right rotation 50%, pain  Left rotation 25%, major pain   (Blank rows = not tested)  LOWER EXTREMITY ROM:   WFL  Active  Right eval Left eval  Hip flexion    Hip extension    Hip abduction    Hip adduction    Hip internal rotation    Hip external rotation    Knee flexion    Knee extension    Ankle dorsiflexion    Ankle plantarflexion    Ankle inversion    Ankle eversion     (Blank rows = not tested)  LOWER EXTREMITY MMT:    MMT Right eval Left eval  Hip flexion 5 4  Hip extension    Hip abduction    Hip adduction    Hip internal rotation    Hip external rotation    Knee flexion 5 4  Knee extension 5 4  Ankle dorsiflexion 5 5  Ankle plantarflexion    Ankle inversion    Ankle eversion     (Blank rows = not tested)  LUMBAR SPECIAL TESTS:  Not performed due to time constraints  FUNCTIONAL TESTS:  Not performed due to time constraints  GAIT: Distance walked: various clinic distances Assistive device utilized: Single point cane Level of assistance: SBA Comments: antalgic, unsteady, to be further assessed in a functional context  TREATMENT DATE:    Self-Care/Home Management Continued discussion of benefits of using a RW for energy conservation and pain management with BUE support, pt more open to using a RW for community mobility at this time.***  TherAct Appears to have numbness along sural nerve distribution or superficial peroneal No tenderness to palpation in posterior thigh region but does get  pain here when activating muscles Seated R HS stretch 3 x 30 sec EOM Onset of LBP Long-sitting B HS stretch in chair x 30 sec Increases LBP due to decreased back support Transitioned to use of strap This increases tingling in her R foot Supine therex to address ongoing pain and tightness: LTR x 5 reps to L side only, 30 sec hold Increase in pain with LTR to the R Added to HEP, see bolded below                                                                                                                 PATIENT EDUCATION:  Education details: see above under Self-Care, initial HEP, results of OM and functional implications*** Person educated: Patient Education method: Explanation, Demonstration, Tactile cues, Verbal cues, and Handouts Education comprehension: verbalized understanding, returned demonstration, and needs further education  HOME EXERCISE PROGRAM: Access Code: [email or web address] URL: [email or web address] Date: 12/10/2023 Prepared by: Waddell Southgate  Exercises - Supine Figure 4 Piriformis Stretch  - 1 x daily - 7 x weekly - 1 sets - 3-5 reps - 30 sec hold - Supine Lower Trunk Rotation  - 1 x daily - 7 x weekly - 1 sets - 3-5 reps - 30 sec hold  ASSESSMENT:  CLINICAL IMPRESSION: Emphasis of skilled PT session on*** trialing various stretches in order to address ongoing R sided low back and LE pain, assessing various outcome measures to further determine fall risk and areas of weakness, and continuing to discuss recommendation of use of RW for energy conservation and pain management. Pt only feels stretch with supine piriformis stretch, otherwise she feels stretch in antagonist muscle or has increase in pain. She does exhibit impaired functional LE strength and is a high fall risk based on her 5xSTS score of 29.47 sec, gait speed of 1.9 ft/sec, and TUG score of 21.34 sec. She continues to benefit from skilled PT services in order to decrease her pain and improve her  balance and independence and safety with functional mobility. Continue POC.    OBJECTIVE IMPAIRMENTS: Abnormal gait, decreased cognition, decreased knowledge of condition, decreased knowledge of use of DME, decreased mobility, difficulty walking, decreased ROM, decreased strength, impaired vision/preception, improper body mechanics, postural dysfunction, and pain.   ACTIVITY LIMITATIONS: carrying, lifting, bending, sitting, standing, squatting, stairs, transfers, and bed mobility  PARTICIPATION LIMITATIONS: meal prep, cleaning, laundry, driving, and community activity  PERSONAL FACTORS: Age and 3+ comorbidities:  anxiety, DM, pacemaker,  GERD, B glaucoma, HTN, RA, lumbar spinal stenosis, syncopeare also affecting patient's functional outcome.   REHAB POTENTIAL: Good  CLINICAL DECISION MAKING: Evolving/moderate complexity  EVALUATION COMPLEXITY: Moderate   GOALS: Goals reviewed with patient? Yes  SHORT TERM GOALS: Target date: 12/23/2023   Pt will be independent with initial HEP for improved strength, balance, transfers and gait and increased independence with management of her pain symptoms. Baseline: Goal status: INITIAL  2.  Pt will improve 5 x STS to less than or equal to 26 sec seconds to demonstrate improved functional strength and transfer efficiency.  Baseline: 29.47 sec no UE (8/13) Goal status: INITIAL  3.  Pt will improve normal TUG to less than or equal to 18 seconds for improved functional mobility and decreased fall risk. Baseline: 21.34 sec no AD (8/13) Goal status: INITIAL  4.  Pt will improve gait velocity to at least 2.25 ft/sec for improved gait efficiency and performance at mod I level  Baseline: 1.9 ft/sec no AD mod I (8/13) Goal status: INITIAL  5.  FGA to be assessed and LTG set if appropriate*** Baseline:  Goal status: INITIAL   LONG TERM GOALS: Target date: 01/13/2024   Pt will be independent with final HEP for improved strength, balance,  transfers and gait and increased independence with management of her pain symptoms. Baseline:  Goal status: INITIAL  2.  Pt will improve 5 x STS to less than or equal to 23 sec seconds to demonstrate improved functional strength and transfer efficiency.  Baseline: 29.47 sec no UE (8/13) Goal status: INITIAL  3.  Pt will improve normal TUG to less than or equal to 15 seconds for improved functional mobility and decreased fall risk. Baseline: 21.34 sec no AD (8/13) Goal status: INITIAL  4.  Pt will improve gait velocity to at least 2.25 ft/sec for improved gait efficiency and performance at mod I level  Baseline: 1.9 ft/sec no AD mod I (8/13) Goal status: INITIAL  5.  FGA to be assessed and LTG set if appropriate Baseline:  Goal status: INITIAL  6.  Pt will improve her lumbar ROM by 25% in limited direction with no increase in pain. Baseline: see eval Goal status: INITIAL  7.  Pt will improve her score on the Oswestry to </= 24/50 to demonstrate decreased disability level. Baseline: 30/50 - severe disability (8/4) Goal status: INITIAL  PLAN:  PT FREQUENCY: 1-2x/week  PT DURATION: 6 weeks  PLANNED INTERVENTIONS: 02835- PT Re-evaluation, 97750- Physical Performance Testing, 97110-Therapeutic exercises, 97530- Therapeutic activity, 97112- Neuromuscular re-education, 97535- Self Care, 02859- Manual therapy, [phone]- Gait training, [phone]- Aquatic Therapy, [phone] (1-2 muscles), [phone] (3+ muscles)- Dry Needling, Patient/Family education, Balance training, Stair training, Taping, Joint mobilization, Spinal mobilization, Cognitive remediation, DME instructions, Cryotherapy, and Moist heat.  PLAN FOR NEXT SESSION: Berg/FGA and set LTG; add to HEP to work on core strengthening/stability, work on Astronomer impairments, trial RW and rollator, lifting techniques***   Waddell Southgate, PT Waddell Southgate, PT, DPT, CSRS  12/18/2023, 2:03 PM

## 2023-12-19 ENCOUNTER — Telehealth: Payer: Self-pay | Admitting: Obstetrics and Gynecology

## 2023-12-19 NOTE — Telephone Encounter (Signed)
 Patient calling in today to see if her test results have come in from her last visit.

## 2023-12-19 NOTE — Telephone Encounter (Signed)
 Please let patient know that her swab was negative for infection.

## 2023-12-19 NOTE — Telephone Encounter (Signed)
 Patient notified and aware. No additional questions at this time

## 2023-12-22 ENCOUNTER — Ambulatory Visit: Admitting: Physical Therapy

## 2023-12-22 DIAGNOSIS — R2681 Unsteadiness on feet: Secondary | ICD-10-CM

## 2023-12-22 DIAGNOSIS — M6281 Muscle weakness (generalized): Secondary | ICD-10-CM

## 2023-12-22 DIAGNOSIS — M79604 Pain in right leg: Secondary | ICD-10-CM | POA: Diagnosis not present

## 2023-12-22 DIAGNOSIS — M5459 Other low back pain: Secondary | ICD-10-CM

## 2023-12-22 DIAGNOSIS — R2689 Other abnormalities of gait and mobility: Secondary | ICD-10-CM | POA: Diagnosis not present

## 2023-12-22 NOTE — Therapy (Signed)
 OUTPATIENT PHYSICAL THERAPY THORACOLUMBAR TREATMENT   Patient Name: Charlene Barker MRN: [id number] DOB:[birth year], 78 y.o., female Today's Date: 12/22/2023  END OF SESSION:  PT End of Session - 12/22/23 1023     Visit Number 4    Number of Visits 13   recert   Date for PT Re-Evaluation 01/26/24    Authorization Type Medicare    PT Start Time 1020   pt arrived late   PT Stop Time 1104    PT Time Calculation (min) 44 min    Equipment Utilized During Treatment Gait belt    Activity Tolerance Patient tolerated treatment well    Behavior During Therapy WFL for tasks assessed/performed             Past Medical History:  Diagnosis Date   Anxiety    Atrioventricular block, complete -intermittent        Complete heart block (HCC) 04/14/2012   Diabetes mellitus without complication (HCC)    per patient.    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   Pacemaker    St Jude   Paresthesia of both legs 03/02/2015   Rheumatoid arthritis (HCC)    Sinus node dysfunction (HCC) 02/17/2019   Spinal stenosis of lumbar region 03/02/2015   L4-5   Syncope    Past Surgical History:  Procedure Laterality Date   BIOPSY OF SKIN SUBCUTANEOUS TISSUE AND/OR MUCOUS MEMBRANE  07/25/2023   Procedure: BIOPSY, gastric;  Surgeon: Rollin Dover, MD;  Location: [facility];  Service: Gastroenterology;;   BLADDER SURGERY     sling   COLONOSCOPY WITH PROPOFOL  N/A 07/25/2023   Procedure: COLONOSCOPY WITH PROPOFOL ;  Surgeon: Rollin Dover, MD;  Location: [facility];  Service: Gastroenterology;  Laterality: N/A;   ESOPHAGOGASTRODUODENOSCOPY (EGD) WITH PROPOFOL  N/A 07/25/2023   Procedure: ESOPHAGOGASTRODUODENOSCOPY (EGD) WITH PROPOFOL ;   Surgeon: Rollin Dover, MD;  Location: [facility];  Service: Gastroenterology;  Laterality: N/A;   EYE SURGERY     several, bilateral   HOT HEMOSTASIS N/A 07/25/2023   Procedure: , WITH ARGON PLASMA COAGULATION;  Surgeon: Rollin Dover, MD;  Location: [facility];  Service: Gastroenterology;  Laterality: N/A;   LEAD REVISION/REPAIR N/A 05/14/2023   Procedure: LEAD REVISION/REPAIR;  Surgeon: Nancey, Eulas BRAVO, MD;  Location: [facility];  Service: Cardiovascular;  Laterality: N/A;   LOOP RECORDER IMPLANT N/A 09/20/2011   Procedure: LOOP RECORDER IMPLANT;  Surgeon: Elspeth JAYSON Sage, MD;  Location: [facility] C[facility];  Service: Cardiovascular;  Laterality: N/A;   PERMANENT PACEMAKER INSERTION N/A 04/15/2012   POLYPECTOMY  07/25/2023   Procedure: POLYPECTOMY;  Surgeon: Rollin Dover, MD;  Location: [facility];  Service: Gastroenterology;;   OSCAR GENERATOR CHANGEOUT N/A 05/14/2023   Procedure: PPM GENERATOR CHANGEOUT;  Surgeon: Nancey Eulas BRAVO, MD;  Location: [facility];  Service: Cardiovascular;  Laterality: N/A;   Patient Active Problem List   Diagnosis Date Noted   Sensorineural hearing loss, bilateral 07/08/2023   Chronic rhinitis 07/08/2023   Deviated nasal septum 07/08/2023   Hypertrophy of nasal turbinates 07/08/2023   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   Near syncope 05/13/2023   Sinus node dysfunction (HCC) 02/17/2019   Palpitations 02/17/2019  Encounter for care of pacemaker 10/27/2018   Tremor 07/18/2016   Paresthesia 03/02/2015   Spinal stenosis of lumbar region 03/02/2015   Other specified transient cerebral ischemias 12/27/2014   Mechanical complication of cardiac pacemaker electrode 08/27/2013   Anxiety 08/13/2012   Pacemaker--St Jude Accent DR 2110 dual chamber pacemaker12/18/2013 04/15/2012   Complete heart block (HCC) 04/14/2012   Syncope    Left bundle branch block    Dizziness     PCP: Shayne Anes, MD  REFERRING  PROVIDER: Candance Jeoffrey SAILOR, PA-C at [facility]  REFERRING DIAG: M54.50 (ICD-10-CM) - Low back pain, unspecified  Rationale for Evaluation and Treatment: Rehabilitation  THERAPY DIAG:  Muscle weakness (generalized)  Other abnormalities of gait and mobility  Unsteadiness on feet  Pain in right leg  Other low back pain  ONSET DATE: 11/25/2023 (referral date)  SUBJECTIVE:                                                                                                                                                                                           SUBJECTIVE STATEMENT:   Pt reports that she is feeling rushed this morning, her husband has PT right after her session and he has a hard time getting around first thing in the morning. Pt reports that her pain is a little bit better this morning, some days it is full force and other days it is better. She reports that the numbness in her R foot continues, she is unable to state what specifically brings on the numbness.. She continues to use her RW this morning.  Pain 4/10 at rest today, it was more this morning when she was up trying to get ready for the day, worse if she doesn't use the walker.  She goes back to [facility] on 12/23/23.  From Eval: Pt enters clinic with [facility], husband waits in lobby. She reports that on 11/18/23 she turned the wrong way trying to get into bed and twisted or tweaked her back. She reports that initially the pain was localized to her R side low back but now is in her R hip, her R outer thigh, all the way down to her R knee. Her pain has improved since her initial injury except for the progression down her leg. She went to [facility] and reports they took xrays, showed she has scoliosis and degeneration in her spine. She reports that she feels the pain more if she turns too quickly or the wrong way, makes doing housework difficult. She reports that she can tolerate sleeping on her R or her L side, cannot  tolerate sleeping on her back.  Her pain does improve if she uses a walker or holds onto a shopping cart but she feels that the walker is overall cumbersome and prefers not to use it.   Of note, pt also reports that she has glaucoma and that her vision is very impaired. She also reports having memory issues.   PERTINENT HISTORY:  PMH: anxiety, DM, pacemaker, GERD, B glaucoma, HTN, RA, lumbar spinal stenosis, syncope  PAIN:  Are you having pain? Yes: NPRS scale: 4/10 Pain location: R lower back into RLE to her knee, hurts in R lateral thigh Pain description: radiates down R leg but no N/T Aggravating factors: standing and walking Relieving factors: sitting and laying down  PRECAUTIONS: Fall and ICD/Pacemaker  RED FLAGS: None   WEIGHT BEARING RESTRICTIONS: No  FALLS:  Has patient fallen in last 6 months? No  LIVING ENVIRONMENT: Lives with: lives with their spouse [name] Lives in: House/apartment Stairs: Yes: External: 3 steps; none Has following equipment at home: Single point cane, Walker - 2 wheeled, and transport chair  OCCUPATION: retired  PLOF: Independent with gait, Independent with transfers, and Requires assistive device for independence  PATIENT GOALS: been trying to do some therapy on my own in the bed before getting up and it has helped a bit - unable to state a specific goal  NEXT MD VISIT: just saw them again last week - unable to provide next appt date  OBJECTIVE:  Note: Objective measures were completed at Evaluation unless otherwise noted.  DIAGNOSTIC FINDINGS:  From referring provider note: reviewed/interpreted ap/lateral x-rays of lumbar spine showing degenerative scoliosis with multilevel disk degeneration, no fractures  PATIENT SURVEYS:  Oswestry: 30/50  COGNITION: Overall cognitive status: History of cognitive impairments - at baseline, memory impairments per patient     SENSATION: WFL  MUSCLE LENGTH: Hamstrings: tight HS  POSTURE:  rounded shoulders, forward head, posterior pelvic tilt, and scoliosis  PALPATION: Not performed due to time constraints  LUMBAR ROM:   AROM eval  Flexion 50%, stretch on R side  Extension 50%, immediate pain down R side  Right lateral flexion 25%, pain  Left lateral flexion 50%, stretch on R side  Right rotation 50%, pain  Left rotation 25%, major pain   (Blank rows = not tested)  LOWER EXTREMITY ROM:   WFL  Active  Right eval Left eval  Hip flexion    Hip extension    Hip abduction    Hip adduction    Hip internal rotation    Hip external rotation    Knee flexion    Knee extension    Ankle dorsiflexion    Ankle plantarflexion    Ankle inversion    Ankle eversion     (Blank rows = not tested)  LOWER EXTREMITY MMT:    MMT Right eval Left eval  Hip flexion 5 4  Hip extension    Hip abduction    Hip adduction    Hip internal rotation    Hip external rotation    Knee flexion 5 4  Knee extension 5 4  Ankle dorsiflexion 5 5  Ankle plantarflexion    Ankle inversion    Ankle eversion     (Blank rows = not tested)  LUMBAR SPECIAL TESTS:  Not performed due to time constraints  FUNCTIONAL TESTS:  Not performed due to time constraints  GAIT: Distance walked: various clinic distances Assistive device utilized: Single point cane Level of assistance: SBA Comments: antalgic, unsteady, to be further assessed in a functional  context  TREATMENT DATE:    Self-Care/Home Management Printed out patient's note from her last session for her to bring to her [facility] session  TherAct Demonstrated seated R HS stretch with use of gait belt/strap at end of session Printed picture for patient and encouraged her to try it out at home and report back  Physical Performance For STG assessment:  [facility] PT Assessment - 12/22/23 1038       Ambulation/Gait   Gait velocity 32.8 ft over 16 sec = 2.05 ft/sec      Standardized Balance Assessment   Standardized Balance  Assessment Timed Up and Go Test;Five Times Sit to Stand    Five times sit to stand comments  25.75 sec   no UE     Timed Up and Go Test   TUG Normal TUG    Normal TUG (seconds) 15   with RW     Functional Gait  Assessment   Gait assessed  Yes    Gait Level Surface Walks 20 ft, slow speed, abnormal gait pattern, evidence for imbalance or deviates 10-15 in outside of the 12 in walkway width. Requires more than 7 sec to ambulate 20 ft.    Change in Gait Speed Makes only minor adjustments to walking speed, or accomplishes a change in speed with significant gait deviations, deviates 10-15 in outside the 12 in walkway width, or changes speed but loses balance but is able to recover and continue walking.    Gait with Horizontal Head Turns Performs head turns smoothly with slight change in gait velocity (eg, minor disruption to smooth gait path), deviates 6-10 in outside 12 in walkway width, or uses an assistive device.    Gait with Vertical Head Turns Performs task with slight change in gait velocity (eg, minor disruption to smooth gait path), deviates 6 - 10 in outside 12 in walkway width or uses assistive device    Gait and Pivot Turn Pivot turns safely in greater than 3 sec and stops with no loss of balance, or pivot turns safely within 3 sec and stops with mild imbalance, requires small steps to catch balance.    Step Over Obstacle Cannot perform without assistance.    Gait with Narrow Base of Support Ambulates less than 4 steps heel to toe or cannot perform without assistance.    Gait with Eyes Closed Walks 20 ft, slow speed, abnormal gait pattern, evidence for imbalance, deviates 10-15 in outside 12 in walkway width. Requires more than 9 sec to ambulate 20 ft.    Ambulating Backwards Walks 20 ft, slow speed, abnormal gait pattern, evidence for imbalance, deviates 10-15 in outside 12 in walkway width.    Steps Two feet to a stair, must use rail.    Total Score 11    FGA comment: 11/30                                                                                                                      Pt  does have gradual onset of pain and numbness starting in her R foot gradually that works its way up her R leg and is mainly between her R knee and R hip in her posterior thigh region during functional outcome measure assessments.    PATIENT EDUCATION:  Education details: continue HEP and added to HEP, speak to referring provider at appt tomorrow about symptoms Person educated: Patient Education method: Explanation, Demonstration, Tactile cues, Verbal cues, and Handouts Education comprehension: verbalized understanding, returned demonstration, and needs further education  HOME EXERCISE PROGRAM: Access Code: [email or web address] URL: [email or web address] Date: 12/10/2023 Prepared by: Waddell Southgate  Exercises - Supine Figure 4 Piriformis Stretch  - 1 x daily - 7 x weekly - 1 sets - 3-5 reps - 30 sec hold - Supine Lower Trunk Rotation  - 1 x daily - 7 x weekly - 1 sets - 3-5 reps - 30 sec hold - Seated Hamstring Stretch with Strap  - 1 x daily - 7 x weekly - 1 sets - 3-5 reps - 30 sec hold  ASSESSMENT:  CLINICAL IMPRESSION: Emphasis of skilled PT session on assessing STG, continuing to discuss her ongoing pain and symptoms, and adding HS stretch to her HEP. She has met 4/4 STG due to being independent with her initial HEP, improving her functional strength with an improved 5xSTS score, improving her balance and decreasing her fall risk with an improved TUG score, and completing the FGA so that a LTG could be set for improving this. She did increase her gait speed though not enough to meet her STG. Additionally, initial assessments were performed with no AD and assessments were performed with her RW this date (except for FGA) as she now requires a RW for community mobility for increased safety and independence and in order to not flare up her pain symptoms. She does have an increase  in her pain after participating in functional assessments this session. She continues to have a worsening of numbness in her RLE and an increase in pain with mobility. She has a follow-up with her referring provider tomorrow, encouraged her to speak to them about her ongoing symptoms to assist with determining the plan for treatment going forwards. Continue POC.    OBJECTIVE IMPAIRMENTS: Abnormal gait, decreased cognition, decreased knowledge of condition, decreased knowledge of use of DME, decreased mobility, difficulty walking, decreased ROM, decreased strength, impaired vision/preception, improper body mechanics, postural dysfunction, and pain.   ACTIVITY LIMITATIONS: carrying, lifting, bending, sitting, standing, squatting, stairs, transfers, and bed mobility  PARTICIPATION LIMITATIONS: meal prep, cleaning, laundry, driving, and community activity  PERSONAL FACTORS: Age and 3+ comorbidities:  anxiety, DM, pacemaker, GERD, B glaucoma, HTN, RA, lumbar spinal stenosis, syncopeare also affecting patient's functional outcome.   REHAB POTENTIAL: Good  CLINICAL DECISION MAKING: Evolving/moderate complexity  EVALUATION COMPLEXITY: Moderate   GOALS: Goals reviewed with patient? Yes  SHORT TERM GOALS: Target date: 12/23/2023   Pt will be independent with initial HEP for improved strength, balance, transfers and gait and increased independence with management of her pain symptoms. Baseline: Goal status: MET  2.  Pt will improve 5 x STS to less than or equal to 26 sec seconds to demonstrate improved functional strength and transfer efficiency.  Baseline: 29.47 sec no UE (8/13), 25.75 sec no UE (8/25) Goal status: MET  3.  Pt will improve normal TUG to less than or equal to 18 seconds for improved functional mobility and decreased fall risk. Baseline: 21.34 sec no AD (8/13), 15 sec RW (8/25) Goal  status: MET  4.  Pt will improve gait velocity to at least 2.25 ft/sec for improved gait  efficiency and performance at mod I level  Baseline: 1.9 ft/sec no AD mod I (8/13), 2.05 ft/sec RW mod I (8/25) Goal status: IN PROGRESS  5.  FGA to be assessed and LTG set if appropriate Baseline: 11/30 (8/25) Goal status: MET   LONG TERM GOALS: Target date: 01/13/2024   Pt will be independent with final HEP for improved strength, balance, transfers and gait and increased independence with management of her pain symptoms. Baseline:  Goal status: INITIAL  2.  Pt will improve 5 x STS to less than or equal to 23 sec seconds to demonstrate improved functional strength and transfer efficiency.  Baseline: 29.47 sec no UE (8/13), 25.75 sec no UE (8/25) Goal status: INITIAL  3.  Pt will improve normal TUG to less than or equal to 13 seconds for improved functional mobility and decreased fall risk. Baseline: 21.34 sec no AD (8/13), 15 sec RW (8/25) Goal status: REVISED/UPGRADED  4.  Pt will improve gait velocity to at least 2.25 ft/sec for improved gait efficiency and performance at mod I level  Baseline: 1.9 ft/sec no AD mod I (8/13), 2.05 ft/sec RW mod I (8/25) Goal status: INITIAL  5.  Pt will improve FGA to 15/30 for decreased fall risk  Baseline: 11/30 (8/25) Goal status: INITIAL  6.  Pt will improve her lumbar ROM by 25% in limited direction with no increase in pain. Baseline: see eval Goal status: INITIAL  7.  Pt will improve her score on the Oswestry to </= 24/50 to demonstrate decreased disability level. Baseline: 30/50 - severe disability (8/4) Goal status: INITIAL  PLAN:  PT FREQUENCY: 1-2x/week  PT DURATION: 6 weeks  PLANNED INTERVENTIONS: 02835- PT Re-evaluation, 97750- Physical Performance Testing, 97110-Therapeutic exercises, 97530- Therapeutic activity, 97112- Neuromuscular re-education, 97535- Self Care, 02859- Manual therapy, [phone]- Gait training, [phone]- Aquatic Therapy, [phone] (1-2 muscles), [phone] (3+ muscles)- Dry Needling, Patient/Family education, Balance  training, Stair training, Taping, Joint mobilization, Spinal mobilization, Cognitive remediation, DME instructions, Cryotherapy, and Moist heat.  PLAN FOR NEXT SESSION: add to HEP to work on core strengthening/stability, work on addressing balance impairments, anything to address RLE pain?, lifting techniques, how are symptoms? How was visit with [facility] 8/26?   Christina Gintz, PT Waddell Southgate, PT, DPT, CSRS  12/22/2023, 11:04 AM

## 2023-12-23 DIAGNOSIS — M5416 Radiculopathy, lumbar region: Secondary | ICD-10-CM | POA: Diagnosis not present

## 2023-12-25 ENCOUNTER — Ambulatory Visit: Payer: Self-pay | Admitting: Physical Therapy

## 2023-12-25 DIAGNOSIS — R2681 Unsteadiness on feet: Secondary | ICD-10-CM

## 2023-12-25 DIAGNOSIS — M79604 Pain in right leg: Secondary | ICD-10-CM | POA: Diagnosis not present

## 2023-12-25 DIAGNOSIS — R2689 Other abnormalities of gait and mobility: Secondary | ICD-10-CM | POA: Diagnosis not present

## 2023-12-25 DIAGNOSIS — M6281 Muscle weakness (generalized): Secondary | ICD-10-CM

## 2023-12-25 DIAGNOSIS — M5459 Other low back pain: Secondary | ICD-10-CM | POA: Diagnosis not present

## 2023-12-25 NOTE — Therapy (Signed)
 OUTPATIENT PHYSICAL THERAPY THORACOLUMBAR TREATMENT   Patient Name: Charlene Barker MRN: [id number] DOB:1946-01-04, 78 y.o., female Today's Date: 12/25/2023  END OF SESSION:  PT End of Session - 12/25/23 1321     Visit Number 5    Number of Visits 13   recert   Date for PT Re-Evaluation 01/26/24    Authorization Type Medicare    PT Start Time 1320    PT Stop Time 1405    PT Time Calculation (min) 45 min    Equipment Utilized During Treatment --    Activity Tolerance Patient tolerated treatment well    Behavior During Therapy WFL for tasks assessed/performed             Past Medical History:  Diagnosis Date   Anxiety    Atrioventricular block, complete -intermittent        Complete heart block (HCC) 04/14/2012   Diabetes mellitus without complication (HCC)    per patient.    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   Pacemaker    St Jude   Paresthesia of both legs 03/02/2015   Rheumatoid arthritis (HCC)    Sinus node dysfunction (HCC) 02/17/2019   Spinal stenosis of lumbar region 03/02/2015   L4-5   Syncope    Past Surgical History:  Procedure Laterality Date   BIOPSY OF SKIN SUBCUTANEOUS TISSUE AND/OR MUCOUS MEMBRANE  07/25/2023   Procedure: BIOPSY, gastric;  Surgeon: Rollin Dover, MD;  Location: [facility];  Service: Gastroenterology;;   BLADDER SURGERY     sling   COLONOSCOPY WITH PROPOFOL  N/A 07/25/2023   Procedure: COLONOSCOPY WITH PROPOFOL ;  Surgeon: Rollin Dover, MD;  Location: [facility];  Service: Gastroenterology;  Laterality: N/A;   ESOPHAGOGASTRODUODENOSCOPY (EGD) WITH PROPOFOL  N/A 07/25/2023   Procedure: ESOPHAGOGASTRODUODENOSCOPY (EGD) WITH PROPOFOL ;  Surgeon: Rollin Dover,  MD;  Location: [facility];  Service: Gastroenterology;  Laterality: N/A;   EYE SURGERY     several, bilateral   HOT HEMOSTASIS N/A 07/25/2023   Procedure: , WITH ARGON PLASMA COAGULATION;  Surgeon: Rollin Dover, MD;  Location: [facility];  Service: Gastroenterology;  Laterality: N/A;   LEAD REVISION/REPAIR N/A 05/14/2023   Procedure: LEAD REVISION/REPAIR;  Surgeon: Nancey, Eulas BRAVO, MD;  Location: [facility];  Service: Cardiovascular;  Laterality: N/A;   LOOP RECORDER IMPLANT N/A 09/20/2011   Procedure: LOOP RECORDER IMPLANT;  Surgeon: Elspeth JAYSON Sage, MD;  Location: [facility];  Service: Cardiovascular;  Laterality: N/A;   PERMANENT PACEMAKER INSERTION N/A 04/15/2012   POLYPECTOMY  07/25/2023   Procedure: POLYPECTOMY;  Surgeon: Rollin Dover, MD;  Location: [facility];  Service: Gastroenterology;;   OSCAR GENERATOR CHANGEOUT N/A 05/14/2023   Procedure: PPM GENERATOR CHANGEOUT;  Surgeon: Nancey Eulas BRAVO, MD;  Location: [facility];  Service: Cardiovascular;  Laterality: N/A;   Patient Active Problem List   Diagnosis Date Noted   Sensorineural hearing loss, bilateral 07/08/2023   Chronic rhinitis 07/08/2023   Deviated nasal septum 07/08/2023   Hypertrophy of nasal turbinates 07/08/2023   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   Near syncope 05/13/2023   Sinus node dysfunction (HCC) 02/17/2019   Palpitations 02/17/2019   Encounter for care of  pacemaker 10/27/2018   Tremor 07/18/2016   Paresthesia 03/02/2015   Spinal stenosis of lumbar region 03/02/2015   Other specified transient cerebral ischemias 12/27/2014   Mechanical complication of cardiac pacemaker electrode 08/27/2013   Anxiety 08/13/2012   Pacemaker--St Jude Acc[employer] DR 2110 dual chamber pacemaker12/18/2013 04/15/2012   Complete heart block (HCC) 04/14/2012   Syncope    Left bundle branch block    Dizziness     PCP: Shayne Anes, MD  REFERRING PROVIDER: Candance Jeoffrey SAILOR, PA-C  at [facility]  REFERRING DIAG: M54.50 (ICD-10-CM) - Low back pain, unspecified  Rationale for Evaluation and Treatment: Rehabilitation  THERAPY DIAG:  Muscle weakness (generalized)  Other abnormalities of gait and mobility  Unsteadiness on feet  ONSET DATE: 11/25/2023 (referral date)  SUBJECTIVE:                                                                                                                                                                                           SUBJECTIVE STATEMENT: Pt presents w/RW. States she saw pain management PA at [facility] yesterday and he recommended pt use lidocaine  patches, but pt does not feel like this is safe with her pacemaker. PA also recommending MRI, but pt states she does not think she can have this because of her pacemaker.   Was upset with previous therapist because she did not think she was working hard enough to get better. Pt reports last time she had pain, it went away with aggressive therapy. Has a referral to go back to [facility] for PT but wanted to see what therapist had to say today.   From Eval: Pt enters clinic with [facility], husband waits in lobby. She reports that on 11/18/23 she turned the wrong way trying to get into bed and twisted or tweaked her back. She reports that initially the pain was localized to her R side low back but now is in her R hip, her R outer thigh, all the way down to her R knee. Her pain has improved since her initial injury except for the progression down her leg. She went to [facility] and reports they took xrays, showed she has scoliosis and degeneration in her spine. She reports that she feels the pain more if she turns too quickly or the wrong way, makes doing housework difficult. She reports that she can tolerate sleeping on her R or her L side, cannot tolerate sleeping on her back. Her pain does improve if she uses a walker or holds onto a shopping cart but she feels that the walker is  overall cumbersome and prefers not to use it.  Of note, pt also reports that she has glaucoma and that her vision is very impaired. She also reports having memory issues.   PERTINENT HISTORY:  PMH: anxiety, DM, pacemaker, GERD, B glaucoma, HTN, RA, lumbar spinal stenosis, syncope  PAIN:  Are you having pain? Yes: NPRS scale: 4/10 Pain location: R lower back into RLE to her knee, hurts in R lateral thigh Pain description: radiates down R leg but no N/T Aggravating factors: standing and walking Relieving factors: sitting and laying down  PRECAUTIONS: Fall and ICD/Pacemaker  RED FLAGS: None   WEIGHT BEARING RESTRICTIONS: No  FALLS:  Has patient fallen in last 6 months? No  LIVING ENVIRONMENT: Lives with: lives with their spouse [name] Lives in: House/apartment Stairs: Yes: External: 3 steps; none Has following equipment at home: Single point cane, Walker - 2 wheeled, and transport chair  OCCUPATION: retired  PLOF: Independent with gait, Independent with transfers, and Requires assistive device for independence  PATIENT GOALS: been trying to do some therapy on my own in the bed before getting up and it has helped a bit - unable to state a specific goal  NEXT MD VISIT: just saw them again last week - unable to provide next appt date  OBJECTIVE:  Note: Objective measures were completed at Evaluation unless otherwise noted.  DIAGNOSTIC FINDINGS:  From referring provider note: reviewed/interpreted ap/lateral x-rays of lumbar spine showing degenerative scoliosis with multilevel disk degeneration, no fractures  PATIENT SURVEYS:  Oswestry: 30/50  COGNITION: Overall cognitive status: History of cognitive impairments - at baseline, memory impairments per patient     SENSATION: WFL  MUSCLE LENGTH: Hamstrings: tight HS  POSTURE: rounded shoulders, forward head, posterior pelvic tilt, and scoliosis  PALPATION: Not performed due to time constraints  LUMBAR ROM:    AROM eval  Flexion 50%, stretch on R side  Extension 50%, immediate pain down R side  Right lateral flexion 25%, pain  Left lateral flexion 50%, stretch on R side  Right rotation 50%, pain  Left rotation 25%, major pain   (Blank rows = not tested)  LOWER EXTREMITY ROM:   WFL  Active  Right eval Left eval  Hip flexion    Hip extension    Hip abduction    Hip adduction    Hip internal rotation    Hip external rotation    Knee flexion    Knee extension    Ankle dorsiflexion    Ankle plantarflexion    Ankle inversion    Ankle eversion     (Blank rows = not tested)  LOWER EXTREMITY MMT:    MMT Right eval Left eval  Hip flexion 5 4  Hip extension    Hip abduction    Hip adduction    Hip internal rotation    Hip external rotation    Knee flexion 5 4  Knee extension 5 4  Ankle dorsiflexion 5 5  Ankle plantarflexion    Ankle inversion    Ankle eversion     (Blank rows = not tested)  LUMBAR SPECIAL TESTS:  Not performed due to time constraints  FUNCTIONAL TESTS:  Not performed due to time constraints  GAIT: Distance walked: various clinic distances Assistive device utilized: Single point cane Level of assistance: SBA Comments: antalgic, unsteady, to be further assessed in a functional context  TREATMENT DATE:    TherAct Lengthy discussion regarding etiology of radiculopathy, importance of working on stability and mobility and importance of continuing her HEP even after DC from PT.  Pt reports that last time she had pain, her exercises fixed things but now her pain is back w/ vengeance. Pt states she stopped doing her exercises when her pain reduced and she is now very weak. Educated pt on importance of resistance training, core stability and pelvic stability to support the spine and facilitate pain-free movement. Pt reports she did not know she was supposed to continue her HEP after her pain stopped. Pt upset w/previous therapist due to not pushing hard  enough, so informed pt that the goal of PT initially is to facilitate movement in a pain-free range, build up stability and then work on painful movements slowly. Informed pt there is no quick fix for her pain and these are exercises she will need to continue even after DC from PT. Pt verbalized understanding.  Pt reports she searched exercises on YouTube and tried them yesterday, thinks she did them wrong and would like to review them:  Quadruped cat cows, x8 reps. Pt unable to facilitate any lumbar extension due to pain and has very minimal lumbar flexion w/movement. Provided max multimodal cues, but pt unable to coordinate movement well. Recommended pt hold off on this exercise for now  Supine knees to chest stretch, x30s. Pt reports she has no pain w/this and is not difficult, so informed pt she does not need to work on this currently.  The following exercises were performed for improved posterior chain strength, core stability, pelvic floor activation and facilitation of gentle lumbar extension:  Seated deadlifts w/12# KB, x8 reps. Pt required max encouragement to perform w/12# as she is too weak and weight is too heavy. Educated pt on importance of resistance training and building up muscle mass for overall mobility and independence and pt then able to perform 8 reps w/proper form and no pain reported. Cued pt to maintain forward gaze on object on floor to promote neutral spine position. Also cued pt for proper breathing technique throughout to add in core stability, as pt tends to hold her breath  PPT w/kegel, x10 reps w/5s isometric hold. Pt reports this movement helps reduce her pain, but does not last long. Encouraged pt to work on this in seated and standing position as well to work on proper engagement of pelvic floor and core. Added to HEP (see bolded below)  Supine glute bridge w/pelvic floor contraction, x15 reps w/5s isometric hold. Pt had difficulty maintaining pelvic floor contraction  w/movement. Added to HEP (see bolded below)  Seated pallof presses w/red resistance band, x10 reps per side. Increased difficulty when band is anchored to L side due to decreased R paraspinal and multifidus weakness. Pt reported peripheralization of pain when band anchored to L side but centralization of pain when anchored to R side.     PATIENT EDUCATION:  Education details: Updates to HEP, see ther act above  Person educated: Patient Education method: Explanation, Demonstration, Tactile cues, Verbal cues, and Handouts Education comprehension: verbalized understanding, returned demonstration, verbal cues required, tactile cues required, and needs further education  HOME EXERCISE PROGRAM: Access Code: [email or web address] URL: [email or web address] Date: 12/10/2023 Prepared by: Waddell Southgate  Exercises - Supine Figure 4 Piriformis Stretch  - 1 x daily - 7 x weekly - 1 sets - 3-5 reps - 30 sec hold - Supine Lower Trunk Rotation  - 1 x daily - 7 x weekly - 1 sets - 3-5 reps - 30 sec hold - Seated Hamstring Stretch with Strap  - 1 x daily - 7 x weekly -  1 sets - 3-5 reps - 30 sec hold - Supine Pelvic Floor Contraction  - 1 x daily - 7 x weekly - 3 sets - 10 reps - Supine Bridge with Pelvic Floor Contraction  - 1 x daily - 7 x weekly - 3 sets - 10 reps  ASSESSMENT:  CLINICAL IMPRESSION: Emphasis of skilled PT session on discussing recent visit to [facility], pelvic floor contraction and facilitation of gentle lumbar extension. Pt reports she was unhappy w/previous therapist due to not getting better, so informed pt she only had two sessions and most were spent providing education and encouragement to go see doctor as her pain worsened. Informed pt there is no quick fix for her pain and she will need to work on core and pelvic floor stability prior to adding in mobility work. Pt also very weak as she does not exercise and has difficulty activating her posterior chain, so will benefit  from resistance training. Continue POC.    OBJECTIVE IMPAIRMENTS: Abnormal gait, decreased cognition, decreased knowledge of condition, decreased knowledge of use of DME, decreased mobility, difficulty walking, decreased ROM, decreased strength, impaired vision/preception, improper body mechanics, postural dysfunction, and pain.   ACTIVITY LIMITATIONS: carrying, lifting, bending, sitting, standing, squatting, stairs, transfers, and bed mobility  PARTICIPATION LIMITATIONS: meal prep, cleaning, laundry, driving, and community activity  PERSONAL FACTORS: Age and 3+ comorbidities:  anxiety, DM, pacemaker, GERD, B glaucoma, HTN, RA, lumbar spinal stenosis, syncopeare also affecting patient's functional outcome.   REHAB POTENTIAL: Good  CLINICAL DECISION MAKING: Evolving/moderate complexity  EVALUATION COMPLEXITY: Moderate   GOALS: Goals reviewed with patient? Yes  SHORT TERM GOALS: Target date: 12/23/2023   Pt will be independent with initial HEP for improved strength, balance, transfers and gait and increased independence with management of her pain symptoms. Baseline: Goal status: MET  2.  Pt will improve 5 x STS to less than or equal to 26 sec seconds to demonstrate improved functional strength and transfer efficiency.  Baseline: 29.47 sec no UE (8/13), 25.75 sec no UE (8/25) Goal status: MET  3.  Pt will improve normal TUG to less than or equal to 18 seconds for improved functional mobility and decreased fall risk. Baseline: 21.34 sec no AD (8/13), 15 sec RW (8/25) Goal status: MET  4.  Pt will improve gait velocity to at least 2.25 ft/sec for improved gait efficiency and performance at mod I level  Baseline: 1.9 ft/sec no AD mod I (8/13), 2.05 ft/sec RW mod I (8/25) Goal status: IN PROGRESS  5.  FGA to be assessed and LTG set if appropriate Baseline: 11/30 (8/25) Goal status: MET   LONG TERM GOALS: Target date: 01/13/2024   Pt will be independent with final HEP for  improved strength, balance, transfers and gait and increased independence with management of her pain symptoms. Baseline:  Goal status: INITIAL  2.  Pt will improve 5 x STS to less than or equal to 23 sec seconds to demonstrate improved functional strength and transfer efficiency.  Baseline: 29.47 sec no UE (8/13), 25.75 sec no UE (8/25) Goal status: INITIAL  3.  Pt will improve normal TUG to less than or equal to 13 seconds for improved functional mobility and decreased fall risk. Baseline: 21.34 sec no AD (8/13), 15 sec RW (8/25) Goal status: REVISED/UPGRADED  4.  Pt will improve gait velocity to at least 2.25 ft/sec for improved gait efficiency and performance at mod I level  Baseline: 1.9 ft/sec no AD mod I (8/13), 2.05  ft/sec RW mod I (8/25) Goal status: INITIAL  5.  Pt will improve FGA to 15/30 for decreased fall risk  Baseline: 11/30 (8/25) Goal status: INITIAL  6.  Pt will improve her lumbar ROM by 25% in limited direction with no increase in pain. Baseline: see eval Goal status: INITIAL  7.  Pt will improve her score on the Oswestry to </= 24/50 to demonstrate decreased disability level. Baseline: 30/50 - severe disability (8/4) Goal status: INITIAL  PLAN:  PT FREQUENCY: 1-2x/week  PT DURATION: 6 weeks  PLANNED INTERVENTIONS: 02835- PT Re-evaluation, 97750- Physical Performance Testing, 97110-Therapeutic exercises, 97530- Therapeutic activity, 97112- Neuromuscular re-education, 97535- Self Care, 02859- Manual therapy, [phone]- Gait training, [phone]- Aquatic Therapy, [phone] (1-2 muscles), [phone] (3+ muscles)- Dry Needling, Patient/Family education, Balance training, Stair training, Taping, Joint mobilization, Spinal mobilization, Cognitive remediation, DME instructions, Cryotherapy, and Moist heat.  PLAN FOR NEXT SESSION: add to HEP to work on core strengthening/stability, work on addressing balance impairments, anything to address RLE pain?, lifting techniques, how are  symptoms?    Dearion Huot E Thekla Colborn, PT, DPT 12/25/2023, 3:57 PM

## 2023-12-30 ENCOUNTER — Ambulatory Visit: Payer: Self-pay | Attending: Physician Assistant | Admitting: Physical Therapy

## 2023-12-30 DIAGNOSIS — M5459 Other low back pain: Secondary | ICD-10-CM | POA: Diagnosis not present

## 2023-12-30 DIAGNOSIS — M6281 Muscle weakness (generalized): Secondary | ICD-10-CM | POA: Diagnosis not present

## 2023-12-30 DIAGNOSIS — R2681 Unsteadiness on feet: Secondary | ICD-10-CM | POA: Insufficient documentation

## 2023-12-30 DIAGNOSIS — M79604 Pain in right leg: Secondary | ICD-10-CM | POA: Insufficient documentation

## 2023-12-30 DIAGNOSIS — R2689 Other abnormalities of gait and mobility: Secondary | ICD-10-CM | POA: Insufficient documentation

## 2023-12-30 NOTE — Therapy (Signed)
 OUTPATIENT PHYSICAL THERAPY THORACOLUMBAR TREATMENT   Patient Name: Charlene Barker MRN: [id number] DOB:1946/03/01, 78 y.o., female Today's Date: 12/30/2023  END OF SESSION:  PT End of Session - 12/30/23 1104     Visit Number 6    Number of Visits 13   recert   Date for PT Re-Evaluation 01/26/24    Authorization Type Medicare    PT Start Time 1101    PT Stop Time 1146    PT Time Calculation (min) 45 min    Activity Tolerance Patient limited by pain    Behavior During Therapy [facility] for tasks assessed/performed             Past Medical History:  Diagnosis Date   Anxiety    Atrioventricular block, complete -intermittent        Complete heart block (HCC) 04/14/2012   Diabetes mellitus without complication (HCC)    per patient.    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   Pacemaker    St Jude   Paresthesia of both legs 03/02/2015   Rheumatoid arthritis (HCC)    Sinus node dysfunction (HCC) 02/17/2019   Spinal stenosis of lumbar region 03/02/2015   L4-5   Syncope    Past Surgical History:  Procedure Laterality Date   BIOPSY OF SKIN SUBCUTANEOUS TISSUE AND/OR MUCOUS MEMBRANE  07/25/2023   Procedure: BIOPSY, gastric;  Surgeon: Rollin Dover, MD;  Location: [facility];  Service: Gastroenterology;;   BLADDER SURGERY     sling   COLONOSCOPY WITH PROPOFOL  N/A 07/25/2023   Procedure: COLONOSCOPY WITH PROPOFOL ;  Surgeon: Rollin Dover, MD;  Location: [facility];  Service: Gastroenterology;  Laterality: N/A;   ESOPHAGOGASTRODUODENOSCOPY (EGD) WITH PROPOFOL  N/A 07/25/2023   Procedure: ESOPHAGOGASTRODUODENOSCOPY (EGD) WITH PROPOFOL ;  Surgeon: Rollin Dover, MD;  Location: [facility];  Service:  Gastroenterology;  Laterality: N/A;   EYE SURGERY     several, bilateral   HOT HEMOSTASIS N/A 07/25/2023   Procedure: , WITH ARGON PLASMA COAGULATION;  Surgeon: Rollin Dover, MD;  Location: [facility];  Service: Gastroenterology;  Laterality: N/A;   LEAD REVISION/REPAIR N/A 05/14/2023   Procedure: LEAD REVISION/REPAIR;  Surgeon: Nancey, Eulas BRAVO, MD;  Location: [facility];  Service: Cardiovascular;  Laterality: N/A;   LOOP RECORDER IMPLANT N/A 09/20/2011   Procedure: LOOP RECORDER IMPLANT;  Surgeon: Elspeth JAYSON Sage, MD;  Location: [facility] CATH LAB;  Service: Cardiovascular;  Laterality: N/A;   PERMANENT PACEMAKER INSERTION N/A 04/15/2012   POLYPECTOMY  07/25/2023   Procedure: POLYPECTOMY;  Surgeon: Rollin Dover, MD;  Location: [facility];  Service: Gastroenterology;;   OSCAR GENERATOR CHANGEOUT N/A 05/14/2023   Procedure: PPM GENERATOR CHANGEOUT;  Surgeon: Nancey Eulas BRAVO, MD;  Location: [facility];  Service: Cardiovascular;  Laterality: N/A;   Patient Active Problem List   Diagnosis Date Noted   Sensorineural hearing loss, bilateral 07/08/2023   Chronic rhinitis 07/08/2023   Deviated nasal septum 07/08/2023   Hypertrophy of nasal turbinates 07/08/2023   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   Near syncope 05/13/2023   Sinus node dysfunction (HCC) 02/17/2019   Palpitations 02/17/2019   Encounter for care of pacemaker 10/27/2018   Tremor 07/18/2016  Paresthesia 03/02/2015   Spinal stenosis of lumbar region 03/02/2015   Other specified transient cerebral ischemias 12/27/2014   Mechanical complication of cardiac pacemaker electrode 08/27/2013   Anxiety 08/13/2012   Pacemaker--St Jude Accent DR 2110 dual chamber pacemaker12/18/2013 04/15/2012   Complete heart block (HCC) 04/14/2012   Syncope    Left bundle branch block    Dizziness     PCP: Shayne Anes, MD  REFERRING PROVIDER: Candance Jeoffrey SAILOR, PA-C at [facility]  REFERRING DIAG:  M54.50 (ICD-10-CM) - Low back pain, unspecified  Rationale for Evaluation and Treatment: Rehabilitation  THERAPY DIAG:  Muscle weakness (generalized)  Other abnormalities of gait and mobility  Unsteadiness on feet  Pain in right leg  Other low back pain  ONSET DATE: 11/25/2023 (referral date)  SUBJECTIVE:                                                                                                                                                                                           SUBJECTIVE STATEMENT: Pt presents w/RW. States it was a mistake to ask for more intense exercise because she is having a lot more pain now. Pain is constant, pt very tearful during subjective. I am just as upset about the pain as I am about the fact that nothing I do seems to help. Pt reports she cannot do glute bridges without severe pain.    From Eval: Pt enters clinic with [facility], husband waits in lobby. She reports that on 11/18/23 she turned the wrong way trying to get into bed and twisted or tweaked her back. She reports that initially the pain was localized to her R side low back but now is in her R hip, her R outer thigh, all the way down to her R knee. Her pain has improved since her initial injury except for the progression down her leg. She went to [facility] and reports they took xrays, showed she has scoliosis and degeneration in her spine. She reports that she feels the pain more if she turns too quickly or the wrong way, makes doing housework difficult. She reports that she can tolerate sleeping on her R or her L side, cannot tolerate sleeping on her back. Her pain does improve if she uses a walker or holds onto a shopping cart but she feels that the walker is overall cumbersome and prefers not to use it.   Of note, pt also reports that she has glaucoma and that her vision is very impaired. She also reports having memory issues.   PERTINENT HISTORY:  PMH: anxiety, DM, pacemaker, GERD, B  glaucoma, HTN, RA, lumbar spinal stenosis,  syncope  PAIN:  Are you having pain? Yes: NPRS scale: 7-8/10 Pain location: R lower back into RLE to her knee, hurts in R lateral thigh Pain description: radiates down R leg but no N/T Aggravating factors: standing and walking Relieving factors: sitting and laying down  PRECAUTIONS: Fall and ICD/Pacemaker  RED FLAGS: None   WEIGHT BEARING RESTRICTIONS: No  FALLS:  Has patient fallen in last 6 months? No  LIVING ENVIRONMENT: Lives with: lives with their spouse [name] Lives in: House/apartment Stairs: Yes: External: 3 steps; none Has following equipment at home: Single point cane, Walker - 2 wheeled, and transport chair  OCCUPATION: retired  PLOF: Independent with gait, Independent with transfers, and Requires assistive device for independence  PATIENT GOALS: been trying to do some therapy on my own in the bed before getting up and it has helped a bit - unable to state a specific goal  NEXT MD VISIT: just saw them again last week - unable to provide next appt date  OBJECTIVE:  Note: Objective measures were completed at Evaluation unless otherwise noted.  DIAGNOSTIC FINDINGS:  From referring provider note: reviewed/interpreted ap/lateral x-rays of lumbar spine showing degenerative scoliosis with multilevel disk degeneration, no fractures  PATIENT SURVEYS:  Oswestry: 30/50  COGNITION: Overall cognitive status: History of cognitive impairments - at baseline, memory impairments per patient     SENSATION: WFL  MUSCLE LENGTH: Hamstrings: tight HS  POSTURE: rounded shoulders, forward head, posterior pelvic tilt, and scoliosis  PALPATION: Not performed due to time constraints  LUMBAR ROM:   AROM eval  Flexion 50%, stretch on R side  Extension 50%, immediate pain down R side  Right lateral flexion 25%, pain  Left lateral flexion 50%, stretch on R side  Right rotation 50%, pain  Left rotation 25%, major pain   (Blank  rows = not tested)  LOWER EXTREMITY ROM:   WFL  Active  Right eval Left eval  Hip flexion    Hip extension    Hip abduction    Hip adduction    Hip internal rotation    Hip external rotation    Knee flexion    Knee extension    Ankle dorsiflexion    Ankle plantarflexion    Ankle inversion    Ankle eversion     (Blank rows = not tested)  LOWER EXTREMITY MMT:    MMT Right eval Left eval  Hip flexion 5 4  Hip extension    Hip abduction    Hip adduction    Hip internal rotation    Hip external rotation    Knee flexion 5 4  Knee extension 5 4  Ankle dorsiflexion 5 5  Ankle plantarflexion    Ankle inversion    Ankle eversion     (Blank rows = not tested)  LUMBAR SPECIAL TESTS:  Not performed due to time constraints  FUNCTIONAL TESTS:  Not performed due to time constraints  GAIT: Distance walked: various clinic distances Assistive device utilized: Single point cane Level of assistance: SBA Comments: antalgic, unsteady, to be further assessed in a functional context  TREATMENT DATE:    TherAct/Self-care  Majority of session spent providing therapeutic listening as pt recalls all of her health issues this year and impact it has on her mobility. Pt very tearful, stating she is primary caregiver of her husband and he cannot assist at home. Pt states she is to have surgery of her eyes and has had brain fog since having her new pacemaker inserted.  Discussed option to try aquatic therapy, as pt cannot tolerate land therapy well. At this time, pt preferring to cancel all PT appointments until she can see orthopedic doctor and obtain updated imaging.  Reviewed lumbar CT from 2022 w/pt and noted a cyst on R kidney, pt unaware of this. Pt did undergo ultrasound of kidney in 2022 as well, but pt does not remember this. Wrote this down on a sticky note for pt to remember when she sees the doctor.  Encouraged pt to continue performing PPT but do not do movements that are  painful. Verbally reviewed lifting techniques and importance of body mechanics when reaching for reduced pain levels. Unable to have pt demonstrate due to time constraints.     PATIENT EDUCATION:  Education details: See above  Person educated: Patient Education method: Programmer, multimedia, Demonstration, Verbal cues, and Handouts Education comprehension: verbalized understanding, returned demonstration, verbal cues required, and needs further education  HOME EXERCISE PROGRAM: Access Code: [email or web address] URL: [email or web address] Date: 12/10/2023 Prepared by: Waddell Southgate  Exercises - Supine Figure 4 Piriformis Stretch  - 1 x daily - 7 x weekly - 1 sets - 3-5 reps - 30 sec hold - Supine Lower Trunk Rotation  - 1 x daily - 7 x weekly - 1 sets - 3-5 reps - 30 sec hold - Seated Hamstring Stretch with Strap  - 1 x daily - 7 x weekly - 1 sets - 3-5 reps - 30 sec hold - Supine Pelvic Floor Contraction  - 1 x daily - 7 x weekly - 3 sets - 10 reps - Supine Bridge with Pelvic Floor Contraction  - 1 x daily - 7 x weekly - 3 sets - 10 reps  ASSESSMENT:  CLINICAL IMPRESSION: Session limited due to pt's high pain levels. Emphasis of skilled PT session on providing therapeutic listening as pt emotional regarding pain and discussing PT POC. At this time, pt requesting to pause PT until she sees orthopedic doctor and has updated imaging, as her pain continues to worsen and pt cannot tolerate gentle exercise. Offered aquatic therapy, but pt would like to hold off for now. Of note, pt is under high levels of stress and not sleeping well, which is likely contributing to her pain. Pt to call clinic if she would like to return for PT following appointment w/MD. Continue POC.     OBJECTIVE IMPAIRMENTS: Abnormal gait, decreased cognition, decreased knowledge of condition, decreased knowledge of use of DME, decreased mobility, difficulty walking, decreased ROM, decreased strength, impaired vision/preception,  improper body mechanics, postural dysfunction, and pain.   ACTIVITY LIMITATIONS: carrying, lifting, bending, sitting, standing, squatting, stairs, transfers, and bed mobility  PARTICIPATION LIMITATIONS: meal prep, cleaning, laundry, driving, and community activity  PERSONAL FACTORS: Age and 3+ comorbidities:  anxiety, DM, pacemaker, GERD, B glaucoma, HTN, RA, lumbar spinal stenosis, syncopeare also affecting patient's functional outcome.   REHAB POTENTIAL: Good  CLINICAL DECISION MAKING: Evolving/moderate complexity  EVALUATION COMPLEXITY: Moderate   GOALS: Goals reviewed with patient? Yes  SHORT TERM GOALS: Target date: 12/23/2023   Pt will be independent with initial HEP for improved strength, balance, transfers and gait and increased independence with management of her pain symptoms. Baseline: Goal status: MET  2.  Pt will improve 5 x STS to less than or equal to 26 sec seconds to demonstrate improved functional strength and transfer efficiency.  Baseline: 29.47 sec no UE (8/13), 25.75 sec no UE (8/25) Goal status: MET  3.  Pt will improve normal TUG to less  than or equal to 18 seconds for improved functional mobility and decreased fall risk. Baseline: 21.34 sec no AD (8/13), 15 sec RW (8/25) Goal status: MET  4.  Pt will improve gait velocity to at least 2.25 ft/sec for improved gait efficiency and performance at mod I level  Baseline: 1.9 ft/sec no AD mod I (8/13), 2.05 ft/sec RW mod I (8/25) Goal status: IN PROGRESS  5.  FGA to be assessed and LTG set if appropriate Baseline: 11/30 (8/25) Goal status: MET   LONG TERM GOALS: Target date: 01/13/2024   Pt will be independent with final HEP for improved strength, balance, transfers and gait and increased independence with management of her pain symptoms. Baseline:  Goal status: INITIAL  2.  Pt will improve 5 x STS to less than or equal to 23 sec seconds to demonstrate improved functional strength and transfer  efficiency.  Baseline: 29.47 sec no UE (8/13), 25.75 sec no UE (8/25) Goal status: INITIAL  3.  Pt will improve normal TUG to less than or equal to 13 seconds for improved functional mobility and decreased fall risk. Baseline: 21.34 sec no AD (8/13), 15 sec RW (8/25) Goal status: REVISED/UPGRADED  4.  Pt will improve gait velocity to at least 2.25 ft/sec for improved gait efficiency and performance at mod I level  Baseline: 1.9 ft/sec no AD mod I (8/13), 2.05 ft/sec RW mod I (8/25) Goal status: INITIAL  5.  Pt will improve FGA to 15/30 for decreased fall risk  Baseline: 11/30 (8/25) Goal status: INITIAL  6.  Pt will improve her lumbar ROM by 25% in limited direction with no increase in pain. Baseline: see eval Goal status: INITIAL  7.  Pt will improve her score on the Oswestry to </= 24/50 to demonstrate decreased disability level. Baseline: 30/50 - severe disability (8/4) Goal status: INITIAL  PLAN:  PT FREQUENCY: 1-2x/week  PT DURATION: 6 weeks  PLANNED INTERVENTIONS: 02835- PT Re-evaluation, 97750- Physical Performance Testing, 97110-Therapeutic exercises, 97530- Therapeutic activity, 97112- Neuromuscular re-education, 97535- Self Care, 02859- Manual therapy, [phone]- Gait training, [phone]- Aquatic Therapy, [phone] (1-2 muscles), 20561 (3+ muscles)- Dry Needling, Patient/Family education, Balance training, Stair training, Taping, Joint mobilization, Spinal mobilization, Cognitive remediation, DME instructions, Cryotherapy, and Moist heat.  PLAN FOR NEXT SESSION: Updated imaging? add to HEP to work on core strengthening/stability, work on addressing balance impairments, anything to address RLE pain?, lifting techniques, how are symptoms?    Piotr Christopher E Wakisha Alberts, PT, DPT 12/30/2023, 11:54 AM

## 2024-01-01 ENCOUNTER — Ambulatory Visit: Admitting: Physical Therapy

## 2024-01-05 DIAGNOSIS — H401133 Primary open-angle glaucoma, bilateral, severe stage: Secondary | ICD-10-CM | POA: Diagnosis not present

## 2024-01-12 ENCOUNTER — Ambulatory Visit: Admitting: Physical Therapy

## 2024-01-15 ENCOUNTER — Ambulatory Visit: Payer: Self-pay | Admitting: Physical Therapy

## 2024-01-16 DIAGNOSIS — M48061 Spinal stenosis, lumbar region without neurogenic claudication: Secondary | ICD-10-CM | POA: Diagnosis not present

## 2024-01-19 ENCOUNTER — Ambulatory Visit: Admitting: Physical Therapy

## 2024-01-21 DIAGNOSIS — R739 Hyperglycemia, unspecified: Secondary | ICD-10-CM | POA: Diagnosis not present

## 2024-01-21 DIAGNOSIS — E785 Hyperlipidemia, unspecified: Secondary | ICD-10-CM | POA: Diagnosis not present

## 2024-01-21 DIAGNOSIS — Z1212 Encounter for screening for malignant neoplasm of rectum: Secondary | ICD-10-CM | POA: Diagnosis not present

## 2024-01-21 DIAGNOSIS — E559 Vitamin D deficiency, unspecified: Secondary | ICD-10-CM | POA: Diagnosis not present

## 2024-01-21 DIAGNOSIS — D509 Iron deficiency anemia, unspecified: Secondary | ICD-10-CM | POA: Diagnosis not present

## 2024-01-21 DIAGNOSIS — I10 Essential (primary) hypertension: Secondary | ICD-10-CM | POA: Diagnosis not present

## 2024-01-22 ENCOUNTER — Ambulatory Visit: Payer: Self-pay | Admitting: Physical Therapy

## 2024-01-28 DIAGNOSIS — F419 Anxiety disorder, unspecified: Secondary | ICD-10-CM | POA: Diagnosis not present

## 2024-01-28 DIAGNOSIS — E1169 Type 2 diabetes mellitus with other specified complication: Secondary | ICD-10-CM | POA: Diagnosis not present

## 2024-01-28 DIAGNOSIS — Z950 Presence of cardiac pacemaker: Secondary | ICD-10-CM | POA: Diagnosis not present

## 2024-01-28 DIAGNOSIS — F329 Major depressive disorder, single episode, unspecified: Secondary | ICD-10-CM | POA: Diagnosis not present

## 2024-01-28 DIAGNOSIS — H409 Unspecified glaucoma: Secondary | ICD-10-CM | POA: Diagnosis not present

## 2024-01-28 DIAGNOSIS — I442 Atrioventricular block, complete: Secondary | ICD-10-CM | POA: Diagnosis not present

## 2024-01-28 DIAGNOSIS — R42 Dizziness and giddiness: Secondary | ICD-10-CM | POA: Diagnosis not present

## 2024-01-28 DIAGNOSIS — R82998 Other abnormal findings in urine: Secondary | ICD-10-CM | POA: Diagnosis not present

## 2024-01-28 DIAGNOSIS — Z0000 Encounter for general adult medical examination without abnormal findings: Secondary | ICD-10-CM | POA: Diagnosis not present

## 2024-01-28 DIAGNOSIS — E785 Hyperlipidemia, unspecified: Secondary | ICD-10-CM | POA: Diagnosis not present

## 2024-01-28 DIAGNOSIS — Z8673 Personal history of transient ischemic attack (TIA), and cerebral infarction without residual deficits: Secondary | ICD-10-CM | POA: Diagnosis not present

## 2024-01-28 DIAGNOSIS — M8580 Other specified disorders of bone density and structure, unspecified site: Secondary | ICD-10-CM | POA: Diagnosis not present

## 2024-01-28 DIAGNOSIS — I10 Essential (primary) hypertension: Secondary | ICD-10-CM | POA: Diagnosis not present

## 2024-01-30 DIAGNOSIS — Z1231 Encounter for screening mammogram for malignant neoplasm of breast: Secondary | ICD-10-CM | POA: Diagnosis not present

## 2024-01-30 DIAGNOSIS — Z01419 Encounter for gynecological examination (general) (routine) without abnormal findings: Secondary | ICD-10-CM | POA: Diagnosis not present

## 2024-02-05 NOTE — Progress Notes (Signed)
 Remote PPM Transmission

## 2024-02-09 ENCOUNTER — Ambulatory Visit (INDEPENDENT_AMBULATORY_CARE_PROVIDER_SITE_OTHER): Payer: 59

## 2024-02-09 DIAGNOSIS — I442 Atrioventricular block, complete: Secondary | ICD-10-CM

## 2024-02-10 ENCOUNTER — Telehealth: Payer: Self-pay | Admitting: Cardiovascular Disease

## 2024-02-10 NOTE — Telephone Encounter (Signed)
 Returned call to Pt.  Advised that her last transmission had not been received.  Per Pt she has been keeping her transmitter in her den because her remote checks were scheduled for 9 am.  Advised Pt that her remote transmissions transmit in the middle of the night, so she should keep her monitor by her bed or wherever she is sleeping.  Pt states she did not know this.  Had Pt send manual transmission.  This was received.  Await further needs.

## 2024-02-10 NOTE — Telephone Encounter (Signed)
 Pt is requesting a callback regarding her wanting to know if the transmission could be done since she got a notice stating it didn't go through. She stated she'd been at home all day so there was no reason as to why it shouldn't have went through. Please advise

## 2024-02-11 ENCOUNTER — Telehealth: Payer: Self-pay | Admitting: Cardiovascular Disease

## 2024-02-11 LAB — CUP PACEART REMOTE DEVICE CHECK
Battery Remaining Longevity: 115 mo
Battery Remaining Percentage: 95 %
Battery Voltage: 3.01 V
Brady Statistic AP VP Percent: 1.9 %
Brady Statistic AP VS Percent: 1 %
Brady Statistic AS VP Percent: 98 %
Brady Statistic AS VS Percent: 1 %
Brady Statistic RA Percent Paced: 1.7 %
Brady Statistic RV Percent Paced: 99 %
Date Time Interrogation Session: 20251014134133
Implantable Lead Connection Status: 753985
Implantable Lead Connection Status: 753985
Implantable Lead Implant Date: 20131218
Implantable Lead Implant Date: 20250115
Implantable Lead Location: 753859
Implantable Lead Location: 753860
Implantable Lead Model: 1944
Implantable Pulse Generator Implant Date: 20250115
Lead Channel Impedance Value: 440 Ohm
Lead Channel Impedance Value: 490 Ohm
Lead Channel Pacing Threshold Amplitude: 0.5 V
Lead Channel Pacing Threshold Amplitude: 0.625 V
Lead Channel Pacing Threshold Pulse Width: 0.5 ms
Lead Channel Pacing Threshold Pulse Width: 0.5 ms
Lead Channel Sensing Intrinsic Amplitude: 12 mV
Lead Channel Sensing Intrinsic Amplitude: 4.2 mV
Lead Channel Setting Pacing Amplitude: 0.875
Lead Channel Setting Pacing Amplitude: 2 V
Lead Channel Setting Pacing Pulse Width: 0.5 ms
Lead Channel Setting Sensing Sensitivity: 4 mV
Pulse Gen Model: 2272
Pulse Gen Serial Number: 8233198

## 2024-02-11 NOTE — Telephone Encounter (Signed)
 Patient called to advise remote transmission was received and shows normal device transmission. Patient was appreciative of call back.

## 2024-02-11 NOTE — Telephone Encounter (Signed)
 Pt returning offices call from today. I did not see a note. Please advise.

## 2024-02-11 NOTE — Telephone Encounter (Signed)
 Spoke with pt and advised this RN does not see where anyone has tried to contact pt today.  Pt advised device transmission was received per Randall, device nurse note but transmission has not yet been reviewed by provider.  Pt verbalizes understanding and  requests call back once transmission is reviewed.

## 2024-02-12 DIAGNOSIS — M545 Low back pain, unspecified: Secondary | ICD-10-CM | POA: Diagnosis not present

## 2024-02-12 DIAGNOSIS — M25551 Pain in right hip: Secondary | ICD-10-CM | POA: Diagnosis not present

## 2024-02-12 NOTE — Progress Notes (Signed)
 Remote PPM Transmission

## 2024-02-16 DIAGNOSIS — M545 Low back pain, unspecified: Secondary | ICD-10-CM | POA: Diagnosis not present

## 2024-02-16 DIAGNOSIS — M25551 Pain in right hip: Secondary | ICD-10-CM | POA: Diagnosis not present

## 2024-02-23 ENCOUNTER — Ambulatory Visit: Payer: Self-pay | Admitting: Cardiovascular Disease

## 2024-02-24 DIAGNOSIS — M545 Low back pain, unspecified: Secondary | ICD-10-CM | POA: Diagnosis not present

## 2024-02-24 DIAGNOSIS — M25551 Pain in right hip: Secondary | ICD-10-CM | POA: Diagnosis not present

## 2024-03-03 DIAGNOSIS — M25551 Pain in right hip: Secondary | ICD-10-CM | POA: Diagnosis not present

## 2024-03-03 DIAGNOSIS — M545 Low back pain, unspecified: Secondary | ICD-10-CM | POA: Diagnosis not present

## 2024-03-08 ENCOUNTER — Telehealth: Payer: Self-pay | Admitting: Cardiovascular Disease

## 2024-03-08 DIAGNOSIS — Z961 Presence of intraocular lens: Secondary | ICD-10-CM | POA: Diagnosis not present

## 2024-03-08 DIAGNOSIS — H402233 Chronic angle-closure glaucoma, bilateral, severe stage: Secondary | ICD-10-CM | POA: Diagnosis not present

## 2024-03-08 DIAGNOSIS — H2511 Age-related nuclear cataract, right eye: Secondary | ICD-10-CM | POA: Diagnosis not present

## 2024-03-08 NOTE — Telephone Encounter (Signed)
 Pt c/o Shortness Of Breath: STAT if SOB developed within the last 24 hours or pt is noticeably SOB on the phone  1. Are you currently SOB (can you hear that pt is SOB on the phone)?  No   2. How long have you been experiencing SOB?  Occurred on Friday  3. Are you SOB when sitting or when up moving around?  Occurred while patient was at rest.  4. Are you currently experiencing any other symptoms?  High BP but patient doesn't have any readings with her.

## 2024-03-09 NOTE — Telephone Encounter (Signed)
 Remote transmission received. Normal device function. No alerts. Patient has f/u 03/19/24 w/ Dr. Nancey to discuss pts concerns.

## 2024-03-09 NOTE — Telephone Encounter (Signed)
 I spoke with patient.  She reports she has been having shortness of breath at random times.  Feels it is related to her pacemaker.  Had one episode of shortness of breath that woke her up at 3 AM on Friday night.  BP was 168 range at that time.  Has not had shortness of breath since this episode.  No other BP readings available.  Will forward to Dr Nancey and the device team

## 2024-03-09 NOTE — Telephone Encounter (Signed)
 Patient request I call Dr. Shayne (Pts PCP) to give update with pacemaker. Call was placed nd VM on MA VM.

## 2024-03-19 ENCOUNTER — Encounter: Payer: Self-pay | Admitting: Cardiovascular Disease

## 2024-03-19 ENCOUNTER — Ambulatory Visit: Attending: Cardiovascular Disease | Admitting: Cardiovascular Disease

## 2024-03-19 VITALS — BP 132/64 | HR 81 | Wt 131.5 lb

## 2024-03-19 DIAGNOSIS — R0602 Shortness of breath: Secondary | ICD-10-CM

## 2024-03-19 DIAGNOSIS — I442 Atrioventricular block, complete: Secondary | ICD-10-CM | POA: Diagnosis not present

## 2024-03-19 DIAGNOSIS — R5383 Other fatigue: Secondary | ICD-10-CM | POA: Diagnosis not present

## 2024-03-19 NOTE — Progress Notes (Signed)
 Electrophysiology Office Note:    Date:  03/19/2024   ID:  Charlene Barker, Charlene Barker July 09, 1945, MRN [id number]  PCP:  Shayne Anes, MD   [facility] Providers Cardiologist:  Jerel Balding, MD Electrophysiologist:  Eulas FORBES Furbish, MD     Referring MD: Shayne Anes, MD   History of Present Illness:    Charlene Barker is a 77 y.o. female with a medical history significant for  CHB s/p PPM 2013, HTN, DM2, RA, and BPPV who presents for device follow-up.      History of Present Illness Charlene Barker is a 78 year old female with a history of pacemaker placement who presents with concerns about pacemaker-related symptoms.  She underwent pacemaker placement in 2013 following episodes of syncope. In 2021, she experienced a fall due to ventricular oversensing and noise on her ventricular lead, leading to a revision of her lead in January 2025. This involved the replacement of a right ventricle lead and capping of the old lead.  She feels as though the pacemaker is migrating into her left arm, causing discomfort. She describes symptoms of tingling and pain in her left arm, particularly when sleeping on her left side, and notes that her left side is more swollen than usual. She attributes some of these symptoms to the additional lead placed during the recent procedure, which may be causing some vascular congestion.  She also has difficulty breathing, describing it as 'breathing through a cloth' and needing to force herself to take deep breaths. This sensation has persisted since the pacemaker revision. She was sedentary post-procedure due to restrictions on using her left arm, which was in a sling for six to eight weeks.   She continued to have episodes of shortness of breath and is very easily fatigued.  She attributes this to the pacemaker and reports that she noticed that shortly after the pacemaker was placed.        Today, she is at baseline.  EKGs/Labs/Other Studies  Reviewed Today:     Echocardiogram:  TTE January 2025 LVEF 65 to 70%.  Grade 1 diastolic dysfunction.  Normal valvular structure and function   EKG:   EKG Interpretation Date/Time:  Friday March 19 2024 12:35:20 EST Ventricular Rate:  76 PR Interval:  184 QRS Duration:  122 QT Interval:  440 QTC Calculation: 495 R Axis:   270  Text Interpretation: Atrial-sensed ventricular-paced rhythm When compared with ECG of 18-Aug-2023 14:39, Vent. rate has increased BY  10 BPM Confirmed by Furbish Eulas [phone]) on 03/19/2024 12:39:28 PM     Physical Exam:    VS:  BP 132/64   Pulse 81   Wt 131 lb 8 oz (59.6 kg)   SpO2 98%   BMI 22.57 kg/m     Wt Readings from Last 3 Encounters:  03/19/24 131 lb 8 oz (59.6 kg)  10/16/23 123 lb 14.4 oz (56.2 kg)  08/18/23 123 lb 9.6 oz (56.1 kg)     GEN: Well nourished, well developed in no acute distress CARDIAC: RRR, no murmurs, rubs, gallops RESPIRATORY:  Normal work of breathing MUSCULOSKELETAL: no edema    ASSESSMENT & PLAN:     Abbott dual-chamber pacemaker Underlying complete heart block Status post implantation of a new left bundle area RV lead January 2025 The prior RV lead was capped She is pacemaker dependent. I reviewed today's device interrogation.  See Paceart for details  Fatigue, shortness of breath She attributes this to her pacemaker I do not think the pacemaker is  causing her fatigue.  Her QRS morphology is much better than it was prior to the lead revision. Will order TTE to assess LV function.   Signed, Eulas FORBES Furbish, MD  03/19/2024 12:42 PM    [facility]

## 2024-03-19 NOTE — Patient Instructions (Signed)
 Medication Instructions:  Your physician recommends that you continue on your current medications as directed. Please refer to the Current Medication list given to you today.  *If you need a refill on your cardiac medications before your next appointment, please call your pharmacy*  Lab Work: None ordered.  If you have labs (blood work) drawn today and your tests are completely normal, you will receive your results only by: MyChart Message (if you have MyChart) OR A paper copy in the mail If you have any lab test that is abnormal or we need to change your treatment, we will call you to review the results.  Testing/Procedures: Your physician has requested that you have an echocardiogram. Echocardiography is a painless test that uses sound waves to create images of your heart. It provides your doctor with information about the size and shape of your heart and how well your heart's chambers and valves are working. This procedure takes approximately one hour. There are no restrictions for this procedure. Please do NOT wear cologne, perfume, aftershave, or lotions (deodorant is allowed). Please arrive 15 minutes prior to your appointment time.  Please note: We ask at that you not bring children with you during ultrasound (echo/ vascular) testing. Due to room size and safety concerns, children are not allowed in the ultrasound rooms during exams. Our front office staff cannot provide observation of children in our lobby area while testing is being conducted. An adult accompanying a patient to their appointment will only be allowed in the ultrasound room at the discretion of the ultrasound technician under special circumstances. We apologize for any inconvenience.   Follow-Up: At [facility], you and your health needs are our priority.  As part of our continuing mission to provide you with exceptional heart care, our providers are all part of one team.  This team includes your primary  Cardiologist (physician) and Advanced Practice Providers or APPs (Physician Assistants and Nurse Practitioners) who all work together to provide you with the care you need, when you need it.  Your next appointment:   6 months with EP APP

## 2024-03-22 ENCOUNTER — Other Ambulatory Visit (HOSPITAL_COMMUNITY): Payer: Self-pay

## 2024-03-23 ENCOUNTER — Other Ambulatory Visit (HOSPITAL_COMMUNITY): Payer: Self-pay

## 2024-03-24 ENCOUNTER — Other Ambulatory Visit (HOSPITAL_COMMUNITY): Payer: Self-pay

## 2024-03-29 ENCOUNTER — Telehealth: Payer: Self-pay | Admitting: Cardiovascular Disease

## 2024-03-29 NOTE — Telephone Encounter (Signed)
 1. Name of Medication: Bystolic 5 mg  2. How are you currently taking this medication (dosage and times per day)? N/a   3. Are you having a reaction (difficulty breathing--STAT)? no   4. What is your medication issue?   Pt states PCP is wanting to proscribe above med, pt would like to get Dr. Nancey feedback before starting.

## 2024-03-29 NOTE — Telephone Encounter (Signed)
 Spoke with pt who states Dr Shayne recommends pt start Bystolic 5mg  due to elevated systolic BP readings in the 160's.  Dr Shayne recommended pt contact cardiology to ensure this would be an optimal choice.  Pt advised will forward for further review and recommendation but Dr Nancey would not be the provider following her BP as he is the specialist for her heart rhythm and pacemaker.  Pt verbalizes understanding and states she would feel better if she received the OK from cardiology and will not plan to start this medication until she hears back from our office.  Pt thanked CHARITY FUNDRAISER for the call.

## 2024-03-30 NOTE — Telephone Encounter (Signed)
 Spoke with pt and advised she may try Bystolic 5mg  as prescribed by Dr Shayne.  Pt verbalizes understanding and thanked CHARITY FUNDRAISER for the call.

## 2024-04-13 ENCOUNTER — Encounter: Payer: Self-pay | Admitting: Cardiovascular Disease

## 2024-04-28 ENCOUNTER — Ambulatory Visit: Payer: Self-pay | Admitting: Cardiovascular Disease

## 2024-04-28 ENCOUNTER — Ambulatory Visit (HOSPITAL_COMMUNITY)
Admission: RE | Admit: 2024-04-28 | Discharge: 2024-04-28 | Disposition: A | Discharge status: Home / Self Care | Source: Ambulatory Visit | Attending: Cardiovascular Disease | Admitting: Cardiovascular Disease

## 2024-04-28 DIAGNOSIS — I442 Atrioventricular block, complete: Secondary | ICD-10-CM | POA: Diagnosis not present

## 2024-04-28 LAB — ECHOCARDIOGRAM COMPLETE
AR max vel: 1.54 cm2
AV Area VTI: 1.53 cm2
AV Area mean vel: 1.55 cm2
AV Mean grad: 3 mmHg
AV Peak grad: 5.6 mmHg
Ao pk vel: 1.18 m/s
Area-P 1/2: 3.31 cm2
S' Lateral: 2.86 cm

## 2024-04-30 ENCOUNTER — Telehealth (HOSPITAL_BASED_OUTPATIENT_CLINIC_OR_DEPARTMENT_OTHER): Payer: Self-pay | Admitting: *Deleted

## 2024-04-30 NOTE — Telephone Encounter (Signed)
 Dr. Nancey, you recently saw this pt in clinic. Are you able to comment on surgical clearance for upcoming CATARACT EXTRACTION WITH INTRAOCULAR LENS with GONIOTOMY of RIGHT EYE scheduled for 05/06/2024? Please route your response to [facility] PREOP. Thank you!

## 2024-04-30 NOTE — Telephone Encounter (Signed)
"  ° °  Pre-operative Risk Assessment    Patient Name: Charlene Barker  DOB: May 09, 1945 MRN: [id number]   Date of last office visit: 03/19/24 DR. MEALOR Date of next office visit: NONE   Request for Surgical Clearance    Procedure:  CATARACT EXTRACTION WITH INTRAOCULAR LENS 66984/GONIOTOMY 65820 RIGHT EYE   Date of Surgery:  Clearance 05/06/24                                Surgeon:  DR. LYNWOOD LELON. HARRIS Surgeon's Group or Practice Name:  [facility] Phone number:  [phone] Fax number:  [phone]   Type of Clearance Requested:   - Medical    Type of Anesthesia:  RETROBULBAR/IV SEDATION   Additional requests/questions:    Bonney Niels Jest   04/30/2024, 9:53 AM   "

## 2024-05-04 NOTE — Telephone Encounter (Signed)
Patient called to follow-up on the status of her clearance.

## 2024-05-04 NOTE — Telephone Encounter (Signed)
 Attempted to call patient regarding preoperative cardiac evaluation as patient was recently seen in office however Dr. Nancey is unable to comment on her cardiac risk.  I was unable to reach patient, voicemail left per [facility] requesting callback.

## 2024-05-04 NOTE — Telephone Encounter (Signed)
"  ° °  Patient Name: Charlene Barker  DOB: April 01, 1946 MRN: [id number]  Primary Cardiologist: Jerel Balding, MD  Chart reviewed as part of pre-operative protocol coverage.  Patient was recently seen in clinic on 03/19/2024 by Dr. Nancey, she underwent echocardiogram on 04/28/24 that showed normal LVEF 55 to 60% with no regional wall motion abnormalities, no significant valve abnormalities.  Patient was contacted today regarding preoperative cardiac evaluation, she has remained stable from a cardiac standpoint and denies any chest pain or worsening shortness of breath.  She continues to note some discomfort related to her pacemaker site that is unchanged.  She is able to achieve greater than 4 METS of activity however notes that she is currently experiencing severe back pain. Given past medical history and time since last visit, based on ACC/AHA guidelines, Charlene Barker is at acceptable risk for the planned procedure without further cardiovascular testing.   The patient was advised that if she develops new symptoms prior to surgery to contact our office to arrange for a follow-up visit, and she verbalized understanding.  I will route this recommendation to the requesting party via Epic fax function and remove from pre-op  pool.  Please call with questions.  Kamala Kolton D Nereida Schepp, NP 05/04/2024, 11:48 AM  "

## 2024-05-04 NOTE — Telephone Encounter (Signed)
Patient returned call please advise

## 2024-05-08 ENCOUNTER — Other Ambulatory Visit: Payer: Self-pay | Admitting: Cardiology

## 2024-05-10 ENCOUNTER — Ambulatory Visit (INDEPENDENT_AMBULATORY_CARE_PROVIDER_SITE_OTHER): Payer: 59

## 2024-05-10 DIAGNOSIS — I442 Atrioventricular block, complete: Secondary | ICD-10-CM

## 2024-05-11 LAB — CUP PACEART REMOTE DEVICE CHECK
Battery Remaining Longevity: 113 mo
Battery Remaining Percentage: 92 %
Battery Voltage: 3.01 V
Brady Statistic AP VP Percent: 1 %
Brady Statistic AP VS Percent: 1 %
Brady Statistic AS VP Percent: 99 %
Brady Statistic AS VS Percent: 1 %
Brady Statistic RA Percent Paced: 1 %
Brady Statistic RV Percent Paced: 99 %
Date Time Interrogation Session: 20260112040119
Implantable Lead Connection Status: 753985
Implantable Lead Connection Status: 753985
Implantable Lead Implant Date: 20131218
Implantable Lead Implant Date: 20250115
Implantable Lead Location: 753859
Implantable Lead Location: 753860
Implantable Lead Model: 1944
Implantable Pulse Generator Implant Date: 20250115
Lead Channel Impedance Value: 440 Ohm
Lead Channel Impedance Value: 460 Ohm
Lead Channel Pacing Threshold Amplitude: 0.5 V
Lead Channel Pacing Threshold Amplitude: 0.5 V
Lead Channel Pacing Threshold Pulse Width: 0.5 ms
Lead Channel Pacing Threshold Pulse Width: 0.5 ms
Lead Channel Sensing Intrinsic Amplitude: 12 mV
Lead Channel Sensing Intrinsic Amplitude: 3.6 mV
Lead Channel Setting Pacing Amplitude: 0.75 V
Lead Channel Setting Pacing Amplitude: 2 V
Lead Channel Setting Pacing Pulse Width: 0.5 ms
Lead Channel Setting Sensing Sensitivity: 4 mV
Pulse Gen Model: 2272
Pulse Gen Serial Number: 8233198

## 2024-05-14 ENCOUNTER — Ambulatory Visit: Payer: Self-pay | Admitting: Cardiovascular Disease

## 2024-05-14 NOTE — Progress Notes (Signed)
 Remote PPM Transmission
